# Patient Record
Sex: Male | Born: 1955
Health system: Southern US, Community
[De-identification: ages and names within clinical notes are randomized; demographics above are authoritative.]

## PROBLEM LIST (undated history)

## (undated) DIAGNOSIS — T7840XA Allergy, unspecified, initial encounter: Secondary | ICD-10-CM

## (undated) DIAGNOSIS — I10 Essential (primary) hypertension: Secondary | ICD-10-CM

## (undated) DIAGNOSIS — N400 Enlarged prostate without lower urinary tract symptoms: Secondary | ICD-10-CM

## (undated) DIAGNOSIS — IMO0001 Reserved for inherently not codable concepts without codable children: Secondary | ICD-10-CM

## (undated) DIAGNOSIS — M1990 Unspecified osteoarthritis, unspecified site: Secondary | ICD-10-CM

## (undated) DIAGNOSIS — G8929 Other chronic pain: Secondary | ICD-10-CM

## (undated) DIAGNOSIS — Z794 Long term (current) use of insulin: Secondary | ICD-10-CM

## (undated) DIAGNOSIS — E119 Type 2 diabetes mellitus without complications: Secondary | ICD-10-CM

## (undated) DIAGNOSIS — M549 Dorsalgia, unspecified: Secondary | ICD-10-CM

## (undated) DIAGNOSIS — M65312 Trigger thumb, left thumb: Secondary | ICD-10-CM

## (undated) DIAGNOSIS — E785 Hyperlipidemia, unspecified: Secondary | ICD-10-CM

## (undated) HISTORY — PX: LUMBAR LAMINECTOMY: SHX95

## (undated) HISTORY — DX: Allergy, unspecified, initial encounter: T78.40XA

## (undated) HISTORY — DX: Dorsalgia, unspecified: M54.9

## (undated) HISTORY — DX: Benign prostatic hyperplasia without lower urinary tract symptoms: N40.0

## (undated) HISTORY — PX: INGUINAL HERNIA REPAIR: SHX194

## (undated) HISTORY — DX: Essential (primary) hypertension: I10

## (undated) HISTORY — DX: Hyperlipidemia, unspecified: E78.5

## (undated) HISTORY — DX: Other chronic pain: G89.29

## (undated) HISTORY — PX: POLYPECTOMY: SHX149

---

## 1999-08-29 ENCOUNTER — Emergency Department (HOSPITAL_COMMUNITY): Admission: EM | Admit: 1999-08-29 | Discharge: 1999-08-29 | Payer: Self-pay | Admitting: Emergency Medicine

## 1999-08-29 ENCOUNTER — Encounter: Payer: Self-pay | Admitting: Emergency Medicine

## 2002-12-07 ENCOUNTER — Emergency Department (HOSPITAL_COMMUNITY): Admission: EM | Admit: 2002-12-07 | Discharge: 2002-12-07 | Payer: Self-pay | Admitting: Emergency Medicine

## 2004-12-03 ENCOUNTER — Ambulatory Visit: Payer: Self-pay | Admitting: Internal Medicine

## 2005-07-19 DIAGNOSIS — I10 Essential (primary) hypertension: Secondary | ICD-10-CM

## 2005-07-19 HISTORY — DX: Essential (primary) hypertension: I10

## 2006-04-04 ENCOUNTER — Ambulatory Visit: Payer: Self-pay | Admitting: Internal Medicine

## 2006-06-19 HISTORY — PX: COLONOSCOPY: SHX174

## 2006-06-19 LAB — HM COLONOSCOPY: HM Colonoscopy: 4

## 2006-07-01 ENCOUNTER — Ambulatory Visit: Payer: Self-pay | Admitting: Internal Medicine

## 2006-07-04 ENCOUNTER — Ambulatory Visit: Payer: Self-pay | Admitting: Internal Medicine

## 2006-07-04 LAB — CONVERTED CEMR LAB
ALT: 123 units/L — ABNORMAL HIGH (ref 0–40)
AST: 50 units/L — ABNORMAL HIGH (ref 0–37)
Albumin: 3.8 g/dL (ref 3.5–5.2)
Alkaline Phosphatase: 61 units/L (ref 39–117)
BUN: 11 mg/dL (ref 6–23)
Basophils Absolute: 0 10*3/uL (ref 0.0–0.1)
Basophils Relative: 0.2 % (ref 0.0–1.0)
CO2: 25 meq/L (ref 19–32)
Calcium: 9.2 mg/dL (ref 8.4–10.5)
Chloride: 103 meq/L (ref 96–112)
Chol/HDL Ratio, serum: 9.7
Cholesterol: 221 mg/dL (ref 0–200)
Creatinine, Ser: 1 mg/dL (ref 0.4–1.5)
Eosinophil percent: 3.3 % (ref 0.0–5.0)
GFR calc non Af Amer: 84 mL/min
Glomerular Filtration Rate, Af Am: 101 mL/min/{1.73_m2}
Glucose, Bld: 161 mg/dL — ABNORMAL HIGH (ref 70–99)
HCT: 53.9 % — ABNORMAL HIGH (ref 39.0–52.0)
HDL: 22.8 mg/dL — ABNORMAL LOW (ref >39.0)
Hemoglobin: 17.9 g/dL — ABNORMAL HIGH (ref 13.0–17.0)
Hgb A1c MFr Bld: 7.9 % — ABNORMAL HIGH (ref 4.6–6.0)
LDL DIRECT: 167.5 mg/dL
Lymphocytes Relative: 20.7 % (ref 12.0–46.0)
MCHC: 33.3 g/dL (ref 30.0–36.0)
MCV: 90.5 fL (ref 78.0–100.0)
Monocytes Absolute: 0.5 10*3/uL (ref 0.2–0.7)
Monocytes Relative: 5.7 % (ref 3.0–11.0)
Neutro Abs: 5.6 10*3/uL (ref 1.4–7.7)
Neutrophils Relative %: 70.1 % (ref 43.0–77.0)
Platelets: 249 10*3/uL (ref 150–400)
Potassium: 4.1 meq/L (ref 3.5–5.1)
RBC: 5.96 M/uL — ABNORMAL HIGH (ref 4.22–5.81)
RDW: 12.4 % (ref 11.5–14.6)
Sodium: 136 meq/L (ref 135–145)
TSH: 1.81 microintl units/mL (ref 0.35–5.50)
Total Bilirubin: 0.8 mg/dL (ref 0.3–1.2)
Total Protein: 7.1 g/dL (ref 6.0–8.3)
Triglyceride fasting, serum: 225 mg/dL (ref 0–149)
VLDL: 45 mg/dL — ABNORMAL HIGH (ref 0–40)
WBC: 8.1 10*3/uL (ref 4.5–10.5)

## 2006-07-26 ENCOUNTER — Ambulatory Visit: Payer: Self-pay

## 2006-08-15 ENCOUNTER — Ambulatory Visit: Payer: Self-pay | Admitting: Internal Medicine

## 2006-09-15 ENCOUNTER — Ambulatory Visit: Payer: Self-pay | Admitting: Internal Medicine

## 2006-09-30 ENCOUNTER — Ambulatory Visit: Payer: Self-pay | Admitting: Internal Medicine

## 2006-09-30 LAB — CONVERTED CEMR LAB
BUN: 19 mg/dL (ref 6–23)
CO2: 21 meq/L (ref 19–32)
Calcium: 9.4 mg/dL (ref 8.4–10.5)
Chloride: 105 meq/L (ref 96–112)
Creatinine, Ser: 1.09 mg/dL (ref 0.40–1.50)
Glucose, Bld: 97 mg/dL (ref 70–99)
Hgb A1c MFr Bld: 6.9 % — ABNORMAL HIGH (ref 4.6–6.1)
Potassium: 4.2 meq/L (ref 3.5–5.3)
Sodium: 141 meq/L (ref 135–145)

## 2007-10-30 ENCOUNTER — Encounter: Payer: Self-pay | Admitting: Internal Medicine

## 2007-11-21 ENCOUNTER — Ambulatory Visit: Payer: Self-pay | Admitting: Internal Medicine

## 2007-11-21 DIAGNOSIS — E7210 Disorders of sulfur-bearing amino-acid metabolism, unspecified: Secondary | ICD-10-CM

## 2007-11-21 DIAGNOSIS — I10 Essential (primary) hypertension: Secondary | ICD-10-CM | POA: Insufficient documentation

## 2007-11-21 DIAGNOSIS — E119 Type 2 diabetes mellitus without complications: Secondary | ICD-10-CM | POA: Insufficient documentation

## 2007-11-21 HISTORY — DX: Disorders of sulfur-bearing amino-acid metabolism, unspecified: E72.10

## 2007-11-21 LAB — CONVERTED CEMR LAB
Bilirubin Urine: NEGATIVE
Blood in Urine, dipstick: NEGATIVE
Glucose, Urine, Semiquant: >=1000
Ketones, urine, test strip: NEGATIVE
Nitrite: NEGATIVE
Protein, U semiquant: NEGATIVE
Specific Gravity, Urine: 1.01
Urobilinogen, UA: 0.2
WBC Urine, dipstick: NEGATIVE
pH: 5

## 2007-11-28 ENCOUNTER — Encounter (INDEPENDENT_AMBULATORY_CARE_PROVIDER_SITE_OTHER): Payer: Self-pay | Admitting: *Deleted

## 2007-11-28 LAB — CONVERTED CEMR LAB
ALT: 58 units/L — ABNORMAL HIGH (ref 0–53)
AST: 32 units/L (ref 0–37)
BUN: 19 mg/dL (ref 6–23)
Basophils Absolute: 0.1 10*3/uL (ref 0.0–0.1)
Basophils Relative: 0.9 % (ref 0.0–1.0)
CO2: 27 meq/L (ref 19–32)
Calcium: 9.7 mg/dL (ref 8.4–10.5)
Chloride: 103 meq/L (ref 96–112)
Creatinine, Ser: 0.8 mg/dL (ref 0.4–1.5)
Creatinine,U: 272.8 mg/dL
Eosinophils Absolute: 0.3 10*3/uL (ref 0.0–0.7)
Eosinophils Relative: 4.2 % (ref 0.0–5.0)
GFR calc Af Amer: 131 mL/min
GFR calc non Af Amer: 108 mL/min
Glucose, Bld: 178 mg/dL — ABNORMAL HIGH (ref 70–99)
HCT: 45.6 % (ref 39.0–52.0)
Hemoglobin: 16.2 g/dL (ref 13.0–17.0)
Hgb A1c MFr Bld: 9.9 % — ABNORMAL HIGH (ref 4.6–6.0)
Lymphocytes Relative: 34.4 % (ref 12.0–46.0)
MCHC: 35.4 g/dL (ref 30.0–36.0)
MCV: 87.7 fL (ref 78.0–100.0)
Microalb Creat Ratio: 21.3 mg/g (ref 0.0–30.0)
Microalb, Ur: 5.8 mg/dL — ABNORMAL HIGH (ref 0.0–1.9)
Monocytes Absolute: 0.7 10*3/uL (ref 0.1–1.0)
Monocytes Relative: 10.5 % (ref 3.0–12.0)
Neutro Abs: 3.4 10*3/uL (ref 1.4–7.7)
Neutrophils Relative %: 50 % (ref 43.0–77.0)
PSA: 0.4 ng/mL (ref 0.10–4.00)
Platelets: 226 10*3/uL (ref 150–400)
Potassium: 4 meq/L (ref 3.5–5.1)
RBC: 5.2 M/uL (ref 4.22–5.81)
RDW: 12.4 % (ref 11.5–14.6)
Sodium: 138 meq/L (ref 135–145)
TSH: 1.16 microintl units/mL (ref 0.35–5.50)
WBC: 6.9 10*3/uL (ref 4.5–10.5)

## 2007-12-05 ENCOUNTER — Telehealth (INDEPENDENT_AMBULATORY_CARE_PROVIDER_SITE_OTHER): Payer: Self-pay | Admitting: *Deleted

## 2008-02-20 ENCOUNTER — Ambulatory Visit: Payer: Self-pay | Admitting: Internal Medicine

## 2008-02-20 DIAGNOSIS — F17200 Nicotine dependence, unspecified, uncomplicated: Secondary | ICD-10-CM | POA: Insufficient documentation

## 2008-02-20 DIAGNOSIS — N400 Enlarged prostate without lower urinary tract symptoms: Secondary | ICD-10-CM | POA: Insufficient documentation

## 2008-02-22 ENCOUNTER — Encounter (INDEPENDENT_AMBULATORY_CARE_PROVIDER_SITE_OTHER): Payer: Self-pay | Admitting: *Deleted

## 2008-02-22 LAB — CONVERTED CEMR LAB
ALT: 34 units/L (ref 0–53)
AST: 22 units/L (ref 0–37)
BUN: 18 mg/dL (ref 6–23)
Creatinine, Ser: 1.1 mg/dL (ref 0.4–1.5)
Hgb A1c MFr Bld: 7.5 % — ABNORMAL HIGH (ref 4.6–6.0)

## 2008-10-11 ENCOUNTER — Encounter (INDEPENDENT_AMBULATORY_CARE_PROVIDER_SITE_OTHER): Payer: Self-pay | Admitting: *Deleted

## 2008-10-24 ENCOUNTER — Ambulatory Visit: Payer: Self-pay | Admitting: Internal Medicine

## 2008-12-27 ENCOUNTER — Ambulatory Visit: Payer: Self-pay | Admitting: Internal Medicine

## 2008-12-31 ENCOUNTER — Telehealth (INDEPENDENT_AMBULATORY_CARE_PROVIDER_SITE_OTHER): Payer: Self-pay | Admitting: *Deleted

## 2008-12-31 LAB — CONVERTED CEMR LAB
ALT: 46 units/L (ref 0–53)
AST: 30 units/L (ref 0–37)
BUN: 15 mg/dL (ref 6–23)
CO2: 26 meq/L (ref 19–32)
Calcium: 9.2 mg/dL (ref 8.4–10.5)
Chloride: 107 meq/L (ref 96–112)
Cholesterol: 162 mg/dL (ref 0–200)
Creatinine, Ser: 1 mg/dL (ref 0.4–1.5)
Direct LDL: 96.1 mg/dL
GFR calc non Af Amer: 82.76 mL/min (ref >60)
Glucose, Bld: 152 mg/dL — ABNORMAL HIGH (ref 70–99)
HDL: 22.5 mg/dL — ABNORMAL LOW (ref >39.00)
Hgb A1c MFr Bld: 6.9 % — ABNORMAL HIGH (ref 4.6–6.5)
Potassium: 3.7 meq/L (ref 3.5–5.1)
Sodium: 138 meq/L (ref 135–145)
Total CHOL/HDL Ratio: 7
Triglycerides: 337 mg/dL — ABNORMAL HIGH (ref 0.0–149.0)
VLDL: 67.4 mg/dL — ABNORMAL HIGH (ref 0.0–40.0)

## 2009-02-03 ENCOUNTER — Telehealth (INDEPENDENT_AMBULATORY_CARE_PROVIDER_SITE_OTHER): Payer: Self-pay | Admitting: *Deleted

## 2009-06-16 ENCOUNTER — Encounter (INDEPENDENT_AMBULATORY_CARE_PROVIDER_SITE_OTHER): Payer: Self-pay | Admitting: *Deleted

## 2009-10-27 ENCOUNTER — Encounter: Payer: Self-pay | Admitting: Internal Medicine

## 2009-10-28 ENCOUNTER — Ambulatory Visit: Payer: Self-pay | Admitting: Internal Medicine

## 2009-10-28 LAB — CONVERTED CEMR LAB
Bilirubin Urine: NEGATIVE
Blood in Urine, dipstick: NEGATIVE
Glucose, Urine, Semiquant: >=1000
Ketones, urine, test strip: NEGATIVE
Nitrite: NEGATIVE
Protein, U semiquant: NEGATIVE
Specific Gravity, Urine: >=1.03
Urobilinogen, UA: 0.2
WBC Urine, dipstick: NEGATIVE
pH: 5

## 2009-10-30 ENCOUNTER — Telehealth (INDEPENDENT_AMBULATORY_CARE_PROVIDER_SITE_OTHER): Payer: Self-pay | Admitting: *Deleted

## 2009-11-06 LAB — CONVERTED CEMR LAB
BUN: 19 mg/dL (ref 6–23)
Basophils Absolute: 0 10*3/uL (ref 0.0–0.1)
Basophils Relative: 0.3 % (ref 0.0–3.0)
CO2: 29 meq/L (ref 19–32)
Calcium: 10.4 mg/dL (ref 8.4–10.5)
Chloride: 103 meq/L (ref 96–112)
Creatinine, Ser: 1 mg/dL (ref 0.4–1.5)
Eosinophils Absolute: 0.2 10*3/uL (ref 0.0–0.7)
Eosinophils Relative: 4.6 % (ref 0.0–5.0)
GFR calc non Af Amer: 82.51 mL/min (ref >60)
Glucose, Bld: 181 mg/dL — ABNORMAL HIGH (ref 70–99)
HCT: 49.2 % (ref 39.0–52.0)
Hemoglobin: 17.1 g/dL — ABNORMAL HIGH (ref 13.0–17.0)
Hgb A1c MFr Bld: 7.2 % — ABNORMAL HIGH (ref 4.6–6.5)
Lymphocytes Relative: 42.5 % (ref 12.0–46.0)
Lymphs Abs: 2.1 10*3/uL (ref 0.7–4.0)
MCHC: 34.7 g/dL (ref 30.0–36.0)
MCV: 88.3 fL (ref 78.0–100.0)
Monocytes Absolute: 0.5 10*3/uL (ref 0.1–1.0)
Monocytes Relative: 10.7 % (ref 3.0–12.0)
Neutro Abs: 2.1 10*3/uL (ref 1.4–7.7)
Neutrophils Relative %: 41.9 % — ABNORMAL LOW (ref 43.0–77.0)
PSA: 0.77 ng/mL (ref 0.10–4.00)
Platelets: 255 10*3/uL (ref 150.0–400.0)
Potassium: 4.3 meq/L (ref 3.5–5.1)
RBC: 5.57 M/uL (ref 4.22–5.81)
RDW: 13.7 % (ref 11.5–14.6)
Sodium: 140 meq/L (ref 135–145)
TSH: 1.37 microintl units/mL (ref 0.35–5.50)
WBC: 5 10*3/uL (ref 4.5–10.5)

## 2009-11-12 ENCOUNTER — Telehealth (INDEPENDENT_AMBULATORY_CARE_PROVIDER_SITE_OTHER): Payer: Self-pay | Admitting: *Deleted

## 2009-12-02 ENCOUNTER — Encounter: Payer: Self-pay | Admitting: Internal Medicine

## 2009-12-04 ENCOUNTER — Telehealth: Payer: Self-pay | Admitting: Internal Medicine

## 2010-01-30 ENCOUNTER — Ambulatory Visit: Payer: Self-pay | Admitting: Internal Medicine

## 2010-02-02 LAB — CONVERTED CEMR LAB: Hgb A1c MFr Bld: 7.4 % — ABNORMAL HIGH (ref <5.7)

## 2010-05-05 ENCOUNTER — Telehealth (INDEPENDENT_AMBULATORY_CARE_PROVIDER_SITE_OTHER): Payer: Self-pay | Admitting: *Deleted

## 2010-05-13 ENCOUNTER — Ambulatory Visit: Payer: Self-pay | Admitting: Internal Medicine

## 2010-05-18 LAB — CONVERTED CEMR LAB
AST: 22 units/L (ref 0–37)
Hgb A1c MFr Bld: 6.7 % — ABNORMAL HIGH (ref 4.6–6.5)

## 2010-07-21 ENCOUNTER — Telehealth: Payer: Self-pay | Admitting: Internal Medicine

## 2010-08-18 NOTE — Letter (Signed)
Summary: [facility] --negative at exam for diabetes  [facility]   Imported By: Lanelle Bal 11/03/2009 11:00:11  _____________________________________________________________________  External Attachment:    Type:   Image     Comment:   External Document

## 2010-08-18 NOTE — Assessment & Plan Note (Signed)
Summary: cpx/kdc   Vital Signs:  Patient profile:   55 year old male Height:      71 inches Weight:      220.6 pounds BMI:     30.88 Pulse rate:   94 / minute BP sitting:   124 / 60  Vitals Entered By: Shary Decamp (October 28, 2009 1:43 PM) CC: cpx - not fasting Comments avg BS at home (not fasting) 170, BS this am was 240 (after eating frosted flakes) Shary Decamp  October 28, 2009 1:47 PM    History of Present Illness: cpx - not fasting  diabetes   avg BS at home (not fasting) 170, BS this am was 240 (after eating ) one time CBG was in the 70s (first time ever)  c/o   tingling in his feet, slightly  proximal from toes at the dorsum, no rash , not neccesarily worse at night   Preventive Screening-Counseling & Management  Alcohol-Tobacco     Alcohol type: socially     Packs/Day: <1  Caffeine-Diet-Exercise     Does Patient Exercise: no  Current Medications (verified): 1)  Viagra 100 Mg Tabs (Sildenafil Citrate) .... As Directed 2)  Metformin Hcl 1000 Mg Tabs (Metformin Hcl) .Marland Kitchen.. 1 By Mouth Two Times A Day - No Additional Refills Without Office Visit  Allergies (verified): No Known Drug Allergies  Past History:  Past Medical History: Hypertension---dx 2007 Diabetes mellitus, type II--Dx 2008 neuropathy  dx  09/2009 homocysteinemia 07-2006: neg. cardiolite (except for increase BP)  Past Surgical History: Reviewed history from 11/21/2007 and no changes required. Lumbar laminectomy x 2  Inguinal herniorrhaphy  Family History: Reviewed history from 11/21/2007 and no changes required. DM - F HTN - no CAD - no prostate Ca - no colon Ca - no M - deceased, breast Ca F - deceased, DM  Social History: Reviewed history from 12/27/2008 and no changes required. 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 CV:  Denies chest pain or discomfort and swelling of feet. Resp:  Denies cough and shortness of breath. GI:  Denies bloody stools, diarrhea, nausea, and vomiting. GU:  Denies dysuria and hematuria.  Physical Exam  General:  alert, well-developed, and overweight-appearing.   Neck:  no thyromegaly and normal carotid upstroke.   Lungs:  normal respiratory effort, no intercostal retractions, no accessory muscle use, and normal breath sounds.   Heart:  normal rate, regular rhythm, no murmur, and no gallop.   Abdomen:  soft, non-tender, no masses, no guarding, and no rigidity.   Rectal:  No external abnormalities noted. Normal sphincter tone. No rectal masses or tenderness. Prostate:  Prostate gland firm and smooth, no enlargement,  tenderness, mass, asymmetry or induration. 1mm nodule on the lateral R side? Pulses:  normal pedal pulses bilaterally  Extremities:  no pretibial edema bilaterally  Neurologic:  DTRs symmetrical and normal.    Diabetes Management Exam:    Foot Exam (with socks and/or shoes not present):       Sensory-Pinprick/Light touch:          Left medial foot (L-4): normal          Left dorsal foot (L-5): normal          Left lateral foot (S-1):  normal          Right medial foot (L-4): normal          Right dorsal foot (L-5): normal          Right lateral foot (S-1): normal       Sensory-Monofilament:          Left foot: normal          Right foot: normal       Inspection:          Left foot: normal          Right foot: normal       Nails:          Left foot: normal          Right foot: normal    Eye Exam:       Eye Exam done elsewhere          Date: 10/27/2009          Results: normal          Done by: Ginette Otto ophthalomogy   Impression & Recommendations:  Problem # 1:  HEALTH SCREENING (ICD-V70.0)  td 2004 Cscope 2007: 4 hyperplastic polyps, next 2017 question of prostate nodule on exam,  will reassess yearly  , low threshold to refer to urology if PSA is slightly  elevated diet-exercise-- discussed  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  printed material provided regards free seminars @ [facility]   Orders: Tobacco use cessation intermediate 3-10 minutes ([address])  Problem # 3:  DIABETES MELLITUS, TYPE II (ICD-250.00) reports he had a negative eye exam yesterday he has tingling in the toes consistent with peripheral neuropathy, likely from diabetes. Information from UTD regards neuropathy provided ; appropriate feet care discussed labs His updated medication list for this problem includes:    Metformin Hcl 1000 Mg Tabs (Metformin hcl) .Marland Kitchen... 1 by mouth two times a day - no additional refills without office visit  Orders: TLB-A1C / Hgb A1C (Glycohemoglobin) (83036-A1C) TLB-Microalbumin/Creat Ratio, Urine (82043-MALB)  Problem # 4:  HYPERTENSION (ICD-401.9) at goal  BP today: 124/60 Prior BP: 126/80 (12/27/2008)  Labs Reviewed: K+: 3.7 (12/27/2008) Creat: : 1.0 (12/27/2008)   Chol: 162 (12/27/2008)   HDL: 22.50 (12/27/2008)   LDL: DEL (07/04/2006)   TG: 337.0 (12/27/2008)  Orders: TLB-BMP (Basic Metabolic Panel-BMET) (80048-METABOL)  Complete Medication List: 1)  Viagra 100 Mg Tabs (Sildenafil citrate) .... As directed 2)  Metformin Hcl 1000 Mg Tabs (Metformin hcl) .Marland Kitchen.. 1 by mouth two times a day - no additional refills without office visit  Other Orders: UA Dipstick w/o Micro (manual) (09811)  \  Patient Instructions: 1)  Please schedule a follow-up appointment in 3 months .  Prescriptions: METFORMIN HCL 1000 MG TABS (METFORMIN HCL) 1 by mouth two times a day - NO  ADDITIONAL REFILLS WITHOUT OFFICE VISIT  #60 Tablet x 0   Entered by:   Shary Decamp   Authorized by:   Nolon Rod. Jeffery Bachmeier MD   Signed by:   Shary Decamp on 10/28/2009   Method used:   [facility]        [facility] [phone][facility]retail[facility]       [address]  [place], [place]  [address]       Ph: [phone] or [phone]       Fax: [phone]   RxID:   [id number]    Risk Factors:  Tobacco use:  current    Cigarettes:  Yes -- <1 pack(s) per day Alcohol use:  yes    Type:  socially Exercise:  no  Colonoscopy History:    Date of Last Colonoscopy:  06/19/2006    Preventive Care Screening  Prior Values:    PSA:  0.40 (11/21/2007)    Colonoscopy:  4 hyperplastic polyps - next cscope due 2017 (06/19/2006)    Last Tetanus Booster:  per pt (07/19/2002)  Laboratory Results   Urine Tests    Routine Urinalysis   Glucose: >=1000   (Normal Range: Negative) Bilirubin: negative   (Normal Range: Negative) Ketone: negative   (Normal Range: Negative) Spec. Gravity: >=1.030   (Normal Range: 1.003-1.035) Blood: negative   (Normal Range: Negative) pH: 5.0   (Normal Range: 5.0-8.0) Protein: negative   (Normal Range: Negative) Urobilinogen: 0.2   (Normal Range: 0-1) Nitrite: negative   (Normal Range: Negative) Leukocyte Esterace: negative   (Normal Range: Negative)

## 2010-08-18 NOTE — Progress Notes (Signed)
Summary: Actos- $$, start amaryl  Phone Note Outgoing Call   Summary of Call: again, a dose seems to be very expensive for the patient. Plan: stay on metformin 1000 mg, one by mouth twice a day Instead of Actos, Amaryl 2 mg one tablet daily, watch for low sugars, call if CBGs consistently below 100 Office visit --- 3 months Dailen Mcclish E. Jeson Camacho MD  Dec 04, 2009 10:43 AM  DISCUSSED WITH PT Shary Decamp  Dec 04, 2009 10:49 AM     New/Updated Medications: AMARYL 2 MG TABS (GLIMEPIRIDE) 1 by mouth once daily - DUE OFFICE VISIT 02/2010 Prescriptions: AMARYL 2 MG TABS (GLIMEPIRIDE) 1 by mouth once daily - DUE OFFICE VISIT 02/2010  #30 x 2   Entered by:   Shary Decamp   Authorized by:   Nolon Rod. Yavonne Kiss MD   Signed by:   Shary Decamp on 12/04/2009   Method used:   [facility]        [facility]  [place] (retail)       [address]       [place], [place]  [address]       Ph: [phone] or [phone]       Fax: [phone]   RxID:   [id number]

## 2010-08-18 NOTE — Letter (Signed)
Summary: Insurance underwriter Form   Imported By: Lanelle Bal 11/03/2009 10:56:15  _____________________________________________________________________  External Attachment:    Type:   Image     Comment:   External Document

## 2010-08-18 NOTE — Progress Notes (Signed)
Summary: RX --  Phone Note Refill Request Call back at [phone] Message from:  Patient on May 05, 2010 9:19 AM  Refills Requested: Medication #1:  METFORMIN HCL 1000 MG TABS 1 by mouth two times a day..   Dosage confirmed as above?Dosage Confirmed   Supply Requested: 3 months  Medication #2:  AMARYL 2 MG TABS 1/2 by mouth once daily   Dosage confirmed as above?Dosage Confirmed   Supp[facility] Requested: [facility] month [facility]  Initial call taken by: Freddy Jaksch,  May 05, 2010 9:19 AM    Prescriptions: METFORMIN HCL 1000 MG TABS (METFORMIN HCL) 1 by mouth two times a day.  #60 x 0   Entered by:   Army Fossa CMA   Authorized by:   Nolon Rod. Paz MD   Signed by:   Army Fossa CMA on 05/05/2010   Method used:   [facility]        [facility]  [place] (retail)       [address]       [place], [place]  [address]       Ph: [phone] or [phone]       Fax: [phone]   RxID:   [id number] AMARYL 2 MG TABS (GLIMEPIRIDE) 1/2 by mouth once daily  #15 x 0   Entered by:   Army Fossa CMA   Authorized by:   Nolon Rod. Paz MD   Signed by:   Army Fossa CMA on 05/05/2010   Method used:   [facility]        [facility]  [place] (retail)       [address]       [place], [place]  [address]       Ph: [phone] or [phone]       Fax: [phone]   RxID:   [id number]

## 2010-08-18 NOTE — Letter (Signed)
Summary: Fax from Patient Regarding Meds  Fax from Patient Regarding Meds   Imported By: Lanelle Bal 12/11/2009 12:22:21  _____________________________________________________________________  External Attachment:    Type:   Image     Comment:   External Document

## 2010-08-18 NOTE — Assessment & Plan Note (Signed)
Summary: 3 month followup/kn-rescd cbs   Vital Signs:  Patient profile:   55 year old male Weight:      226.25 pounds Pulse rate:   98 / minute Pulse rhythm:   regular BP sitting:   142 / 70  (left arm) Cuff size:   large  Vitals Entered By: Army Fossa CMA (May 13, 2010 1:17 PM) CC: Pt here for 3 month f/u- not fasting  Comments [facility] Declines flu shot    History of Present Illness: routine office visit Good compliance with medication as prescribed He has noted improvement on the CBGs   Current Medications (verified): 1)  Amaryl 2 Mg Tabs (Glimepiride) .... 1/2 By Mouth Once Daily 2)  Metformin Hcl 1000 Mg Tabs (Metformin Hcl) .Marland Kitchen.. 1 By Mouth Two Times A Day.  Allergies (verified): No Known Drug Allergies  Past History:  Past Medical History: Reviewed history from 10/28/2009 and no changes required. Hypertension---dx 2007 Diabetes mellitus, type II--Dx 2008 neuropathy  dx  09/2009 homocysteinemia 07-2006: neg. cardiolite (except for increase BP)  Past Surgical History: Reviewed history from 11/21/2007 and no changes required. Lumbar laminectomy x 2  Inguinal herniorrhaphy  Review of Systems       denies symptoms consistent with hypoglycemia A couple of times when he checked his sugar they were in the 70s but he was asymptomatic Admits to occasional numbness in the feet, right more than left, no burning sensation, symptoms are not worse at night. No rash   Physical Exam  General:  alert and well-developed.   Lungs:  normal respiratory effort, no intercostal retractions, no accessory muscle use, and normal breath sounds.   Heart:  normal rate, regular rhythm, no murmur, and no gallop.   Pulses:  normal pedal pulses bilaterally Extremities:  no lower extremity edema  Diabetes Management Exam:    Foot Exam (with socks and/or shoes not present):       Sensory-Pinprick/Light touch:          Left medial foot (L-4): normal          Left  dorsal foot (L-5): normal          Left lateral foot (S-1): normal          Right medial foot (L-4): normal          Right dorsal foot (L-5): normal          Right lateral foot (S-1): normal       Sensory-Monofilament:          Left foot: normal          Right foot: normal       Inspection:          Left foot: normal          Right foot: normal       Nails:          Left foot: too long          Right foot: too long   Impression & Recommendations:  Problem # 1:  DIABETES MELLITUS, TYPE II (ICD-250.00) feet care discussed mild numbness may-may not be related to DM ----> observation  doing well no change , will call if problems His updated medication list for this problem includes:    Amaryl 2 Mg Tabs (Glimepiride) .Marland Kitchen... 1/2 by mouth once daily    Metformin Hcl 1000 Mg Tabs (Metformin hcl) .Marland Kitchen... 1 by mouth two times a day.  Orders: Venipuncture ([address]) TLB-ALT (SGPT) (84460-ALT) TLB-AST (  SGOT) (84450-SGOT) TLB-A1C / Hgb A1C (Glycohemoglobin) (83036-A1C) Specimen Handling (045[address])  Problem # 2:  flu shot  reluctant to take, explained benefits. Decided to have it   Complete Medication List: 1)  Amaryl 2 Mg Tabs (Glimepiride) .... 1/2 by mouth once daily 2)  Metformin Hcl 1000 Mg Tabs (Metformin hcl) .Marland Kitchen.. 1 by mouth two times a day.  Other Orders: Admin 1st Vaccine (98119) Flu Vaccine 36yrs + (14[address])  Patient Instructions: 1)  Please schedule a follow-up appointment in 4 months .    Orders Added: 1)  Venipuncture [36415] 2)  TLB-ALT (SGPT) [84460-ALT] 3)  TLB-AST (SGOT) [84450-SGOT] 4)  TLB-A1C / Hgb A1C (Glycohemoglobin) [83036-A1C] 5)  Specimen Handling [99000] 6)  Admin 1st Vaccine [90471] 7)  Flu Vaccine 110yrs + [90658] 8)  Est. Patient Level III [95621] Flu Vaccine Consent Questions     Do you have a history of severe allergic reactions to this vaccine? no    Any prior history of allergic reactions to egg and/or gelatin? no    Do you have a sensitivity to the  preservative Thimersol? no    Do you have a past history of Guillan-Barre Syndrome? no    Do you currently have an acute febrile illness? no    Have you ever had a severe reaction to latex? no    Vaccine information given and explained to patient? yes    Are you currently pregnant? no    Lot Number:[id number]   Exp Date:01/16/2011   Site Given  right Deltoid IM 4)  TLB-A1C / Hgb A1C (Glycohemoglobin) [83036-A1C] 5)  Specimen Handling [99000] 6)  Admin 1st Vaccine [90471] 7)  Flu Vaccine 34yrs + [30865]   .lbflu1

## 2010-08-18 NOTE — Progress Notes (Signed)
Summary: LEFT MSG FOR PT TO CALL 2 x pt informed  Phone Note Outgoing Call   Call placed by: Kandice Hams,  October 30, 2009 4:03 PM Call placed to: Patient [phone] Summary of Call: left msg for pt to call re LABWORK.  SAMPLES UP FRONT Left msg for pt to call .Kandice Hams  October 31, 2009 9:39 AM  Initial call taken by: Kandice Hams,  October 30, 2009 4:03 PM  Follow-up for Phone Call        Spoke  with pt informed of Dr Drue Novel recommendations samples up fromt and rx [facility] [place].  Pt is  out of town rigt now will pick up next week .Kandice Hams  October 31, 2009 10:14 AM  Follow-up by: Kandice Hams,  October 31, 2009 10:14 AM

## 2010-08-18 NOTE — Progress Notes (Signed)
Summary: med to expensive  Phone Note Call from Patient Call back at [phone]   Caller: Patient Summary of Call: pt states that Actoplusmet XR 15-1000mg   is to expensive at $117. pt would like a med that has a generic and is less expensive. pt uses [facility] [place]. pls advise..............Marland KitchenFelecia Deloach CMA  November 12, 2009 11:59 AM   Follow-up for Phone Call        see if actos 30mg  1 a day and metformin 1000mg  two times a day is less expensive or do we have a cupon? Jose E. Paz MD  November 13, 2009 1:20 PM    Additional Follow-up for Phone Call Additional follow up Details #1::        Spoke with patient, patient would like to have meds called in and if too expensive patient was informed to contact his insurance company and request a copy of his formulary and schedule a follow-up appointment with Dr.Paz to discuss changing med. Patient agreed. Additional Follow-up by: Shonna Chock,  November 13, 2009 2:22 PM    New/Updated Medications: ACTOS 30 MG TABS (PIOGLITAZONE HCL) 1 by mouth once daily METFORMIN HCL 1000 MG TABS (METFORMIN HCL) 2 by mouth two times a day Prescriptions: METFORMIN HCL 1000 MG TABS (METFORMIN HCL) 2 by mouth two times a day  #60 x 2   Entered by:   Shonna Chock   Authorized by:   Nolon Rod. Paz MD   Signed by:   Shonna Chock on 11/13/2009   Method used:   [facility]        [facility]  [place] (retail)       [address]       [place], [place]  [address]       Ph: [phone] or [phone]       Fax: [phone]   RxID:   [id number] ACTOS 30 MG TABS (PIOGLITAZONE HCL) 1 by mouth once daily  #30 x 2   Entered by:   Shonna Chock   Authorized by:   Nolon Rod. Paz MD   Signed by:   Shonna Chock on 11/13/2009   Method used:   Electronically to        [facility]  [place] (retail)       [address]       [place], [place]  [address]       Ph: [phone] or [phone]       Fax: [phone]   RxID:   [id number]

## 2010-08-18 NOTE — Assessment & Plan Note (Signed)
Summary: ROA RECHECK ON SUGAR//PH   Vital Signs:  Patient profile:   55 year old male Weight:      225 pounds Pulse rate:   104 / minute Pulse rhythm:   regular BP sitting:   126 / 82  (left arm) Cuff size:   large  Vitals Entered By: Army Fossa CMA (January 30, 2010 2:51 PM)  Past History:  Past Medical History: Reviewed history from 10/28/2009 and no changes required. Hypertension---dx 2007 Diabetes mellitus, type II--Dx 2008 neuropathy  dx  09/2009 homocysteinemia 07-2006: neg. cardiolite (except for increase BP)  Past Surgical History: Reviewed history from 11/21/2007 and no changes required. Lumbar laminectomy x 2  Inguinal herniorrhaphy  History of Present Illness: ROV Diabetes--was recommended to start Actos, d/t cost he is switched to Amaryl, unfortunately in the process, he also quit metformin overall feels well His sugars depend a lot on his diet, if his diet is reasonable his sugars are good. Had a single episode of low sugar, he felt sweaty and he agreed, his CBC was 60 and his symptoms resolved with sugar  ROS Denies chest pain or shortness of breath No edema No lower extremity paresthesias     Physical Exam  General:  alert and well-developed.   Lungs:  normal respiratory effort, no intercostal retractions, no accessory muscle use, and normal breath sounds.   Heart:  normal rate, regular rhythm, no murmur, and no gallop.   Extremities:  no edema    Impression & Recommendations:  Problem # 1:  DIABETES MELLITUS, TYPE II (ICD-250.00)  based on the last A1c we added Actos, he couldn't afford it Then we  recommend to add Amaryl which is doing but unfortunately he also stopped metformin (a misunderstanding) With glimepiride only  his sugars are okay as long as he eats well---> I did reinforce to the patient the need to eat healthy Had a single episode of symptomatic low sugar Plan: Labs May need to add metformin, patient aware will call if  further low sugar symptoms  The following medications were removed from the medication list:    Metformin Hcl 1000 Mg Tabs (Metformin hcl) .Marland Kitchen... 2 by mouth two times a day His updated medication list for this problem includes:    Amaryl 2 Mg Tabs (Glimepiride) .Marland Kitchen... 1 by mouth once daily  Orders: Venipuncture ([address]) Specimen Handling ([address])  Complete Medication List: 1)  Amaryl 2 Mg Tabs (Glimepiride) .Marland Kitchen.. 1 by mouth once daily Prescriptions: AMARYL 2 MG TABS (GLIMEPIRIDE) 1 by mouth once daily  #90 x 1   Entered and Authorized by:   Elita Quick E. Dolan Xia MD   Signed by:   Nolon Rod. Boleslaus Holloway MD on 01/30/2010   Method used:   Print then Give to Patient   RxID:   [id number]

## 2010-08-20 NOTE — Progress Notes (Signed)
Summary: Refill Request  Phone Note Refill Request Call back at [phone] Message from:  Pharmacy on July 21, 2010 11:09 AM  Refills Requested: Medication #1:  Hydrocodone-Homatropione Syrup   Supply Requested:   [facility] [address] [facility] [place]  Next Appointment Scheduled: 2.24.12 Initial call taken by: Harold Barban,  July 21, 2010 11:10 AM Caller: Patient Summary of Call: PT CALLED AND STATES THAT HE CALLED IN A RX [facility]OCODINE [facility]. PLEASE CONTACT PATIENT AT [phone]. I TOLD HIM THAT HE WOULD MORE THAN LIKELY HAVE TO COME IN FOR AN OV BUT HE SAYS " WELL THEY'RE NOT GOING TO BE ABLE TO SEE ME BECAUSE IM GOING OUT OF TOWN FOR A WEEK" Initial call taken by: Lavell Islam,  July 21, 2010 10:33 AM  Follow-up for Phone Call        for  cough, try Robitussin-DM first, then if no improvement he can use the hydrocodone syrup. ok to call 150 cc, NO RF. Same directions as before. Advised patient that this will make him sleepy , he needs to be careful. If the cough persists--->  office visit Follow-up by: [facility] E. Natividad Halls MD,  July 22, 2010 8:09 AM  Additional Follow-up for Phone Call Additional follow up Details #1::        Left message for pt to call back, will send in meds once I speak w/ pt. Army Fossa CMA  July 22, 2010 8:29 AM     Additional Follow-up for Phone Call Additional follow up Details #2::    I spoke w/ pt he states that he has the Robitussion DM. Called in the Hydrocodone syrup- pt aware it will make him sleepy. Army Fossa CMA  July 23, 2010 1:53 PM   New/Updated Medications: HYDROCODONE-HOMATROPINE 5-1.5 MG/5ML SYRP (HYDROCODONE-HOMATROPINE) 5cc by mouth every 6hrs as needed for cough. (Prescriptions: HYDROCODONE-HOMATROPINE 5-1.5 MG/5ML SYRP (HYDROCODONE-HOMATROPINE) 5cc by mouth every 6hrs as needed for cough.  #150cc x 0   Entered by:   Army Fossa CMA   Authorized by:    Nolon Rod. Azilee Pirro MD   Signed by:   Army Fossa CMA on 07/23/2010   Method used:   Print then Give to Patient   RxID:   [id number]  sent to [facility] in [facility], did not give to pt. Army Fossa CMA  July 23, 2010 1:53 PM

## 2010-09-11 ENCOUNTER — Ambulatory Visit: Payer: Self-pay | Admitting: Internal Medicine

## 2010-09-17 ENCOUNTER — Encounter: Payer: Self-pay | Admitting: Internal Medicine

## 2010-10-18 ENCOUNTER — Other Ambulatory Visit: Payer: Self-pay | Admitting: Internal Medicine

## 2010-10-19 NOTE — Telephone Encounter (Signed)
Okay one month supply  And 4 refills, also to tell  patient he is due for a office visit

## 2010-10-22 ENCOUNTER — Ambulatory Visit: Payer: Self-pay | Admitting: Internal Medicine

## 2010-10-24 ENCOUNTER — Other Ambulatory Visit: Payer: Self-pay | Admitting: Internal Medicine

## 2010-10-26 ENCOUNTER — Other Ambulatory Visit: Payer: Self-pay | Admitting: Internal Medicine

## 2010-10-26 MED ORDER — METFORMIN HCL 1000 MG PO TABS: 1000.0000 mg | ORAL_TABLET | Freq: Two times a day (BID) | ORAL | Status: DC

## 2010-10-26 NOTE — Telephone Encounter (Signed)
Patient says he has called in a prescription for Metformin --30 day supply---wants to know if Dr Drue Novel will change it  to a 90 day supply??   (for [facility], [place], [place] area)

## 2010-10-30 ENCOUNTER — Encounter: Payer: Self-pay | Admitting: Internal Medicine

## 2010-11-09 ENCOUNTER — Encounter: Payer: Self-pay | Admitting: Internal Medicine

## 2010-11-09 ENCOUNTER — Ambulatory Visit (INDEPENDENT_AMBULATORY_CARE_PROVIDER_SITE_OTHER): Payer: BC Managed Care – PPO | Admitting: Internal Medicine

## 2010-11-09 VITALS — BP 132/82 | HR 105 | Ht 71.0 in | Wt 225.4 lb

## 2010-11-09 DIAGNOSIS — I10 Essential (primary) hypertension: Secondary | ICD-10-CM

## 2010-11-09 DIAGNOSIS — Z0000 Encounter for general adult medical examination without abnormal findings: Secondary | ICD-10-CM

## 2010-11-09 DIAGNOSIS — E119 Type 2 diabetes mellitus without complications: Secondary | ICD-10-CM

## 2010-11-09 LAB — POCT URINALYSIS DIPSTICK
Bilirubin, UA: NEGATIVE
Blood, UA: NEGATIVE
Ketones, UA: NEGATIVE
Leukocytes, UA: NEGATIVE
Protein, UA: NEGATIVE
pH, UA: 5

## 2010-11-09 MED ORDER — METFORMIN HCL 1000 MG PO TABS: 1000.0000 mg | ORAL_TABLET | Freq: Two times a day (BID) | ORAL | Status: DC

## 2010-11-09 MED ORDER — GLIMEPIRIDE 2 MG PO TABS: ORAL_TABLET | ORAL | Status: DC

## 2010-11-09 NOTE — Assessment & Plan Note (Signed)
Good medication compliance. Labs

## 2010-11-09 NOTE — Assessment & Plan Note (Addendum)
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

## 2010-11-09 NOTE — Assessment & Plan Note (Signed)
On no medicines 

## 2010-11-09 NOTE — Progress Notes (Signed)
  Subjective:    Patient ID: Christian Petty, male    DOB: 01-28-1956, 55 y.o.   MRN: [id number]  HPI CPX In addition, needs DOT DM--  good medication compliance, does not check his CBGs regularly. HTN-- no ambulatory BPs, not on meds      Past Medical History  Diagnosis Date  . Neuropathy 09/2009  . HTN (hypertension) 2007  . Diabetes mellitus 2008  . Chest pain     (-) cardiolite  07-2006    Past Surgical History  Procedure Date  . Lumbar laminectomy     x2  . Inguinal hernia repair    Family History  Problem Relation Age of Onset  . Diabetes Father   . Breast cancer Mother   . Hypertension Neg Hx   . Prostate cancer Neg Hx   . Colon cancer Neg Hx   . Coronary artery disease Neg Hx   . Stroke  68    F   History   Social History  . Marital Status: Married    Spouse Name: N/A    Number of Children: 2  . Years of Education: N/A   Occupational History  . truck driver     Social History Main Topics  . Smoking status: Current Everyday Smoker -- 1.0 packs/day  . Smokeless tobacco: Not on file  . Alcohol Use: Yes     socially   . Drug Use: No  . Sexually Active: Not on file   Other Topics Concern  . 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 Does see the dentist routinely Sees the eye doctor routinely as well.     Objective:   Physical Exam  Constitutional: He is oriented to person, place, and time. He appears well-developed and well-nourished. No distress.  HENT:  Head: Normocephalic and atraumatic.  Right Ear: External ear normal.  Left Ear: External ear normal.  Mouth/Throat: Oropharynx is clear and moist. No oropharyngeal exudate.  Eyes: No scleral icterus.  Neck: Normal  range of motion. Neck supple. No thyromegaly present.  Cardiovascular: Normal rate, regular rhythm and normal heart sounds.   No murmur heard. Pulmonary/Chest: Effort normal and breath sounds normal. No respiratory distress. He has no wheezes. He has no rales.  Abdominal: Bowel sounds are normal. He exhibits no distension. There is no tenderness. There is no rebound and no guarding.       No bruit  Genitourinary: Rectum normal and prostate normal. Guaiac negative stool.  Musculoskeletal:       Diabetes feet exam: No edema, normal pedal pulses bilaterally. Pinprick examination normal. No skin problems noted.  Neurological: He is oriented to person, place, and time.  Skin: Skin is warm and dry. He is not diaphoretic.  Psychiatric: He has a normal mood and affect. His behavior is normal. Thought content normal.          Assessment & Plan:

## 2010-11-10 LAB — BASIC METABOLIC PANEL
CO2: 23 mEq/L (ref 19–32)
Calcium: 10 mg/dL (ref 8.4–10.5)
Chloride: 105 mEq/L (ref 96–112)
Creatinine, Ser: 1.2 mg/dL (ref 0.4–1.5)
Glucose, Bld: 82 mg/dL (ref 70–99)

## 2010-11-10 LAB — CBC WITH DIFFERENTIAL/PLATELET
Basophils Relative: 0.4 % (ref 0.0–3.0)
Eosinophils Relative: 4.7 % (ref 0.0–5.0)
HCT: 47.3 % (ref 39.0–52.0)
Hemoglobin: 16.3 g/dL (ref 13.0–17.0)
Lymphs Abs: 3 10*3/uL (ref 0.7–4.0)
MCV: 87.8 fl (ref 78.0–100.0)
Monocytes Absolute: 0.7 10*3/uL (ref 0.1–1.0)
RBC: 5.38 Mil/uL (ref 4.22–5.81)
WBC: 7.3 10*3/uL (ref 4.5–10.5)

## 2010-11-10 LAB — ALT: ALT: 47 U/L (ref 0–53)

## 2010-11-10 LAB — AST: AST: 25 U/L (ref 0–37)

## 2010-11-10 LAB — MICROALBUMIN / CREATININE URINE RATIO: Microalb Creat Ratio: 0.6 mg/g (ref 0.0–30.0)

## 2010-11-10 LAB — TSH: TSH: 1.27 u[IU]/mL (ref 0.35–5.50)

## 2010-11-10 LAB — LDL CHOLESTEROL, DIRECT: Direct LDL: 114.1 mg/dL

## 2010-11-11 ENCOUNTER — Telehealth: Payer: Self-pay | Admitting: *Deleted

## 2010-11-11 MED ORDER — PRAVASTATIN SODIUM 20 MG PO TABS: 20.0000 mg | ORAL_TABLET | Freq: Every evening | ORAL | Status: DC

## 2010-11-11 NOTE — Telephone Encounter (Signed)
Message copied by Army Fossa on Wed Nov 11, 2010  5:00 PM ------      Message from: Willow Ora      Created: Wed Nov 11, 2010  4:57 PM       Advise patient:      His diabetes needs better control. Increase Amaryl from half a tablet to one tablet daily.      His cholesterol used to be better, needs medication: Start pravachol 20 mg 1 by mouth at night,, #30, 6 RF. Watch for side effects mostly muscle aches.      Return to the office in 4 months as planned.

## 2010-11-11 NOTE — Telephone Encounter (Signed)
i hope so.

## 2010-11-19 ENCOUNTER — Other Ambulatory Visit: Payer: Self-pay | Admitting: Internal Medicine

## 2010-11-19 NOTE — Telephone Encounter (Signed)
Pharm did not receive

## 2010-12-09 ENCOUNTER — Other Ambulatory Visit: Payer: Self-pay

## 2010-12-09 MED ORDER — GLIMEPIRIDE 2 MG PO TABS: 2.0000 mg | ORAL_TABLET | Freq: Every day | ORAL | Status: DC

## 2011-03-19 ENCOUNTER — Other Ambulatory Visit: Payer: Self-pay | Admitting: Internal Medicine

## 2011-03-19 ENCOUNTER — Ambulatory Visit: Payer: BC Managed Care – PPO | Admitting: Internal Medicine

## 2011-04-25 ENCOUNTER — Other Ambulatory Visit: Payer: Self-pay | Admitting: Internal Medicine

## 2011-04-26 NOTE — Telephone Encounter (Signed)
Done

## 2011-06-10 ENCOUNTER — Other Ambulatory Visit: Payer: Self-pay | Admitting: Internal Medicine

## 2011-06-11 NOTE — Telephone Encounter (Signed)
Per Dr.Paz patient to follow-up in 4 months (that was noted in 10/2010, appointment was due 02/2011)

## 2011-06-27 ENCOUNTER — Other Ambulatory Visit: Payer: Self-pay | Admitting: Internal Medicine

## 2011-07-12 ENCOUNTER — Other Ambulatory Visit: Payer: Self-pay | Admitting: Internal Medicine

## 2011-08-09 ENCOUNTER — Other Ambulatory Visit: Payer: Self-pay | Admitting: Internal Medicine

## 2011-08-09 NOTE — Telephone Encounter (Signed)
A1C 250.00 

## 2011-09-08 ENCOUNTER — Other Ambulatory Visit: Payer: Self-pay | Admitting: Internal Medicine

## 2011-09-09 NOTE — Telephone Encounter (Signed)
Refill done.  

## 2011-10-01 ENCOUNTER — Other Ambulatory Visit: Payer: Self-pay | Admitting: Internal Medicine

## 2011-10-01 NOTE — Telephone Encounter (Signed)
Refill done.  

## 2011-10-06 ENCOUNTER — Telehealth: Payer: Self-pay | Admitting: Internal Medicine

## 2011-10-06 NOTE — Telephone Encounter (Signed)
Patient states he needs a DOT PE before 10-22-11 due to insurance lapsing. I did inform pt that his last PE was 11-09-11. Is there anywhere we can put him?

## 2011-10-06 NOTE — Telephone Encounter (Signed)
Will you call & schedule pt for next Tuesday at 8am if he can make it then if not, make 2 into . Thank you!

## 2011-10-06 NOTE — Telephone Encounter (Signed)
ok 

## 2011-10-06 NOTE — Telephone Encounter (Signed)
You have a opening at 8am Tuesday 3.26.13. That will you give you 4 CPE in the morning. Is that ok or make 2 appt a ?

## 2011-10-14 NOTE — Telephone Encounter (Signed)
Pt is scheduled for 10-19-11.

## 2011-10-19 ENCOUNTER — Ambulatory Visit (INDEPENDENT_AMBULATORY_CARE_PROVIDER_SITE_OTHER): Payer: BC Managed Care – PPO | Admitting: Internal Medicine

## 2011-10-19 ENCOUNTER — Encounter: Payer: Self-pay | Admitting: Internal Medicine

## 2011-10-19 DIAGNOSIS — F17200 Nicotine dependence, unspecified, uncomplicated: Secondary | ICD-10-CM

## 2011-10-19 DIAGNOSIS — E785 Hyperlipidemia, unspecified: Secondary | ICD-10-CM

## 2011-10-19 DIAGNOSIS — E119 Type 2 diabetes mellitus without complications: Secondary | ICD-10-CM

## 2011-10-19 DIAGNOSIS — Z0000 Encounter for general adult medical examination without abnormal findings: Secondary | ICD-10-CM

## 2011-10-19 LAB — POCT URINALYSIS DIPSTICK
Bilirubin, UA: NEGATIVE
Spec Grav, UA: 1.025
Urobilinogen, UA: 0.2

## 2011-10-19 NOTE — Assessment & Plan Note (Signed)
Was prescribed Pravachol based on the cholesterol panel from a year ago, reports good compliance w/ meds  despite the fact that we have not been refilling his medication enough. Labs.

## 2011-10-19 NOTE — Assessment & Plan Note (Signed)
Last ov about a year ago, I strongly recommend to come back sooner than once a year. Next visit in 3 months. Labs

## 2011-10-19 NOTE — Assessment & Plan Note (Signed)
see physical exam

## 2011-10-19 NOTE — Progress Notes (Signed)
  Subjective:    Patient ID: Christian Petty, male    DOB: May 18, 1956, 56 y.o.   MRN: [id number]  HPI Here for a complete physical exam. Needs his DOT papers filled. Diabetes, reports good compliance with medication, ambulatory CBGs  ranged from 120-140, occasionally 90. High cholesterol, reports good compliance with medication despite the fact that we have not been prescribing enough meds to  last for one year. He is not exercising at all. His diet fluctuates  Past Medical History  Diagnosis Date  . Neuropathy 09/2009  . HTN (hypertension) 2007    BP normal as of 2013 w/o meds   . Diabetes mellitus 2008  . Chest pain     (-) cardiolite  07-2006  . Hyperlipidemia, mild    Past Surgical History  Procedure Date  . Lumbar laminectomy     x2  . Inguinal hernia repair    History   Social History  . Marital Status: Married    Spouse Name: N/A    Number of Children: 2  . Years of Education: N/A   Occupational History  . truck driver     Social History Main Topics  . Smoking status: Current Everyday Smoker -- 1.0 packs/day  . Smokeless tobacco: Never Used  . Alcohol Use: Yes     socially   . Drug Use: No  . Sexually Active: Not on file   Other Topics Concern  . Not on file   Social History Narrative      Family History  Problem Relation Age of Onset  . Diabetes Father   . Breast cancer Mother   . Hypertension Neg Hx   . Prostate cancer Neg Hx   . Colon cancer Neg Hx   . Coronary artery disease Neg Hx   . Stroke  44    F    Review of Systems No chest pain or shortness of breath No nausea, vomiting, diarrhea. No blood in the urine or difficulty urinating. No anxiety- depression. He is however concerned because his wife is going to looseher job and Programmer, applications.    Objective:   Physical Exam General -- alert, well-developed, and well-nourished.   Neck --no thyromegaly , normal carotid pulse  HEENT -- normal hearing Lungs -- normal respiratory effort, no  intercostal retractions, no accessory muscle use, and normal breath sounds.   Heart-- normal rate, regular rhythm, no murmur, and no gallop.   Abdomen--soft, non-tender, no distention, no masses, no HSM, no guarding, and no rigidity.   Extremities-- no pretibial edema bilaterally Rectal-- No external abnormalities noted. Normal sphincter tone. No rectal masses or tenderness. Brown stool, Prostate:  Prostate gland firm and smooth, no enlargement, nodularity, tenderness, mass, asymmetry or induration. Neurologic-- alert & oriented X3 and strength normal in all extremities. LE DTRs normal Psych-- Cognition and judgment appear intact. Alert and cooperative with normal attention span and concentration.  not anxious appearing and not depressed appearing.        Assessment & Plan:  We spent an additional  20 minutes in addition to his physical adressing his diabetes, high cholesterol and the DOT physical.

## 2011-10-19 NOTE — Assessment & Plan Note (Addendum)
Td 2004 Cscope 2007: 4 hyperplastic polyps, next 2017  DRE today is normal diet-exercise--> discussed  Counseling about risks of tobacco, recommended to see his dentist yearly. 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

## 2011-10-19 NOTE — Patient Instructions (Addendum)
Come back fasting : CMP, TSH, CBC, PSA--- dx V70 FLP---- dx  hyperlipidemia Hemoglobin A1c, microalbumin ---- dx diabetes

## 2011-10-20 ENCOUNTER — Other Ambulatory Visit (INDEPENDENT_AMBULATORY_CARE_PROVIDER_SITE_OTHER): Payer: BC Managed Care – PPO

## 2011-10-20 DIAGNOSIS — Z0000 Encounter for general adult medical examination without abnormal findings: Secondary | ICD-10-CM

## 2011-10-20 LAB — CBC WITH DIFFERENTIAL/PLATELET
Basophils Relative: 0.4 % (ref 0.0–3.0)
Eosinophils Relative: 2.4 % (ref 0.0–5.0)
Lymphocytes Relative: 17.2 % (ref 12.0–46.0)
MCV: 87.7 fl (ref 78.0–100.0)
Monocytes Absolute: 0.5 10*3/uL (ref 0.1–1.0)
Monocytes Relative: 5.8 % (ref 3.0–12.0)
Neutrophils Relative %: 74.2 % (ref 43.0–77.0)
Platelets: 232 10*3/uL (ref 150.0–400.0)
RBC: 5.44 Mil/uL (ref 4.22–5.81)
WBC: 9.2 10*3/uL (ref 4.5–10.5)

## 2011-10-20 LAB — COMPREHENSIVE METABOLIC PANEL
AST: 37 U/L (ref 0–37)
Alkaline Phosphatase: 40 U/L (ref 39–117)
Glucose, Bld: 180 mg/dL — ABNORMAL HIGH (ref 70–99)
Sodium: 138 mEq/L (ref 135–145)
Total Bilirubin: 0.6 mg/dL (ref 0.3–1.2)
Total Protein: 7.3 g/dL (ref 6.0–8.3)

## 2011-10-20 LAB — LIPID PANEL
HDL: 25 mg/dL — ABNORMAL LOW (ref >39.00)
Total CHOL/HDL Ratio: 6
VLDL: 79.2 mg/dL — ABNORMAL HIGH (ref 0.0–40.0)

## 2011-10-20 LAB — PSA: PSA: 0.46 ng/mL (ref 0.10–4.00)

## 2011-10-20 LAB — LDL CHOLESTEROL, DIRECT: Direct LDL: 64.3 mg/dL

## 2011-10-20 LAB — TSH: TSH: 1.1 u[IU]/mL (ref 0.35–5.50)

## 2011-10-20 LAB — HEMOGLOBIN A1C: Hgb A1c MFr Bld: 7.2 % — ABNORMAL HIGH (ref 4.6–6.5)

## 2011-10-20 MED ORDER — GLIMEPIRIDE 2 MG PO TABS: 2.0000 mg | ORAL_TABLET | Freq: Every day | ORAL | Status: DC

## 2011-10-20 MED ORDER — PRAVASTATIN SODIUM 20 MG PO TABS: 20.0000 mg | ORAL_TABLET | Freq: Every day | ORAL | Status: DC

## 2011-10-20 NOTE — Progress Notes (Signed)
Addended by: Silvio Pate D on: 10/20/2011 10:55 AM   Modules accepted: Orders

## 2011-10-20 NOTE — Progress Notes (Signed)
Addended by: Edwena Felty T on: 10/20/2011 10:47 AM   Modules accepted: Orders

## 2011-10-20 NOTE — Progress Notes (Signed)
Addended by: Silvio Pate D on: 10/20/2011 09:26 AM   Modules accepted: Orders

## 2011-10-21 LAB — MICROALBUMIN, URINE: Microalb, Ur: 1.16 mg/dL (ref 0.00–1.89)

## 2011-10-22 LAB — URINE CULTURE
Colony Count: NO GROWTH
Organism ID, Bacteria: NO GROWTH

## 2011-10-27 ENCOUNTER — Telehealth: Payer: Self-pay | Admitting: Internal Medicine

## 2011-10-27 MED ORDER — FENOFIBRATE 145 MG PO TABS: 145.0000 mg | ORAL_TABLET | Freq: Every day | ORAL | Status: DC

## 2011-10-27 MED ORDER — GLIMEPIRIDE 2 MG PO TABS: 3.0000 mg | ORAL_TABLET | Freq: Every day | ORAL | Status: DC

## 2011-10-27 NOTE — Telephone Encounter (Signed)
Advise patient: Diabetes needs better control. Triglycerides elevated. Other labs normal Plan: Increase glyburide from 2-3 mg, prescription sent Add fenofibrate, prescription sent Continue other meds Watch for side effects like muscle aches , watch for low sugar symptoms and followup in 3 months

## 2011-10-27 NOTE — Telephone Encounter (Signed)
Discussed with pt

## 2011-12-03 ENCOUNTER — Telehealth: Payer: Self-pay | Admitting: Internal Medicine

## 2011-12-03 NOTE — Telephone Encounter (Signed)
Mailed labs.

## 2011-12-03 NOTE — Telephone Encounter (Signed)
Pt states he needs [place] to mail him the results of his last A1c test to his house. He needs them sent to him so he can send them to the [facility]. The address listed in his account is the address to which he would like the results mailed.

## 2011-12-14 ENCOUNTER — Telehealth: Payer: Self-pay | Admitting: Internal Medicine

## 2011-12-14 NOTE — Telephone Encounter (Signed)
Please call patient back he has some questions regarding his sugar Ph# [phone]

## 2011-12-14 NOTE — Telephone Encounter (Signed)
He is type 2

## 2011-12-14 NOTE — Telephone Encounter (Signed)
Spoke with pt, he is concerned that his diabetes was type 1 and now type 2. 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 Please advise.

## 2011-12-15 NOTE — Telephone Encounter (Signed)
I dont believe so

## 2011-12-15 NOTE — Telephone Encounter (Signed)
Discussed with pt

## 2011-12-15 NOTE — Telephone Encounter (Signed)
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

## 2011-12-16 ENCOUNTER — Telehealth: Payer: Self-pay | Admitting: Internal Medicine

## 2011-12-16 MED ORDER — FENOFIBRATE 145 MG PO TABS: 145.0000 mg | ORAL_TABLET | Freq: Every day | ORAL | Status: DC

## 2011-12-16 NOTE — Telephone Encounter (Signed)
patient has no insurance and can not afford Fenofibrate it is $135.00 for 30-day supply. We have given him samples to get him thru this month can you prescribe anything that may be cheaper for him to purchase thru the drug store? Please advise  Patient call back [phone]

## 2011-12-16 NOTE — Telephone Encounter (Signed)
Patient states he needs [place] to send in his rx for fenofibrate. He states the pharmacy does not have it. [facility] [place].

## 2011-12-16 NOTE — Telephone Encounter (Signed)
Resent rx

## 2012-01-06 ENCOUNTER — Other Ambulatory Visit: Payer: Self-pay | Admitting: Internal Medicine

## 2012-01-06 NOTE — Telephone Encounter (Signed)
Patient states he does not have insurance and with thee discount card Tricor will cost him $135.00. Pharmacist advised patient he could take OTC Niacin in place of this medication. Please review and advise, patient will be leaving at noon today ph# [phone]

## 2012-01-06 NOTE — Telephone Encounter (Signed)
Please advise 

## 2012-01-07 MED ORDER — FENOFIBRATE 160 MG PO TABS: 160.0000 mg | ORAL_TABLET | Freq: Every day | ORAL | Status: DC

## 2012-01-07 MED ORDER — FENOFIBRATE 150 MG PO CAPS: 1.0000 | ORAL_CAPSULE | Freq: Every day | ORAL | Status: DC

## 2012-01-07 NOTE — Telephone Encounter (Signed)
Re-sent rx for fenofibrate 160mg .

## 2012-01-07 NOTE — Telephone Encounter (Signed)
They are not the same, try generic fenofibrate 150 mg daily. Other options are samples or a discount card if available

## 2012-01-07 NOTE — Addendum Note (Signed)
Addended by: Edwena Felty T on: 01/07/2012 03:56 PM   Modules accepted: Orders

## 2012-01-07 NOTE — Telephone Encounter (Signed)
Left detailed msg on pt's vmail. Sent in rx.

## 2012-04-02 ENCOUNTER — Other Ambulatory Visit: Payer: Self-pay | Admitting: Internal Medicine

## 2012-04-03 NOTE — Telephone Encounter (Signed)
Refill done.  

## 2012-04-28 ENCOUNTER — Other Ambulatory Visit: Payer: Self-pay

## 2012-04-28 NOTE — Telephone Encounter (Signed)
Pt states Rx is only for #90 tab but he always runs out because he takes 1 1/2 daily need more tabs. Need okay.    Plz advise     MW

## 2012-05-05 MED ORDER — GLIMEPIRIDE 2 MG PO TABS: 3.0000 mg | ORAL_TABLET | Freq: Every day | ORAL | Status: DC

## 2012-05-05 NOTE — Telephone Encounter (Signed)
Refill done.  

## 2012-09-25 ENCOUNTER — Other Ambulatory Visit: Payer: Self-pay | Admitting: Internal Medicine

## 2012-09-25 NOTE — Telephone Encounter (Signed)
Spoke w/pt. He will call back to schedule appt as he is unsure of his schedule.

## 2012-09-25 NOTE — Telephone Encounter (Signed)
Advise pt, I sent 1 month supply of meds to his pharmacy, unable to keep refilling w/o OV, please arrange

## 2012-09-25 NOTE — Telephone Encounter (Signed)
i just send a 30 day supply on meds, needs OV before next RF, please arrange

## 2012-10-13 ENCOUNTER — Ambulatory Visit (INDEPENDENT_AMBULATORY_CARE_PROVIDER_SITE_OTHER): Payer: Self-pay | Admitting: Internal Medicine

## 2012-10-13 VITALS — BP 142/84 | HR 81 | Temp 97.9°F | Wt 225.0 lb

## 2012-10-13 DIAGNOSIS — I10 Essential (primary) hypertension: Secondary | ICD-10-CM

## 2012-10-13 DIAGNOSIS — E119 Type 2 diabetes mellitus without complications: Secondary | ICD-10-CM

## 2012-10-13 DIAGNOSIS — E785 Hyperlipidemia, unspecified: Secondary | ICD-10-CM

## 2012-10-13 LAB — BASIC METABOLIC PANEL
BUN: 18 mg/dL (ref 6–23)
Creatinine, Ser: 1.1 mg/dL (ref 0.4–1.5)
GFR: 77.16 mL/min (ref >60.00)

## 2012-10-13 LAB — HEMOGLOBIN A1C: Hgb A1c MFr Bld: 7 % — ABNORMAL HIGH (ref 4.6–6.5)

## 2012-10-13 LAB — HM DIABETES EYE EXAM

## 2012-10-13 LAB — LIPID PANEL: VLDL: 87.6 mg/dL — ABNORMAL HIGH (ref 0.0–40.0)

## 2012-10-13 LAB — ALT: ALT: 60 U/L — ABNORMAL HIGH (ref 0–53)

## 2012-10-13 MED ORDER — LOSARTAN POTASSIUM 50 MG PO TABS: 50.0000 mg | ORAL_TABLET | Freq: Every day | ORAL | Status: DC

## 2012-10-13 NOTE — Progress Notes (Signed)
  Subjective:    Patient ID: Christian Petty, male    DOB: 1955/12/08, 57 y.o.   MRN: [id number]  HPI Routine office visit diabetes, good compliance with medications. Check his blood sugars around once a week. Last blood sugar 111. Recently at an eye exam, was told he has early retinopathy. High cholesterol, taking  both Pravachol and fenofibrate.  Past Medical History  Diagnosis Date  . Neuropathy 09/2009  . HTN (hypertension) 2007    BP normal as of 2013 w/o meds   . Diabetes mellitus 2008  . Chest pain     (-) cardiolite  07-2006  . Hyperlipidemia, mild    Past Surgical History  Procedure Laterality Date  . Lumbar laminectomy      x2  . Inguinal hernia repair     Review of Systems Denies chest pain or shortness or breath. No lower extremity edema. No nausea, vomiting, diarrhea. No blurred vision. Occasionally has tingling in the toes, worse at night, no pain.     Objective:   Physical Exam  General -- alert, well-developed, NAD .   Lungs -- normal respiratory effort, no intercostal retractions, no accessory muscle use, and normal breath sounds. Heart-- normal rate, regular rhythm, no murmur, and no gallop.   DIABETIC FEET EXAM: No lower extremity edema Normal pedal pulses bilaterally Skin and nails are normal without calluses Pinprick examination of the feet normal. Neurologic-- alert & oriented X3 and strength normal in all extremities. Psych-- Cognition and judgment appear intact. Alert and cooperative with normal attention span and concentration.  not anxious appearing and not depressed appearing.      Assessment & Plan:

## 2012-10-13 NOTE — Patient Instructions (Addendum)
Start taking losartan 1 tablet every day. Check the  blood pressure 2 or 3 times a week, be sure it is between 110/60 and 140/85. If it is consistently higher or lower, let me know --- Please arrange labs for 3 weeks from today (BMP--- dx  Hypertension) --- Next visit with me 4 months, please   make an appointment

## 2012-10-13 NOTE — Assessment & Plan Note (Signed)
Good compliance with Pravachol and fenofibrate. Labs

## 2012-10-13 NOTE — Assessment & Plan Note (Addendum)
BP slightly elevated today, he is diabetic, will start a low dose of losartan. BMP in one month. See instructions.

## 2012-10-13 NOTE — Assessment & Plan Note (Addendum)
Not well-controlled per last A1c. He does not visit  to the office frequently due to no insurance however I advised him to come back in 4 months. Feet exam normal today but has early sx of neuropathy (tingling) Recently diagnosed with retinopathy per patient, this only increased the need to have his A1c less than 7.

## 2012-10-14 ENCOUNTER — Encounter: Payer: Self-pay | Admitting: Internal Medicine

## 2012-10-18 ENCOUNTER — Telehealth: Payer: Self-pay | Admitting: *Deleted

## 2012-10-18 NOTE — Telephone Encounter (Signed)
Pt called stating that since he started taking the losartan his CBG's have increased. Yesterday his readings were 183, 215, 220, & 180. This morning before eating his CBG was 152. Please advise.

## 2012-10-18 NOTE — Telephone Encounter (Signed)
i doubt losartan is increasing his blood sugars, recommend no change for now. Continue current medications including the higher dose of glimepiride  (see last labs), watch diet and stay active

## 2012-10-20 NOTE — Telephone Encounter (Signed)
Discussed with pt

## 2012-10-26 ENCOUNTER — Encounter: Payer: Self-pay | Admitting: *Deleted

## 2012-10-26 ENCOUNTER — Telehealth: Payer: Self-pay | Admitting: Internal Medicine

## 2012-10-26 NOTE — Telephone Encounter (Signed)
Mailed pts lab results

## 2012-10-26 NOTE — Telephone Encounter (Signed)
Pt called and stated that his recent A1c result was suppose to be sent to his house and has not received a copy yet.

## 2012-10-29 ENCOUNTER — Other Ambulatory Visit: Payer: Self-pay | Admitting: Internal Medicine

## 2012-10-30 NOTE — Telephone Encounter (Signed)
Refill done.  

## 2012-11-04 ENCOUNTER — Other Ambulatory Visit: Payer: Self-pay | Admitting: Internal Medicine

## 2012-11-06 ENCOUNTER — Other Ambulatory Visit: Payer: Self-pay

## 2012-11-06 ENCOUNTER — Other Ambulatory Visit (INDEPENDENT_AMBULATORY_CARE_PROVIDER_SITE_OTHER): Payer: Self-pay

## 2012-11-06 DIAGNOSIS — I10 Essential (primary) hypertension: Secondary | ICD-10-CM

## 2012-11-06 LAB — BASIC METABOLIC PANEL
CO2: 25 mEq/L (ref 19–32)
Calcium: 9.1 mg/dL (ref 8.4–10.5)
Potassium: 3.9 mEq/L (ref 3.5–5.1)
Sodium: 138 mEq/L (ref 135–145)

## 2012-11-06 NOTE — Telephone Encounter (Signed)
Refill done.  

## 2012-11-08 ENCOUNTER — Encounter: Payer: Self-pay | Admitting: *Deleted

## 2012-12-01 ENCOUNTER — Other Ambulatory Visit: Payer: Self-pay | Admitting: Internal Medicine

## 2012-12-01 NOTE — Telephone Encounter (Signed)
Spoke with pharmacy. Pt still has refills left on file. Refill request sent in error.

## 2012-12-02 ENCOUNTER — Other Ambulatory Visit: Payer: Self-pay | Admitting: Internal Medicine

## 2012-12-04 NOTE — Telephone Encounter (Signed)
Refill done.  

## 2012-12-08 ENCOUNTER — Other Ambulatory Visit: Payer: Self-pay | Admitting: Internal Medicine

## 2012-12-08 NOTE — Telephone Encounter (Signed)
Refill done.  

## 2013-02-02 ENCOUNTER — Other Ambulatory Visit: Payer: Self-pay | Admitting: Internal Medicine

## 2013-03-01 ENCOUNTER — Encounter: Payer: Self-pay | Admitting: Internal Medicine

## 2013-03-01 ENCOUNTER — Telehealth: Payer: Self-pay | Admitting: Internal Medicine

## 2013-03-01 NOTE — Telephone Encounter (Incomplete)
Spoke with patient, stated he is taking 2 tablets daily as instructed by Dr. Drue Novel at last OV 3.28.14. Confirmed with records/result notes that pt. Was instructed to increase to 2 tablets daily on 10/13/12. Contacted pharmacy to cancel prior refill sent this morning with incorrect sig. Clarified with Dr. Drue Novel ok to change sig. Contacted pharmacy, spoke with pharmacist and verbally gave new instructions 2 tablets daily # 60 and 5 refills per protocol. Pt. Encouraged to contact office to schedule OV. Pt. Verbalized understanding.

## 2013-03-01 NOTE — Telephone Encounter (Signed)
Spoke with pharmacy. Refill sent in on 10/29/12 was never received by pharmacy. No refills left on file. Rx re-sent per protocol.

## 2013-03-01 NOTE — Telephone Encounter (Signed)
Sig clarified with pharmacy

## 2013-03-02 ENCOUNTER — Telehealth: Payer: Self-pay | Admitting: *Deleted

## 2013-03-02 NOTE — Telephone Encounter (Signed)
Pt. Wanted clarification on whether is was appropriate for him to be on both fenofibrate and pravastatin for his cholesterol due to the fact he does not currently have insurance. Pt. States if he needs to take both this is fine, just wanted clarification.

## 2013-03-02 NOTE — Telephone Encounter (Signed)
Yes, is important to take both

## 2013-03-02 NOTE — Telephone Encounter (Signed)
Discussed with patient, verbalized understanding.  

## 2013-04-17 ENCOUNTER — Other Ambulatory Visit: Payer: Self-pay | Admitting: Internal Medicine

## 2013-04-17 NOTE — Telephone Encounter (Signed)
rx refilled per protocol. DJR  

## 2013-04-26 ENCOUNTER — Other Ambulatory Visit: Payer: Self-pay | Admitting: Internal Medicine

## 2013-04-26 NOTE — Telephone Encounter (Signed)
rx refilled per protocol. DJR  

## 2013-05-06 ENCOUNTER — Other Ambulatory Visit: Payer: Self-pay | Admitting: Internal Medicine

## 2013-05-07 ENCOUNTER — Other Ambulatory Visit: Payer: Self-pay | Admitting: *Deleted

## 2013-05-07 MED ORDER — LOSARTAN POTASSIUM 50 MG PO TABS: ORAL_TABLET | ORAL | Status: DC

## 2013-05-07 NOTE — Telephone Encounter (Signed)
Losartan refill sent to pharmacy. OV due

## 2013-06-08 ENCOUNTER — Other Ambulatory Visit: Payer: Self-pay | Admitting: Internal Medicine

## 2013-06-08 NOTE — Telephone Encounter (Signed)
Metformin refilled per protocol 

## 2013-07-02 ENCOUNTER — Other Ambulatory Visit: Payer: Self-pay | Admitting: Internal Medicine

## 2013-07-02 NOTE — Telephone Encounter (Signed)
rx refilled per protocol. DJR  

## 2013-07-09 ENCOUNTER — Other Ambulatory Visit: Payer: Self-pay | Admitting: Internal Medicine

## 2013-07-10 NOTE — Telephone Encounter (Signed)
rx refilled per protocol. DJR  

## 2013-07-23 ENCOUNTER — Other Ambulatory Visit: Payer: Self-pay | Admitting: Internal Medicine

## 2013-08-17 ENCOUNTER — Other Ambulatory Visit: Payer: Self-pay | Admitting: Internal Medicine

## 2013-09-07 ENCOUNTER — Encounter: Payer: Self-pay | Admitting: Internal Medicine

## 2013-09-07 ENCOUNTER — Ambulatory Visit (INDEPENDENT_AMBULATORY_CARE_PROVIDER_SITE_OTHER): Payer: BC Managed Care – PPO | Admitting: Internal Medicine

## 2013-09-07 VITALS — BP 142/79 | HR 79 | Temp 98.0°F | Ht 71.0 in | Wt 228.0 lb

## 2013-09-07 DIAGNOSIS — I10 Essential (primary) hypertension: Secondary | ICD-10-CM

## 2013-09-07 DIAGNOSIS — Z23 Encounter for immunization: Secondary | ICD-10-CM

## 2013-09-07 DIAGNOSIS — E785 Hyperlipidemia, unspecified: Secondary | ICD-10-CM

## 2013-09-07 DIAGNOSIS — F17200 Nicotine dependence, unspecified, uncomplicated: Secondary | ICD-10-CM

## 2013-09-07 DIAGNOSIS — E119 Type 2 diabetes mellitus without complications: Secondary | ICD-10-CM

## 2013-09-07 LAB — COMPREHENSIVE METABOLIC PANEL
ALK PHOS: 30 U/L — AB (ref 39–117)
ALT: 64 U/L — ABNORMAL HIGH (ref 0–53)
AST: 28 U/L (ref 0–37)
Albumin: 4.4 g/dL (ref 3.5–5.2)
BILIRUBIN TOTAL: 0.5 mg/dL (ref 0.3–1.2)
BUN: 19 mg/dL (ref 6–23)
CO2: 28 mEq/L (ref 19–32)
Calcium: 10.2 mg/dL (ref 8.4–10.5)
Chloride: 104 mEq/L (ref 96–112)
Creatinine, Ser: 1.2 mg/dL (ref 0.4–1.5)
GFR: 64.08 mL/min (ref >60.00)
Glucose, Bld: 132 mg/dL — ABNORMAL HIGH (ref 70–99)
POTASSIUM: 4.1 meq/L (ref 3.5–5.1)
SODIUM: 138 meq/L (ref 135–145)
TOTAL PROTEIN: 7.4 g/dL (ref 6.0–8.3)

## 2013-09-07 LAB — CBC WITH DIFFERENTIAL/PLATELET
BASOS ABS: 0 10*3/uL (ref 0.0–0.1)
Basophils Relative: 0.5 % (ref 0.0–3.0)
Eosinophils Absolute: 0.2 10*3/uL (ref 0.0–0.7)
Eosinophils Relative: 3 % (ref 0.0–5.0)
HEMATOCRIT: 47.8 % (ref 39.0–52.0)
Hemoglobin: 16.1 g/dL (ref 13.0–17.0)
LYMPHS ABS: 1.8 10*3/uL (ref 0.7–4.0)
Lymphocytes Relative: 23.7 % (ref 12.0–46.0)
MCHC: 33.7 g/dL (ref 30.0–36.0)
MCV: 88.6 fl (ref 78.0–100.0)
MONO ABS: 0.5 10*3/uL (ref 0.1–1.0)
Monocytes Relative: 6.9 % (ref 3.0–12.0)
Neutro Abs: 5 10*3/uL (ref 1.4–7.7)
Neutrophils Relative %: 65.9 % (ref 43.0–77.0)
PLATELETS: 271 10*3/uL (ref 150.0–400.0)
RBC: 5.4 Mil/uL (ref 4.22–5.81)
RDW: 13.4 % (ref 11.5–14.6)
WBC: 7.6 10*3/uL (ref 4.5–10.5)

## 2013-09-07 LAB — LIPID PANEL
CHOLESTEROL: 159 mg/dL (ref 0–200)
HDL: 23.3 mg/dL — ABNORMAL LOW (ref >39.00)
Total CHOL/HDL Ratio: 7
Triglycerides: 554 mg/dL — ABNORMAL HIGH (ref 0.0–149.0)
VLDL: 110.8 mg/dL — ABNORMAL HIGH (ref 0.0–40.0)

## 2013-09-07 LAB — HM DIABETES EYE EXAM

## 2013-09-07 LAB — HEMOGLOBIN A1C: Hgb A1c MFr Bld: 7.7 % — ABNORMAL HIGH (ref 4.6–6.5)

## 2013-09-07 LAB — LDL CHOLESTEROL, DIRECT: Direct LDL: 67.5 mg/dL

## 2013-09-07 MED ORDER — LOSARTAN POTASSIUM 50 MG PO TABS: ORAL_TABLET | ORAL | Status: DC

## 2013-09-07 MED ORDER — GLIMEPIRIDE 2 MG PO TABS: 2.0000 mg | ORAL_TABLET | Freq: Every day | ORAL | Status: DC

## 2013-09-07 MED ORDER — METFORMIN HCL 1000 MG PO TABS: ORAL_TABLET | ORAL | Status: DC

## 2013-09-07 MED ORDER — FENOFIBRATE 160 MG PO TABS: ORAL_TABLET | ORAL | Status: DC

## 2013-09-07 MED ORDER — PRAVASTATIN SODIUM 20 MG PO TABS: ORAL_TABLET | ORAL | Status: DC

## 2013-09-07 NOTE — Progress Notes (Signed)
Subjective:    Patient ID: Christian Petty, male    DOB: [birth year], 58 y.o.   MRN: [id number]  DOS:  09/07/2013 ROV Diabetes--good medication compliance, he always has occasional low blood sugar but in the last 2 months it has been more noticeable, see assessment and plan. High cholesterol--good medication compliance, no apparent side effects Hypertension- good medication compliance, ambulatory BPs are checked from time to time and range from 120, 140. Tobacco--started to smoke at age 74, in the past he quit twice, never tried any medication. He states he is ready to quit again. He is concerned about weight gain.   ROS Diet-- hard to eat healthy d/t job Exercise-- occ walks   No  CP, SOB  Denies  nausea, vomiting diarrhea  (-) cough, sputum production,  (-)hemoptysis No dysuria, gross hematuria, difficulty urinating  (-) LE paresthesias   Past Medical History  Diagnosis Date  . Neuropathy 09/2009  . HTN (hypertension) 2007    BP normal as of 2013 w/o meds   . Diabetes mellitus 2008  . Chest pain     (-) cardiolite  07-2006  . Hyperlipidemia, mild     Past Surgical History  Procedure Laterality Date  . Lumbar laminectomy      x2  . Inguinal hernia repair      History   Social History  . Marital Status: Married    Spouse Name: N/A    Number of Children: 2  . Years of Education: N/A   Occupational History  . truck driver     Social History Main Topics  . Smoking status: Current Every Day Smoker -- 1.00 packs/day  . Smokeless tobacco: Never Used  . Alcohol Use: Yes     Comment: socially   . Drug Use: No  . Sexual Activity: Not on file   Other Topics Concern  . 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   Exercise- not much (walks 30 min x 2/week)         Medication List       This list is accurate as of: 09/07/13 11:59 PM.  Always use your most recent med list.               fenofibrate 160  MG tablet  TAKE 1 TABLET (160 MG TOTAL) BY MOUTH DAILY.     glimepiride 2 MG tablet  Commonly known as:  AMARYL  Take 1 tablet (2 mg total) by mouth daily before breakfast. Takes 2 tablets daily per Dr. Larose Kells 10/13/12     losartan 50 MG tablet  Commonly known as:  COZAAR  TAKE 1 TABLET (50 MG TOTAL) BY MOUTH DAILY.     metFORMIN 1000 MG tablet  Commonly known as:  GLUCOPHAGE  TAKE 1 TABLET BY MOUTH TWO TIMES DAILY WITH A MEAL.     pravastatin 20 MG tablet  Commonly known as:  PRAVACHOL  Take one tablet by mouth daily.           Objective:   Physical Exam BP 142/79  Pulse 79  Temp(Src) 98 F (36.7 C)  Ht 5\' 11"  (1.803 m)  Wt 228 lb (103.42 kg)  BMI 31.81 kg/m2  SpO2 99% General -- alert, well-developed, NAD.  Lungs -- normal respiratory effort, no intercostal retractions, no accessory muscle use, and normal breath sounds.  Heart-- normal rate, regular rhythm, no murmur.  Extremities-- no pretibial edema  bilaterally  Neurologic--  alert & oriented X3. Speech normal, gait normal, strength normal in all extremities.  Psych-- Cognition and judgment appear intact. Cooperative with normal attention span and concentration. No anxious or depressed appearing.     Assessment & Plan:

## 2013-09-07 NOTE — Assessment & Plan Note (Signed)
Good med compliance , labs  

## 2013-09-07 NOTE — Assessment & Plan Note (Addendum)
Good compliance with medications, use to have occasional hypoglycemia, last 3 months they have been more noticeable despite no change in his medication or lifestyle, the lowest has been 43, he is always symptomatic. He does carry orange juice with him. Plan: Labs D/c amaryl and start  Januvia ?   to making less prone to hypoglycemia Has an appointment to see the eye doctor today

## 2013-09-07 NOTE — Assessment & Plan Note (Addendum)
Seems well controlled, ambulatory BPs never more than 140. Labs. Likes his labs results  sent to his house.

## 2013-09-07 NOTE — Assessment & Plan Note (Addendum)
Patient is ready to quit, extensively counseled. He is concerned about weight gain but gaining a few pounds and stopping tobacco  is in general better for him. Discussed Chantix, Wellbutrin, nicotine supplementation. Information about quitting provided, will call when ready for medicines

## 2013-09-07 NOTE — Progress Notes (Signed)
Pre visit review using our clinic review tool, if applicable. No additional management support is needed unless otherwise documented below in the visit note. 

## 2013-09-07 NOTE — Patient Instructions (Signed)
Get your blood work before you leave   Next visit is for a physical exam in 3-4 months No need to come back fasting Please make an appointment    Call if you continue having episodes of low blood sugar  If you need more information about tobacco cessation visit  the American Heart Association, it  is a great resource online at:  http://www.richard-flynn.net/     Hypoglycemia (Low Blood Sugar) Hypoglycemia is when the glucose (sugar) in your blood is too low. Hypoglycemia can happen for many reasons. It can happen to people with or without diabetes. Hypoglycemia can develop quickly and can be a medical emergency.  CAUSES  Having hypoglycemia does not mean that you will develop diabetes. Different causes include:  Missed or delayed meals or not enough carbohydrates eaten.  Medication overdose. This could be by accident or deliberate. If by accident, your medication may need to be adjusted or changed.  Exercise or increased activity without adjustments in carbohydrates or medications.  A nerve disorder that affects body functions like your heart rate, blood pressure and digestion (autonomic neuropathy).  A condition where the stomach muscles do not function properly (gastroparesis). Therefore, medications may not absorb properly.  The inability to recognize the signs of hypoglycemia (hypoglycemic unawareness).  Absorption of insulin  may be altered.  Alcohol consumption.  Pregnancy/menstrual cycles/postpartum. This may be due to hormones.  Certain kinds of tumors. This is very rare. SYMPTOMS   Sweating.  Hunger.  Dizziness.  Blurred vision.  Drowsiness.  Weakness.  Headache.  Rapid heart beat.  Shakiness.  Nervousness. DIAGNOSIS  Diagnosis is made by monitoring blood glucose in one or all of the following ways:  Fingerstick blood glucose monitoring.  Laboratory results. TREATMENT  If you think your blood glucose is low:  Check your blood glucose, if  possible. If it is less than 70 mg/dl, take one of the following:  3-4 glucose tablets.   cup juice (prefer clear like apple).   cup "regular" soda pop.  1 cup milk.  -1 tube of glucose gel.  5-6 hard candies.  Do not over treat because your blood glucose (sugar) will only go too high.  Wait 15 minutes and recheck your blood glucose. If it is still less than 70 mg/dl (or below your target range), repeat treatment.  Eat a snack if it is more than one hour until your next meal. Sometimes, your blood glucose may go so low that you are unable to treat yourself. You may need someone to help you. You may even pass out or be unable to swallow. This may require you to get an injection of glucagon, which raises the blood glucose. HOME CARE INSTRUCTIONS  Check blood glucose as recommended by your caregiver.  Take medication as prescribed by your caregiver.  Follow your meal plan. Do not skip meals. Eat on time.  If you are going to drink alcohol, drink it only with meals.  Check your blood glucose before driving.  Check your blood glucose before and after exercise. If you exercise longer or different than usual, be sure to check blood glucose more frequently.  Always carry treatment with you. Glucose tablets are the easiest to carry.  Always wear medical alert jewelry or carry some form of identification that states that you have diabetes. This will alert people that you have diabetes. If you have hypoglycemia, they will have a better idea on what to do. SEEK MEDICAL CARE IF:   You are having problems  keeping your blood sugar at target range.  You are having frequent episodes of hypoglycemia.  You feel you might be having side effects from your medicines.  You have symptoms of an illness that is not improving after 3-4 days.  You notice a change in vision or a new problem with your vision. SEEK IMMEDIATE MEDICAL CARE IF:   You are a family member or friend of a person whose  blood glucose goes below 70 mg/dl and is accompanied by:  Confusion.  A change in mental status.  The inability to swallow.  Passing out. Document Released: 07/05/2005 Document Revised: 09/27/2011 Document Reviewed: 11/01/2011 [facility] Patient Information 2014 [place], [place].      Smoking Cessation Quitting smoking is important to your health and has many advantages. However, it is not always easy to quit since nicotine is a very addictive drug. Often times, people try 3 times or more before being able to quit. This document explains the best ways for you to prepare to quit smoking. Quitting takes hard work and a lot of effort, but you can do it. ADVANTAGES OF QUITTING SMOKING  You will live longer, feel better, and live better.  Your body will feel the impact of quitting smoking almost immediately.  Within 20 minutes, blood pressure decreases. Your pulse returns to its normal level.  After 8 hours, carbon monoxide levels in the blood return to normal. Your oxygen level increases.  After 24 hours, the chance of having a heart attack starts to decrease. Your breath, hair, and body stop smelling like smoke.  After 48 hours, damaged nerve endings begin to recover. Your sense of taste and smell improve.  After 72 hours, the body is virtually free of nicotine. Your bronchial tubes relax and breathing becomes easier.  After 2 to 12 weeks, lungs can hold more air. Exercise becomes easier and circulation improves.  The risk of having a heart attack, stroke, cancer, or lung disease is greatly reduced.  After 1 year, the risk of coronary heart disease is cut in half.  After 5 years, the risk of stroke falls to the same as a nonsmoker.  After 10 years, the risk of lung cancer is cut in half and the risk of other cancers decreases significantly.  After 15 years, the risk of coronary heart disease drops, usually to the level of a nonsmoker.  If you are pregnant, quitting smoking  will improve your chances of having a healthy baby.  The people you live with, especially any children, will be healthier.  You will have extra money to spend on things other than cigarettes. QUESTIONS TO THINK ABOUT BEFORE ATTEMPTING TO QUIT You may want to talk about your answers with your caregiver.  Why do you want to quit?  If you tried to quit in the past, what helped and what did not?  What will be the most difficult situations for you after you quit? How will you plan to handle them?  Who can help you through the tough times? Your family? Friends? A caregiver?  What pleasures do you get from smoking? What ways can you still get pleasure if you quit? Here are some questions to ask your caregiver:  How can you help me to be successful at quitting?  What medicine do you think would be best for me and how should I take it?  What should I do if I need more help?  What is smoking withdrawal like? How can I get information on withdrawal? GET  READY  Set a quit date.  Change your environment by getting rid of all cigarettes, ashtrays, matches, and lighters in your home, car, or work. Do not let people smoke in your home.  Review your past attempts to quit. Think about what worked and what did not. GET SUPPORT AND ENCOURAGEMENT You have a better chance of being successful if you have help. You can get support in many ways.  Tell your family, friends, and co-workers that you are going to quit and need their support. Ask them not to smoke around you.  Get individual, group, or telephone counseling and support. Programs are available at [employer] and health centers. Call your local health department for information about programs in your area.  Spiritual beliefs and practices may help some smokers quit.  Download a "quit meter" on your computer to keep track of quit statistics, such as how long you have gone without smoking, cigarettes not smoked, and money saved.  Get a  self-help book about quitting smoking and staying off of tobacco. [place] yourself from urges to smoke. Talk to someone, go for a walk, or occupy your time with a task.  Change your normal routine. Take a different route to work. Drink tea instead of coffee. Eat breakfast in a different place.  Reduce your stress. Take a hot bath, exercise, or read a book.  Plan something enjoyable to do every day. Reward yourself for not smoking.  Explore interactive web-based programs that specialize in helping you quit. GET MEDICINE AND USE IT CORRECTLY Medicines can help you stop smoking and decrease the urge to smoke. Combining medicine with the above behavioral methods and support can greatly increase your chances of successfully quitting smoking.  Nicotine replacement therapy helps deliver nicotine to your body without the negative effects and risks of smoking. Nicotine replacement therapy includes nicotine gum, lozenges, inhalers, nasal sprays, and skin patches. Some may be available over-the-counter and others require a prescription.  Antidepressant medicine helps people abstain from smoking, but how this works is unknown. This medicine is available by prescription.  Nicotinic receptor partial agonist medicine simulates the effect of nicotine in your brain. This medicine is available by prescription. Ask your caregiver for advice about which medicines to use and how to use them based on your health history. Your caregiver will tell you what side effects to look out for if you choose to be on a medicine or therapy. Carefully read the information on the package. Do not use any other product containing nicotine while using a nicotine replacement product.  RELAPSE OR DIFFICULT SITUATIONS Most relapses occur within the first 3 months after quitting. Do not be discouraged if you start smoking again. Remember, most people try several times before finally quitting. You may have  symptoms of withdrawal because your body is used to nicotine. You may crave cigarettes, be irritable, feel very hungry, cough often, get headaches, or have difficulty concentrating. The withdrawal symptoms are only temporary. They are strongest when you first quit, but they will go away within 10 14 days. To reduce the chances of relapse, try to:  Avoid drinking alcohol. Drinking lowers your chances of successfully quitting.  Reduce the amount of caffeine you consume. Once you quit smoking, the amount of caffeine in your body increases and can give you symptoms, such as a rapid heartbeat, sweating, and anxiety.  Avoid smokers because they can make you want to smoke.  Do not let weight gain distract  you. Many smokers will gain weight when they quit, usually less than 10 pounds. Eat a healthy diet and stay active. You can always lose the weight gained after you quit.  Find ways to improve your mood other than smoking. FOR MORE INFORMATION  www.smokefree.gov  Document Released: 06/29/2001 Document Revised: 01/04/2012 Document Reviewed: 10/14/2011 [facility] Patient Information 2014 [place].

## 2013-09-10 ENCOUNTER — Telehealth: Payer: Self-pay | Admitting: Internal Medicine

## 2013-09-10 NOTE — Telephone Encounter (Signed)
Relevant patient education mailed to patient.  

## 2013-09-12 ENCOUNTER — Other Ambulatory Visit: Payer: Self-pay | Admitting: Internal Medicine

## 2013-09-12 MED ORDER — SITAGLIPTIN PHOSPHATE 100 MG PO TABS: ORAL_TABLET | ORAL | Status: DC

## 2013-09-12 NOTE — Addendum Note (Signed)
Addended by: Peggyann Shoals on: 09/12/2013 04:26 PM   Modules accepted: Orders

## 2013-09-14 ENCOUNTER — Encounter: Payer: Self-pay | Admitting: *Deleted

## 2013-09-14 NOTE — Addendum Note (Signed)
Addended by: Peggyann Shoals on: 09/14/2013 04:51 PM   Modules accepted: Orders, Medications

## 2013-09-17 ENCOUNTER — Telehealth: Payer: Self-pay | Admitting: *Deleted

## 2013-09-17 NOTE — Telephone Encounter (Signed)
Patient left message on triage line that he is unable to afford the Januvia (cost after insurance is $191.00). Spoke with patient and asked him to contact his insurance company to get a list of more affordable medications equivalent to [place].

## 2013-09-19 ENCOUNTER — Telehealth: Payer: Self-pay | Admitting: Internal Medicine

## 2013-09-19 NOTE — Telephone Encounter (Signed)
Pt states spoke with insurance they had no alternatives to [place]. Also pt requesting cough medicine , has productive cough denies fever or hot/cold chills.

## 2013-09-19 NOTE — Telephone Encounter (Signed)
pts wife also states that [place] is too expensive requesting a alternative or more economical medication.

## 2013-09-19 NOTE — Telephone Encounter (Signed)
Patient wife called and stated that dr Larose Kells told her husband that he was going to write him a cough medication and he has not received it yet. Also patient would like copy of his recent lab results. Please advise

## 2013-09-19 NOTE — Telephone Encounter (Signed)
For now, call once a month and get a one-month supply of samples of Januvia.  Also, see about an Environmental consultant program from the [employer]. There is plenty of other alternatives meds (Invokana for instance)  but they are probably even more expensive.  Call Tessalon Perles 200 mg one by mouth twice a day #30, no refills take as needed for cough

## 2013-09-19 NOTE — Telephone Encounter (Signed)
1. provide samples of Januvia, one month supply 2. In the meantime the need to contact the insurance to see if there is a similar but less expensive medication specifically : ONGLYZA or TRADJENTA --- Let me know ASAP what is less expensive 3. I don't recall in about a cough medication, we did talk about meds to quit tobacco, wellbutrin or Chantix, does he need the rx ?

## 2013-09-20 ENCOUNTER — Encounter: Payer: Self-pay | Admitting: Internal Medicine

## 2013-09-20 MED ORDER — BENZONATATE 200 MG PO CAPS: 200.0000 mg | ORAL_CAPSULE | Freq: Two times a day (BID) | ORAL | Status: DC | PRN

## 2013-09-20 NOTE — Telephone Encounter (Signed)
pts wife notified rx pick up samples of januvia and tessalon perles sent to pharmacy.

## 2013-09-20 NOTE — Addendum Note (Signed)
Addended by: Peggyann Shoals on: 09/20/2013 10:09 AM   Modules accepted: Orders

## 2013-09-25 ENCOUNTER — Encounter: Payer: Self-pay | Admitting: Family Medicine

## 2013-09-25 ENCOUNTER — Ambulatory Visit (HOSPITAL_BASED_OUTPATIENT_CLINIC_OR_DEPARTMENT_OTHER)
Admission: RE | Admit: 2013-09-25 | Discharge: 2013-09-25 | Disposition: A | Discharge status: Home / Self Care | Payer: BC Managed Care – PPO | Source: Ambulatory Visit | Attending: Family Medicine | Admitting: Family Medicine

## 2013-09-25 ENCOUNTER — Ambulatory Visit (INDEPENDENT_AMBULATORY_CARE_PROVIDER_SITE_OTHER): Payer: BC Managed Care – PPO | Admitting: Family Medicine

## 2013-09-25 ENCOUNTER — Telehealth: Payer: Self-pay | Admitting: Internal Medicine

## 2013-09-25 VITALS — BP 110/60 | HR 94 | Temp 98.1°F | Wt 226.2 lb

## 2013-09-25 DIAGNOSIS — J209 Acute bronchitis, unspecified: Secondary | ICD-10-CM

## 2013-09-25 DIAGNOSIS — R059 Cough, unspecified: Secondary | ICD-10-CM

## 2013-09-25 DIAGNOSIS — R05 Cough: Secondary | ICD-10-CM

## 2013-09-25 DIAGNOSIS — E669 Obesity, unspecified: Secondary | ICD-10-CM | POA: Insufficient documentation

## 2013-09-25 MED ORDER — IPRATROPIUM-ALBUTEROL 0.5-2.5 (3) MG/3ML IN SOLN
3.0000 mL | Freq: Once | RESPIRATORY_TRACT | Status: AC
  Administered 2013-09-25: 3 mL via RESPIRATORY_TRACT

## 2013-09-25 MED ORDER — METHYLPREDNISOLONE ACETATE 80 MG/ML IJ SUSP
80.0000 mg | Freq: Once | INTRAMUSCULAR | Status: AC
  Administered 2013-09-25: 80 mg via INTRAMUSCULAR

## 2013-09-25 MED ORDER — ALBUTEROL SULFATE HFA 108 (90 BASE) MCG/ACT IN AERS: 2.0000 | INHALATION_SPRAY | Freq: Four times a day (QID) | RESPIRATORY_TRACT | Status: DC | PRN

## 2013-09-25 MED ORDER — AMOXICILLIN-POT CLAVULANATE 875-125 MG PO TABS: 1.0000 | ORAL_TABLET | Freq: Two times a day (BID) | ORAL | Status: DC

## 2013-09-25 MED ORDER — GUAIFENESIN-CODEINE 100-10 MG/5ML PO SYRP: ORAL_SOLUTION | ORAL | Status: DC

## 2013-09-25 MED ORDER — BUDESONIDE-FORMOTEROL FUMARATE 160-4.5 MCG/ACT IN AERO: 2.0000 | INHALATION_SPRAY | Freq: Two times a day (BID) | RESPIRATORY_TRACT | Status: DC

## 2013-09-25 NOTE — Patient Instructions (Signed)

## 2013-09-25 NOTE — Progress Notes (Signed)
Pre visit review using our clinic review tool, if applicable. No additional management support is needed unless otherwise documented below in the visit note. 

## 2013-09-25 NOTE — Progress Notes (Signed)
  Subjective:     Christian Petty is a 58 y.o. male here for evaluation of a cough. Onset of symptoms was 10 days ago. Symptoms have been gradually worsening since that time. The cough is productive and is aggravated by infection and reclining position. Associated symptoms include: fever, postnasal drip, shortness of breath, sputum production and wheezing. Patient does not have a history of asthma. Patient does not have a history of environmental allergens. Patient has not traveled recently. Patient does have a history of smoking. Patient has not had a previous chest x-ray. Patient has not had a PPD done.  The following portions of the patient's history were reviewed and updated as appropriate: allergies, current medications, past family history, past medical history, past social history, past surgical history and problem list.  Review of Systems Pertinent items are noted in HPI.    Objective:    Oxygen saturation 97% on room air BP 110/60  Pulse 94  Temp(Src) 98.1 F (36.7 C) (Oral)  Wt 226 lb 3.2 oz (102.604 kg)  SpO2 97% General appearance: alert, cooperative, appears stated age and no distress Head: Normocephalic, without obvious abnormality, atraumatic Ears: normal TM's and external ear canals both ears Nose: green discharge, moderate congestion, turbinates red, swollen, sinus tenderness bilateral Throat: lips, mucosa, and tongue normal; teeth and gums normal Neck: mild anterior cervical adenopathy, supple, symmetrical, trachea midline and thyroid not enlarged, symmetric, no tenderness/mass/nodules Lungs: diminished breath sounds bilaterally Heart: S1, S2 normal    Assessment:    Acute Bronchitis and Sinusitis    Plan:    Antibiotics per medication orders. Antitussives per medication orders. Avoid exposure to tobacco smoke and fumes. B-agonist inhaler. Call if shortness of breath worsens, blood in sputum, change in character of cough, development of fever or chills, inability to  maintain nutrition and hydration. Avoid exposure to tobacco smoke and fumes. Chest x-ray. Steroid inhaler as ordered. Smoking cessation. Trial of steroid nasal spray. depo medrol given

## 2013-09-25 NOTE — Telephone Encounter (Signed)
Relevant patient education mailed to patient.  

## 2013-10-22 ENCOUNTER — Telehealth: Payer: Self-pay | Admitting: Internal Medicine

## 2013-10-22 NOTE — Telephone Encounter (Signed)
Provide   discount cards for: Januvia , tradjenta, ongliza ----> pt to take cards to his pharmacy and see which one works for him supposedly his cost could be zero x 1 year If they don't work will figure another way

## 2013-10-22 NOTE — Telephone Encounter (Signed)
Advised patient of discount cards. Placed a card for Januvia and Tradjenta at front desk.

## 2013-10-22 NOTE — Telephone Encounter (Signed)
Patient wife called regarding samples of Januvia. I informed patient we did not have anymore (per Shanon Brow) and she stated that her husband really needed them. Patient would like a call back from a nurse. Please advise

## 2013-10-22 NOTE — Telephone Encounter (Signed)
Spoke with patient who states that he only has 5 days of samples left. He would like to know if he can go back on glimepiride. Feels that maybe he wasn't eating properly when he took it before. He cannot afford the [place] due to the cost of $210 per month. We do not have any more samples.  Please advise

## 2013-10-23 ENCOUNTER — Telehealth: Payer: Self-pay

## 2013-10-23 MED ORDER — SAXAGLIPTIN HCL 5 MG PO TABS: 5.0000 mg | ORAL_TABLET | Freq: Every day | ORAL | Status: DC

## 2013-10-23 NOTE — Telephone Encounter (Signed)
New Rx sent to pharmacy. Patient notified and new Rx sent to pharmacy.

## 2013-10-23 NOTE — Telephone Encounter (Signed)
Patient's spouse called back and stated the insurance will cover Onglyza ($50 q month and we have discount card) or Xigduo ($70 per month) Please advise.

## 2013-10-23 NOTE — Telephone Encounter (Signed)
D/c januvia Start onglyza 5 mg 1 po qd , send a year rx and cupon Next visit by June 2015

## 2013-11-19 ENCOUNTER — Ambulatory Visit (INDEPENDENT_AMBULATORY_CARE_PROVIDER_SITE_OTHER): Payer: BC Managed Care – PPO | Admitting: Internal Medicine

## 2013-11-19 ENCOUNTER — Encounter: Payer: Self-pay | Admitting: Internal Medicine

## 2013-11-19 VITALS — BP 114/76 | HR 80 | Temp 98.3°F | Wt 222.0 lb

## 2013-11-19 DIAGNOSIS — E119 Type 2 diabetes mellitus without complications: Secondary | ICD-10-CM

## 2013-11-19 DIAGNOSIS — F17200 Nicotine dependence, unspecified, uncomplicated: Secondary | ICD-10-CM

## 2013-11-19 DIAGNOSIS — I10 Essential (primary) hypertension: Secondary | ICD-10-CM

## 2013-11-19 NOTE — Progress Notes (Signed)
Subjective:    Patient ID: Christian Petty, male    DOB: 05/14/1956, 58 y.o.   MRN: [id number]  DOS:  11/19/2013 Type of  visit: Routine office visit Diabetes, he is doing better, see assessment and plan. Tobacco, quit tobacco 10/20/2013, still using some e- cigarettes. Hypertension, normal ambulatory BPs when checked, good compliance with medications. High cholesterol, good compliance with medicines.   ROS Denies chest pain or difficulty breathing Since he quit tobacco, he is feeling great, no cough or sputum production. Denies any numbness or tingling on his feet.   Past Medical History  Diagnosis Date  . Neuropathy 09/2009  . HTN (hypertension) 2007    BP normal as of 2013 w/o meds   . Diabetes mellitus 2008  . Chest pain     (-) cardiolite  07-2006  . Hyperlipidemia, mild     Past Surgical History  Procedure Laterality Date  . Lumbar laminectomy      x2  . Inguinal hernia repair      History   Social History  . Marital Status: Married    Spouse Name: N/A    Number of Children: 2  . Years of Education: N/A   Occupational History  . truck driver     Social History Main Topics  . Smoking status: Former Smoker -- 1.00 packs/day  . Smokeless tobacco: Never Used  . Alcohol Use: Yes     Comment: socially   . Drug Use: No  . Sexual Activity: Not on file   Other Topics Concern  . 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   Exercise- not much (walks 30 min x 2/week)         Medication List       This list is accurate as of: 11/19/13  8:58 PM.  Always use your most recent med list.               fenofibrate 160 MG tablet  TAKE 1 TABLET (160 MG TOTAL) BY MOUTH DAILY.     losartan 50 MG tablet  Commonly known as:  COZAAR  TAKE 1 TABLET (50 MG TOTAL) BY MOUTH DAILY.     metFORMIN 1000 MG tablet  Commonly known as:  GLUCOPHAGE  TAKE 1 TABLET BY MOUTH TWO TIMES DAILY WITH A MEAL.     pravastatin 20 MG tablet  Commonly known as:  PRAVACHOL  TAKE 1 TABLET (20 MG TOTAL) BY MOUTH DAILY.     saxagliptin HCl 5 MG Tabs tablet  Commonly known as:  ONGLYZA  Take 1 tablet (5 mg total) by mouth daily.           Objective:   Physical Exam BP 114/76  Pulse 80  Temp(Src) 98.3 F (36.8 C)  Wt 222 lb (100.699 kg)  SpO2 97%  General -- alert, well-developed, NAD.   Lungs -- normal respiratory effort, no intercostal retractions, no accessory muscle use, and normal breath sounds.  Heart-- normal rate, regular rhythm, no murmur.  DIABETIC FEET EXAM: No lower extremity edema Normal pedal pulses bilaterally Skin normal, nails normal, no calluses Pinprick examination of the feet normal. Neurologic--  alert & oriented X3. Speech normal, gait normal, strength normal in all extremities.  Psych-- Cognition and judgment appear intact. Cooperative with normal attention span and concentration. No anxious or depressed appearing.       Assessment & Plan:   Extensive paper work  completed today. Time spent counseling and coordinating patient care more than 25 minutes.

## 2013-11-19 NOTE — Assessment & Plan Note (Signed)
Great, quit tobacco  10/20/2013, still uses the e- cigarette

## 2013-11-19 NOTE — Assessment & Plan Note (Addendum)
After a number of problems -cost- he eventually was able to get onglyza, continue with metformin. He stopped taking the glimeperide. CBGs usually 110, 120, no further episodes of low blood sugar. Request a A1c, will do.

## 2013-11-19 NOTE — Assessment & Plan Note (Addendum)
Controlled, recent BMP normal. No change

## 2013-11-19 NOTE — Patient Instructions (Addendum)
Get your blood work before you leave   Next visit is for a physical exam in 3 months, fasting

## 2013-11-19 NOTE — Progress Notes (Signed)
Pre visit review using our clinic review tool, if applicable. No additional management support is needed unless otherwise documented below in the visit note. 

## 2013-11-20 LAB — HEMOGLOBIN A1C: HEMOGLOBIN A1C: 7.6 % — AB (ref 4.6–6.5)

## 2013-11-22 ENCOUNTER — Telehealth: Payer: Self-pay

## 2013-11-22 ENCOUNTER — Telehealth: Payer: Self-pay | Admitting: *Deleted

## 2013-11-22 NOTE — Telephone Encounter (Signed)
Pt wife called to see if paperwork was ready to be picked up.  She stated they were told they would be ready Thursday or Friday of this week.  I explained to her that paperwork had been placed in Dr. Larose Kells folder awaiting completion and his signature.  Once the paperwork is completed, we would call and let them know it was ready to be picked up.  bw

## 2013-11-22 NOTE — Telephone Encounter (Signed)
Spoke with the pt and informed him of recent lab results and note.  Pt understood and agreed.//AB/CMA

## 2013-11-22 NOTE — Telephone Encounter (Signed)
Paperwork completed and signed.  Patient was called and made aware that forms have been completed and are ready for pick up.  Original placed up front in pick up box and a copy was made, labeled and placed in batch level basket to be scanned.

## 2013-11-22 NOTE — Telephone Encounter (Addendum)
Received DOT forms.  Form completed as much as possible and placed in Dr. Ethel Rana RED quick signature folder.  Awaiting completion and signature.

## 2013-11-22 NOTE — Telephone Encounter (Signed)
Message copied by Harl Bowie on Thu Nov 22, 2013  1:58 PM ------      Message from: Kathlene November E      Created: Thu Nov 22, 2013  9:01 AM       Advise patient, diabetes needs better control.      In addition to exercise more on trying to eat healthier , recommend to increase metformin 1000 mg to 1.5 tablet twice a day ------

## 2013-11-23 ENCOUNTER — Telehealth: Payer: Self-pay | Admitting: Internal Medicine

## 2013-11-23 NOTE — Telephone Encounter (Signed)
Caller name:  Issaih Relation to pt: Call back number: [phone] Pharmacy:  Reason for call:  Pt's daughter is coming to pick up paperwork for pt this afternoon and Pt wants to know can we put a copy of his current lab results with the paperwork.  Thanks.

## 2013-11-23 NOTE — Telephone Encounter (Signed)
Placed in the patient's envelope at the front desk.  Notified patient.

## 2013-11-24 ENCOUNTER — Other Ambulatory Visit: Payer: Self-pay | Admitting: Internal Medicine

## 2013-11-26 ENCOUNTER — Other Ambulatory Visit: Payer: Self-pay | Admitting: Internal Medicine

## 2013-11-26 NOTE — Telephone Encounter (Signed)
Caller name: Christian Petty Relation to pt: self Call back [place]:  Reason for call:  Pt states that the pharmacy wanted pt to contact PCP office regarding refill of metFORMIN (GLUCOPHAGE) 1000 MG.  Pt also states that the directions of use have changed per conversation he had with CMA on 11/22/13 and script does not reflect that change.  Please contact pt

## 2013-11-26 NOTE — Telephone Encounter (Signed)
Refill e-scribed to [facility] pharmacy to reflect change in dosing (increase from 1 tablet BID to 11/2 tabs BID). JG//CMA

## 2013-11-28 ENCOUNTER — Telehealth: Payer: Self-pay

## 2013-11-28 NOTE — Telephone Encounter (Signed)
Relevant patient education mailed to patient.  

## 2013-11-29 NOTE — Telephone Encounter (Signed)
Rx sent to the pharmacy by e-script with new directions.//AB/CMA

## 2014-04-02 ENCOUNTER — Other Ambulatory Visit: Payer: Self-pay | Admitting: Internal Medicine

## 2014-05-06 ENCOUNTER — Other Ambulatory Visit: Payer: Self-pay

## 2014-05-06 MED ORDER — PRAVASTATIN SODIUM 20 MG PO TABS: ORAL_TABLET | ORAL | Status: DC

## 2014-06-07 ENCOUNTER — Other Ambulatory Visit: Payer: Self-pay | Admitting: Internal Medicine

## 2014-08-30 ENCOUNTER — Other Ambulatory Visit: Payer: Self-pay | Admitting: Internal Medicine

## 2014-11-10 ENCOUNTER — Other Ambulatory Visit: Payer: Self-pay | Admitting: Internal Medicine

## 2014-11-11 ENCOUNTER — Other Ambulatory Visit: Payer: Self-pay

## 2014-12-14 ENCOUNTER — Other Ambulatory Visit: Payer: Self-pay | Admitting: Internal Medicine

## 2014-12-17 ENCOUNTER — Other Ambulatory Visit: Payer: Self-pay

## 2014-12-17 MED ORDER — SAXAGLIPTIN HCL 5 MG PO TABS: 5.0000 mg | ORAL_TABLET | Freq: Every day | ORAL | Status: DC

## 2014-12-17 NOTE — Telephone Encounter (Signed)
E-prescribe currently down, Rx printed, awaiting MD signature.

## 2014-12-17 NOTE — Telephone Encounter (Signed)
Rx faxed to [facility] pharmacy.  

## 2014-12-18 ENCOUNTER — Other Ambulatory Visit: Payer: Self-pay | Admitting: Internal Medicine

## 2014-12-20 ENCOUNTER — Other Ambulatory Visit: Payer: Self-pay | Admitting: Internal Medicine

## 2014-12-21 ENCOUNTER — Other Ambulatory Visit: Payer: Self-pay | Admitting: Internal Medicine

## 2014-12-23 ENCOUNTER — Other Ambulatory Visit: Payer: Self-pay | Admitting: Internal Medicine

## 2014-12-23 ENCOUNTER — Other Ambulatory Visit: Payer: Self-pay

## 2015-01-04 ENCOUNTER — Other Ambulatory Visit: Payer: Self-pay | Admitting: Internal Medicine

## 2015-03-14 ENCOUNTER — Other Ambulatory Visit: Payer: Self-pay | Admitting: Nurse Practitioner

## 2015-03-14 ENCOUNTER — Ambulatory Visit
Admission: RE | Admit: 2015-03-14 | Discharge: 2015-03-14 | Disposition: A | Discharge status: Home / Self Care | Payer: Self-pay | Source: Ambulatory Visit | Attending: Nurse Practitioner | Admitting: Nurse Practitioner

## 2015-03-14 DIAGNOSIS — M545 Low back pain: Secondary | ICD-10-CM

## 2015-12-17 DIAGNOSIS — E119 Type 2 diabetes mellitus without complications: Secondary | ICD-10-CM | POA: Diagnosis not present

## 2015-12-17 DIAGNOSIS — I10 Essential (primary) hypertension: Secondary | ICD-10-CM | POA: Diagnosis not present

## 2015-12-17 DIAGNOSIS — E559 Vitamin D deficiency, unspecified: Secondary | ICD-10-CM | POA: Diagnosis not present

## 2015-12-17 DIAGNOSIS — E785 Hyperlipidemia, unspecified: Secondary | ICD-10-CM | POA: Diagnosis not present

## 2015-12-17 DIAGNOSIS — E1165 Type 2 diabetes mellitus with hyperglycemia: Secondary | ICD-10-CM | POA: Diagnosis not present

## 2015-12-17 DIAGNOSIS — E7800 Pure hypercholesterolemia, unspecified: Secondary | ICD-10-CM | POA: Diagnosis not present

## 2015-12-17 DIAGNOSIS — N400 Enlarged prostate without lower urinary tract symptoms: Secondary | ICD-10-CM | POA: Diagnosis not present

## 2015-12-30 DIAGNOSIS — E119 Type 2 diabetes mellitus without complications: Secondary | ICD-10-CM | POA: Diagnosis not present

## 2016-05-31 DIAGNOSIS — E559 Vitamin D deficiency, unspecified: Secondary | ICD-10-CM | POA: Diagnosis not present

## 2016-05-31 DIAGNOSIS — E119 Type 2 diabetes mellitus without complications: Secondary | ICD-10-CM | POA: Diagnosis not present

## 2016-05-31 DIAGNOSIS — E785 Hyperlipidemia, unspecified: Secondary | ICD-10-CM | POA: Diagnosis not present

## 2016-05-31 DIAGNOSIS — I10 Essential (primary) hypertension: Secondary | ICD-10-CM | POA: Diagnosis not present

## 2016-07-29 ENCOUNTER — Ambulatory Visit (INDEPENDENT_AMBULATORY_CARE_PROVIDER_SITE_OTHER): Payer: BLUE CROSS/BLUE SHIELD | Admitting: Primary Care

## 2016-07-29 ENCOUNTER — Other Ambulatory Visit: Payer: Self-pay | Admitting: Primary Care

## 2016-07-29 ENCOUNTER — Encounter: Payer: Self-pay | Admitting: Primary Care

## 2016-07-29 VITALS — BP 134/88 | HR 82 | Temp 97.4°F | Ht 71.0 in | Wt 223.4 lb

## 2016-07-29 DIAGNOSIS — F17200 Nicotine dependence, unspecified, uncomplicated: Secondary | ICD-10-CM

## 2016-07-29 DIAGNOSIS — I10 Essential (primary) hypertension: Secondary | ICD-10-CM

## 2016-07-29 DIAGNOSIS — E119 Type 2 diabetes mellitus without complications: Secondary | ICD-10-CM | POA: Diagnosis not present

## 2016-07-29 DIAGNOSIS — E785 Hyperlipidemia, unspecified: Secondary | ICD-10-CM

## 2016-07-29 DIAGNOSIS — E1129 Type 2 diabetes mellitus with other diabetic kidney complication: Secondary | ICD-10-CM

## 2016-07-29 DIAGNOSIS — R809 Proteinuria, unspecified: Principal | ICD-10-CM

## 2016-07-29 LAB — LIPID PANEL
Cholesterol: 102 mg/dL (ref 0–200)
HDL: 22.1 mg/dL — ABNORMAL LOW (ref >39.00)
NonHDL: 80.37
TRIGLYCERIDES: 283 mg/dL — AB (ref 0.0–149.0)
Total CHOL/HDL Ratio: 5
VLDL: 56.6 mg/dL — ABNORMAL HIGH (ref 0.0–40.0)

## 2016-07-29 LAB — COMPREHENSIVE METABOLIC PANEL
ALBUMIN: 4.6 g/dL (ref 3.5–5.2)
ALK PHOS: 29 U/L — AB (ref 39–117)
ALT: 59 U/L — ABNORMAL HIGH (ref 0–53)
AST: 35 U/L (ref 0–37)
BILIRUBIN TOTAL: 0.6 mg/dL (ref 0.2–1.2)
BUN: 19 mg/dL (ref 6–23)
CO2: 30 mEq/L (ref 19–32)
Calcium: 10.2 mg/dL (ref 8.4–10.5)
Chloride: 101 mEq/L (ref 96–112)
Creatinine, Ser: 1.1 mg/dL (ref 0.40–1.50)
GFR: 72.19 mL/min (ref >60.00)
Glucose, Bld: 231 mg/dL — ABNORMAL HIGH (ref 70–99)
POTASSIUM: 4 meq/L (ref 3.5–5.1)
Sodium: 139 mEq/L (ref 135–145)
TOTAL PROTEIN: 7.2 g/dL (ref 6.0–8.3)

## 2016-07-29 LAB — MICROALBUMIN / CREATININE URINE RATIO
Creatinine,U: 139.2 mg/dL
MICROALB UR: 2.9 mg/dL — AB (ref 0.0–1.9)
Microalb Creat Ratio: 2.1 mg/g (ref 0.0–30.0)

## 2016-07-29 LAB — HEMOGLOBIN A1C: Hgb A1c MFr Bld: 9 % — ABNORMAL HIGH (ref 4.6–6.5)

## 2016-07-29 LAB — LDL CHOLESTEROL, DIRECT: LDL DIRECT: 49 mg/dL

## 2016-07-29 NOTE — Assessment & Plan Note (Signed)
No recent A1c on file, pending today. Urine microalbumin pending. Suspect A1c to be above goal given [place] blood sugar readings. Will adjust medication accordingly.

## 2016-07-29 NOTE — Assessment & Plan Note (Signed)
Stable in the office today, continue losartan 50 mg.  BMP pending. 

## 2016-07-29 NOTE — Patient Instructions (Signed)
Complete lab work prior to leaving today.   It is important that you improve your diet. Please limit carbohydrates in the form of white bread, rice, pasta, sweets, fast food, fried food, sugary drinks, etc. Increase your consumption of fresh fruits and vegetables, whole grains, lean protein.  Ensure you are consuming 64 ounces of water daily.  Start exercising. You should be getting 150 minutes of moderate intensity exercise weekly.  We will be in touch with you tomorrow once we receive your lab results.  It was a pleasure to meet you today! Please don't hesitate to call me with any questions. Welcome to [facility]!  Diabetes Mellitus and Food It is important for you to manage your blood sugar (glucose) level. Your blood glucose level can be greatly affected by what you eat. Eating healthier foods in the appropriate amounts throughout the day at about the same time each day will help you control your blood glucose level. It can also help slow or prevent worsening of your diabetes mellitus. Healthy eating may even help you improve the level of your blood pressure and reach or maintain a healthy weight. General recommendations for healthful eating and cooking habits include:  Eating meals and snacks regularly. Avoid going long periods of time without eating to lose weight.  Eating a diet that consists mainly of plant-based foods, such as fruits, vegetables, nuts, legumes, and whole grains.  Using low-heat cooking methods, such as baking, instead of high-heat cooking methods, such as deep frying. Work with your dietitian to make sure you understand how to use the Nutrition Facts information on food labels. How can food affect me? Carbohydrates  Carbohydrates affect your blood glucose level more than any other type of food. Your dietitian will help you determine how many carbohydrates to eat at each meal and teach you how to count carbohydrates. Counting carbohydrates is important to keep your  blood glucose at a healthy level, especially if you are using insulin or taking certain medicines for diabetes mellitus. Alcohol  Alcohol can cause sudden decreases in blood glucose (hypoglycemia), especially if you use insulin or take certain medicines for diabetes mellitus. Hypoglycemia can be a life-threatening condition. Symptoms of hypoglycemia (sleepiness, dizziness, and disorientation) are similar to symptoms of having too much alcohol. If your health care provider has given you approval to drink alcohol, do so in moderation and use the following guidelines:  Women should not have more than one drink per day, and men should not have more than two drinks per day. One drink is equal to:  12 oz of beer.  5 oz of wine.  1 oz of hard liquor.  Do not drink on an empty stomach.  Keep yourself hydrated. Have water, diet soda, or unsweetened iced tea.  Regular soda, juice, and other mixers might contain a lot of carbohydrates and should be counted. What foods are not recommended? As you make food choices, it is important to remember that all foods are not the same. Some foods have fewer nutrients per serving than other foods, even though they might have the same number of calories or carbohydrates. It is difficult to get your body what it needs when you eat foods with fewer nutrients. Examples of foods that you should avoid that are high in calories and carbohydrates but low in nutrients include:  Trans fats (most processed foods list trans fats on the Nutrition Facts label).  Regular soda.  Juice.  Candy.  Sweets, such as cake, pie, doughnuts, and cookies.  Fried foods. What foods can I eat? Eat nutrient-rich foods, which will nourish your body and keep you healthy. The food you should eat also will depend on several factors, including:  The calories you need.  The medicines you take.  Your weight.  Your blood glucose level.  Your blood pressure level.  Your cholesterol  level. You should eat a variety of foods, including:  Protein.  Lean cuts of meat.  Proteins low in saturated fats, such as fish, egg whites, and beans. Avoid processed meats.  Fruits and vegetables.  Fruits and vegetables that may help control blood glucose levels, such as apples, mangoes, and yams.  Dairy products.  Choose fat-free or low-fat dairy products, such as milk, yogurt, and cheese.  Grains, bread, pasta, and rice.  Choose whole grain products, such as multigrain bread, whole oats, and brown rice. These foods may help control blood pressure.  Fats.  Foods containing healthful fats, such as nuts, avocado, olive oil, canola oil, and fish. Does everyone with diabetes mellitus have the same meal plan? Because every person with diabetes mellitus is different, there is not one meal plan that works for everyone. It is very important that you meet with a dietitian who will help you create a meal plan that is just right for you. This information is not intended to replace advice given to you by your health care provider. Make sure you discuss any questions you have with your health care provider. Document Released: 04/01/2005 Document Revised: 12/11/2015 Document Reviewed: 06/01/2013 Elsevier Interactive Patient Education  2017 [employer].

## 2016-07-29 NOTE — Assessment & Plan Note (Signed)
No recent lipid panel on file. Lipids pending today, continue fenofibrate and pravastatin for now.

## 2016-07-29 NOTE — Progress Notes (Signed)
Pre visit review using our clinic review tool, if applicable. No additional management support is needed unless otherwise documented below in the visit note. 

## 2016-07-29 NOTE — Progress Notes (Signed)
Subjective:    Patient ID: Christian Petty, male    DOB: 1955/10/28, 61 y.o.   MRN: [id number]  HPI  Christian Petty is a 61 year old male who presents today to establish care and discuss the problems mentioned below. Will obtain old records.  1) Type 2 Diabetes: Diagnosed 4-5 years ago. Currently managed on saxagliptin 5 mg daily and metformin 1500 mg every morning and 1000 mg HS. He checks his sugars twice weekly and gets readings of low 200's fasting. He denies numbness/tingling, dizziness. He does feel fatigued. 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 He does not exercise.  2) Hyperlipidemia: Currently managed on fenofibrate 160 mg and pravastatin 20 mg. He denies myalgias. He's unsure of the timing of his last cholesterol check.  3) Essential Hypertension: Currently managed on losartan 50 mg. His BP in the office today is 134/88. He denies chest pain, visual changes, dizziness.  Review of Systems  Eyes: Negative for visual disturbance.  Respiratory: Negative for shortness of breath.   Cardiovascular: Negative for chest pain.  Endocrine: Negative for polydipsia and polyuria.  Musculoskeletal: Negative for myalgias.  Neurological: Negative for dizziness and numbness.       Past Medical History:  Diagnosis Date  . Chest pain    (-) cardiolite  07-2006  . Diabetes mellitus 2008  . HTN (hypertension) 2007   BP normal as of 2013 w/o meds   . Hyperlipidemia, mild   . Neuropathy ([place]) 09/2009     Social History   Social History  . Marital status: Married    Spouse name: N/A  . Number of children: 2  . Years of education: N/A   Occupational History  . truck driver     Social History Main Topics  . Smoking status: Former Smoker    Packs/day: 1.00  . Smokeless tobacco: Never Used  . Alcohol use Yes     Comment: socially   . Drug use: No  . Sexual activity: Not on file   Other Topics Concern  . Not on file   Social History Narrative   Married.   2 children, 2 grandchildren.   Works for himself as a Administrator.       Past Surgical History:  Procedure Laterality Date  . INGUINAL HERNIA REPAIR    . LUMBAR LAMINECTOMY     x2    Family History  Problem Relation Age of Onset  . Diabetes Father   . Breast cancer Mother   . Stroke  7    F  . Hypertension Neg Hx   . Prostate cancer Neg Hx   . Colon cancer Neg Hx   . Coronary artery disease Neg Hx     No Known Allergies  Current Outpatient Prescriptions on File Prior to Visit  Medication Sig Dispense Refill  . fenofibrate 160 MG tablet TAKE 1 TABLET (160 MG TOTAL) BY MOUTH DAILY. 90 tablet 1  . losartan (COZAAR) 50 MG tablet TAKE 1 TABLET (50 MG TOTAL) BY MOUTH DAILY. 90 tablet 1  . metFORMIN (GLUCOPHAGE) 1000 MG tablet Take 1 and 1/2 tablet twice daily. DUE FOR APPT WITH DR PAZ. [id number]. (Patient taking differently: Take 1 and 1/2 tablet twice daily in the morning. Take 1 tablet daily at bedtime) 90 tablet 1  . pravastatin (PRAVACHOL) 20 MG tablet TAKE 1 TABLET (20 MG TOTAL) BY MOUTH DAILY. DUE FOR APPT WITH DR PAZ FOR ANY FURTHER REFILLS [phone]. (  Patient taking differently: Take 20 mg by mouth daily. TAKE 1 TABLET (20 MG TOTAL) BY MOUTH DAILY.) 30 tablet 2  . saxagliptin HCl (ONGLYZA) 5 MG TABS tablet Take 1 tablet (5 mg total) by mouth daily. 15 tablet 0   No current facility-administered medications on file prior to visit.     BP 134/88   Pulse 82   Temp 97.4 F (36.3 C) (Oral)   Ht 5\' 11"  (1.803 m)   Wt 223 lb 6.4 oz (101.3 kg)   SpO2 97%   BMI 31.16 kg/m    Objective:   Physical Exam  Constitutional: He is oriented to person, place, and time. He appears well-nourished.  Neck: Neck supple.  Cardiovascular: Normal rate and regular rhythm.   Pulmonary/Chest: Effort normal and breath sounds normal. He has no wheezes. He has no rales.  Neurological: He is alert and oriented to person, place, and time.  Skin: Skin is warm and dry.  Psychiatric:  He has a normal mood and affect.          Assessment & Plan:

## 2016-07-29 NOTE — Assessment & Plan Note (Signed)
Quit smoking 4-5 years ago, commended him on this.

## 2016-08-02 ENCOUNTER — Other Ambulatory Visit: Payer: Self-pay | Admitting: Primary Care

## 2016-08-02 DIAGNOSIS — E1129 Type 2 diabetes mellitus with other diabetic kidney complication: Secondary | ICD-10-CM

## 2016-08-02 DIAGNOSIS — R809 Proteinuria, unspecified: Principal | ICD-10-CM

## 2016-08-02 MED ORDER — GLIPIZIDE 5 MG PO TABS: 5.0000 mg | ORAL_TABLET | Freq: Two times a day (BID) | ORAL | 0 refills | Status: DC

## 2016-08-03 ENCOUNTER — Encounter: Payer: Self-pay | Admitting: *Deleted

## 2016-08-06 ENCOUNTER — Telehealth: Payer: Self-pay | Admitting: Primary Care

## 2016-08-06 NOTE — Telephone Encounter (Signed)
Received a pharmacy record from [facility] with current and prior medications. It looks like he was prescribed several medications which are not on his med list. Please clarify with patient and complete med req. Ask him specifically about Glimepiride, Trulicity, and rosuvastatin which are current medications that are not on our med list. These were prescribed by Delia Chimes.

## 2016-08-06 NOTE — Telephone Encounter (Signed)
Spoken and notified patient of Kate's comments. Patient verbalized understanding. 

## 2016-08-06 NOTE — Telephone Encounter (Signed)
Spoken to patient.  Patient stated that he is taking glimepiride 5 mg once a day. Patient is not taking Trulicity because he could afford it. Patient stated he is not sure about the rosuvastatin (Crestor) but he does not have the bottle.

## 2016-08-06 NOTE — Telephone Encounter (Signed)
He cannot take both glimepiride and glipizide at once. Just take the Glipizide 5 mg BID that I added, not glimepiride 2 mg.

## 2016-08-16 ENCOUNTER — Ambulatory Visit (INDEPENDENT_AMBULATORY_CARE_PROVIDER_SITE_OTHER): Payer: BLUE CROSS/BLUE SHIELD | Admitting: Primary Care

## 2016-08-16 ENCOUNTER — Encounter: Payer: Self-pay | Admitting: Primary Care

## 2016-08-16 VITALS — BP 136/86 | HR 95 | Temp 98.2°F | Ht 71.0 in | Wt 224.8 lb

## 2016-08-16 DIAGNOSIS — N400 Enlarged prostate without lower urinary tract symptoms: Secondary | ICD-10-CM

## 2016-08-16 DIAGNOSIS — E119 Type 2 diabetes mellitus without complications: Secondary | ICD-10-CM

## 2016-08-16 DIAGNOSIS — Z1211 Encounter for screening for malignant neoplasm of colon: Secondary | ICD-10-CM | POA: Diagnosis not present

## 2016-08-16 NOTE — Assessment & Plan Note (Addendum)
Clarified medication list and medications. He is taking metformin and glipizide only for diabetes. Commended him on changes in his diet, encouraged him to continue. Managed on ARB for renal protection and statin for CVD protection.  Diaphoresis as a side effect of metformin, seems like he's started experiencing the sweating around the same time he started taking metformin. Low suspicion for cardiac involvement given lack of other symptoms. We'll continue to monitor.

## 2016-08-16 NOTE — Patient Instructions (Addendum)
Continue Metformin 1500 mg in the morning and 1000 mg at bedtime.  Continue Glipizide 5 mg twice daily.  Continue to make improvements in your diet as discussed.  Schedule a follow up visit after April 11th 2018 for follow up of diabetes.  It was a pleasure to see you today!

## 2016-08-16 NOTE — Progress Notes (Signed)
Pre visit review using our clinic review tool, if applicable. No additional management support is needed unless otherwise documented below in the visit note. 

## 2016-08-16 NOTE — Assessment & Plan Note (Signed)
Managed on Flomax for years, much improvement. Also with history of erectile dysfunction.

## 2016-08-16 NOTE — Progress Notes (Signed)
Subjective:    Patient ID: Christian Petty, male    DOB: 1956/05/27, 61 y.o.   MRN: [id number]  HPI  Christian Petty is a 62 year old male with a history of type 2 diabetes who presents today with for medication review/management and to mention chronic sweating. He presented as a new patient on 07/29/16. He wasn't sure of his current medications during that appointment. We received a med list from his pharmacy which had several medications that were not mentioned during his new patient appointment. His A1C during that appointment was 9.0 with a positive urine microalbumin, so he was initiated on Glipizide 5 mg BID and told to continue Metformin 1500 mg AM and 1000 mg HS, and losartan from prior PCP.   Since his last visit he's not checked his blood sugars as he had to replace his machine. His last fasting sugar sometime last week was 170. 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 He's been working on improving his diet by eating more salads and is reducing his snacking at bedtime, and is reducing his consumption of sweets. Overall he's feeling great. Over the past 2 years he's noticed sudden episodes of sweating to the back of his neck. He denies headaches, chest, pain, dizziness, nausea. He will randomly break out into sweats, only to the back of his neck. This has not become worse or more frequent.  2) BPH: Diagnosed years ago. He experienced a weak urinary stream and nocturia prior to treatment. He's currently managed on Flomax daily with significant improvement. He forgot to mention this during his initial visit several weeks ago.  Review of Systems  Constitutional: Negative for fatigue, fever and unexpected weight change.  Respiratory: Negative for cough and shortness of breath.   Cardiovascular: Negative for chest pain.  Gastrointestinal: Negative for nausea.  Genitourinary: Negative for difficulty urinating.  Neurological: Negative for numbness.       Past Medical History:  Diagnosis  Date  . BPH (benign prostatic hyperplasia)   . Chest pain    (-) cardiolite  07-2006  . Diabetes mellitus 2008  . HTN (hypertension) 2007   BP normal as of 2013 w/o meds   . Hyperlipidemia, mild   . Neuropathy ([place]) 09/2009     Social History   Social History  . Marital status: Married    Spouse name: N/A  . Number of children: 2  . Years of education: N/A   Occupational History  . truck driver     Social History Main Topics  . Smoking status: Former Smoker    Packs/day: 1.00  . Smokeless tobacco: Never Used  . Alcohol use Yes     Comment: socially   . Drug use: No  . Sexual activity: Not on file   Other Topics Concern  . Not on file   Social History Narrative   Married.   2 children, 2 grandchildren.   Works for himself as a Administrator.       Past Surgical History:  Procedure Laterality Date  . INGUINAL HERNIA REPAIR    . LUMBAR LAMINECTOMY     x2    Family History  Problem Relation Age of Onset  . Diabetes Father   . Breast cancer Mother   . Stroke  71    F  . Hypertension Neg Hx   . Prostate cancer Neg Hx   . Colon cancer Neg Hx   . Coronary artery disease Neg Hx  No Known Allergies  Current Outpatient Prescriptions on File Prior to Visit  Medication Sig Dispense Refill  . fenofibrate 160 MG tablet TAKE 1 TABLET (160 MG TOTAL) BY MOUTH DAILY. 90 tablet 1  . glipiZIDE (GLUCOTROL) 5 MG tablet Take 1 tablet (5 mg total) by mouth 2 (two) times daily before a meal. 180 tablet 0  . losartan (COZAAR) 50 MG tablet TAKE 1 TABLET (50 MG TOTAL) BY MOUTH DAILY. 90 tablet 1  . metFORMIN (GLUCOPHAGE) 1000 MG tablet Take 1 and 1/2 tablet twice daily. DUE FOR APPT WITH DR PAZ. [id number]. (Patient taking differently: Take 1 tablet ans 1/2 daily in the morning. Take 1 tablet  daily at bedtime) 90 tablet 1  . pravastatin (PRAVACHOL) 20 MG tablet TAKE 1 TABLET (20 MG TOTAL) BY MOUTH DAILY. DUE FOR APPT WITH DR PAZ FOR ANY FURTHER REFILLS [phone]. (Patient taking  differently: Take 20 mg by mouth daily. TAKE 1 TABLET (20 MG TOTAL) BY MOUTH DAILY.) 30 tablet 2   No current facility-administered medications on file prior to visit.     BP 136/86   Pulse 95   Temp 98.2 F (36.8 C) (Oral)   Ht 5\' 11"  (1.803 m)   Wt 224 lb 12.8 oz (102 kg)   SpO2 97%   BMI 31.35 kg/m    Objective:   Physical Exam  Constitutional: He appears well-nourished.  Neck: Neck supple.  Cardiovascular: Normal rate and regular rhythm.   Murmur heard. Pulmonary/Chest: Effort normal and breath sounds normal.  Skin: Skin is warm and dry.          Assessment & Plan:

## 2016-08-19 ENCOUNTER — Encounter: Payer: Self-pay | Admitting: Gastroenterology

## 2016-09-07 ENCOUNTER — Other Ambulatory Visit: Payer: Self-pay | Admitting: Primary Care

## 2016-09-07 MED ORDER — TAMSULOSIN HCL 0.4 MG PO CAPS: 0.4000 mg | ORAL_CAPSULE | Freq: Every day | ORAL | 5 refills | Status: DC

## 2016-09-07 NOTE — Telephone Encounter (Signed)
Received faxed refill request for tamsulosin (FLOMAX) 0.4 MG CAPS capsule. Last prescribed on 09/06/2016.

## 2016-09-20 ENCOUNTER — Encounter: Payer: Self-pay | Admitting: Gastroenterology

## 2016-09-20 ENCOUNTER — Ambulatory Visit (AMBULATORY_SURGERY_CENTER): Payer: Self-pay | Admitting: *Deleted

## 2016-09-20 VITALS — Ht 71.0 in | Wt 220.2 lb

## 2016-09-20 DIAGNOSIS — Z1211 Encounter for screening for malignant neoplasm of colon: Secondary | ICD-10-CM

## 2016-09-20 MED ORDER — NA SULFATE-K SULFATE-MG SULF 17.5-3.13-1.6 GM/177ML PO SOLN: ORAL | 0 refills | Status: DC

## 2016-09-20 NOTE — Progress Notes (Signed)
No egg or soy allergy  No anesthesia or intubation problems per pt  No diet medications taken  No home oxygen used or hx of sleep apnea 

## 2016-09-21 DIAGNOSIS — H524 Presbyopia: Secondary | ICD-10-CM | POA: Diagnosis not present

## 2016-09-21 DIAGNOSIS — E119 Type 2 diabetes mellitus without complications: Secondary | ICD-10-CM | POA: Diagnosis not present

## 2016-09-21 DIAGNOSIS — H53001 Unspecified amblyopia, right eye: Secondary | ICD-10-CM | POA: Diagnosis not present

## 2016-09-21 DIAGNOSIS — H2513 Age-related nuclear cataract, bilateral: Secondary | ICD-10-CM | POA: Diagnosis not present

## 2016-09-21 LAB — HM DIABETES EYE EXAM

## 2016-09-27 ENCOUNTER — Telehealth: Payer: Self-pay | Admitting: Gastroenterology

## 2016-09-27 ENCOUNTER — Telehealth: Payer: Self-pay | Admitting: *Deleted

## 2016-09-27 MED ORDER — NA SULFATE-K SULFATE-MG SULF 17.5-3.13-1.6 GM/177ML PO SOLN: 1.0000 | Freq: Once | ORAL | 0 refills | Status: AC

## 2016-09-27 NOTE — Telephone Encounter (Signed)
Called [facility] [place] and notified them pt showed up in the [facility] and obtained a sample prep   Medication Samples have been provided to the patient.  Drug name: suprep       Strength: suprep         Qty: 1  LOT: [id number]  Exp.Date: 2-20  Dosing instructions: as directed   The patient has been instructed regarding the correct time, dose, and frequency of taking this medication, including desired effects and most common side effects.   Hetty Ely Widener 11:51 AM 09/27/2016

## 2016-09-27 NOTE — Telephone Encounter (Signed)
Pt aware.

## 2016-09-27 NOTE — Telephone Encounter (Signed)
Called pt's pharmacy in [place] and they do not have suprep in stock for him to pick up as pt just went this morning to get his script. .  Called pt and he wants the prep [facility] [place].  Sent in order.  Spoke to pharmacist and gave coupon information over the phone to get at 50$. Pt aware.   Marijean Niemann   Suprep coupon information -- BIN   [id number] PCN   [id number] GRP   [id number] ID    [id number]

## 2016-09-28 ENCOUNTER — Ambulatory Visit (AMBULATORY_SURGERY_CENTER): Payer: BLUE CROSS/BLUE SHIELD | Admitting: Gastroenterology

## 2016-09-28 ENCOUNTER — Encounter: Payer: Self-pay | Admitting: Gastroenterology

## 2016-09-28 VITALS — BP 157/82 | HR 76 | Temp 98.4°F | Resp 14 | Ht 71.0 in | Wt 220.0 lb

## 2016-09-28 DIAGNOSIS — D122 Benign neoplasm of ascending colon: Secondary | ICD-10-CM

## 2016-09-28 DIAGNOSIS — Z1212 Encounter for screening for malignant neoplasm of rectum: Secondary | ICD-10-CM

## 2016-09-28 DIAGNOSIS — Z1211 Encounter for screening for malignant neoplasm of colon: Secondary | ICD-10-CM | POA: Diagnosis not present

## 2016-09-28 DIAGNOSIS — D128 Benign neoplasm of rectum: Secondary | ICD-10-CM | POA: Diagnosis not present

## 2016-09-28 DIAGNOSIS — D123 Benign neoplasm of transverse colon: Secondary | ICD-10-CM | POA: Diagnosis not present

## 2016-09-28 HISTORY — PX: COLONOSCOPY WITH PROPOFOL: SHX5780

## 2016-09-28 MED ORDER — SODIUM CHLORIDE 0.9 % IV SOLN: 500.0000 mL | INTRAVENOUS | Status: DC

## 2016-09-28 NOTE — Progress Notes (Signed)
Report to PACU, RN, vss, BBS= Clear.  

## 2016-09-28 NOTE — Progress Notes (Signed)
Pt's states no medical or surgical changes since previsit or office visit. 

## 2016-09-28 NOTE — Op Note (Signed)
[facility] Patient Name: Ayinde Swim Procedure Date: 09/28/2016 3:15 PM MRN: [id number] Endoscopist: Mauri Pole , MD Age: 61 Referring MD:  Date of Birth: 02/21/1956 Gender: Male Account #: [id number] Procedure:                Colonoscopy Indications:              Screening for colorectal malignant neoplasm, Last                            colonoscopy: 2007 Medicines:                Monitored Anesthesia Care Procedure:                Pre-Anesthesia Assessment:                           - Prior to the procedure, a History and Physical                            was performed, and patient medications and                            allergies were reviewed. The patient's tolerance of                            previous anesthesia was also reviewed. The risks                            and benefits of the procedure and the sedation                            options and risks were discussed with the patient.                            All questions were answered, and informed consent                            was obtained. Prior Anticoagulants: The patient has                            taken no previous anticoagulant or antiplatelet                            agents. ASA Grade Assessment: II - A patient with                            mild systemic disease. After reviewing the risks                            and benefits, the patient was deemed in                            satisfactory condition to undergo the procedure.  After obtaining informed consent, the colonoscope                            was passed under direct vision. Throughout the                            procedure, the patient's blood pressure, pulse, and                            oxygen saturations were monitored continuously. The                            Model CF-[id number]) scope was introduced                            through the anus and advanced to the the  terminal                            ileum, with identification of the appendiceal                            orifice and IC valve. The colonoscopy was performed                            without difficulty. The patient tolerated the                            procedure well. The quality of the bowel                            preparation was excellent. The terminal ileum,                            ileocecal valve, appendiceal orifice, and rectum                            were photographed. Scope In: 3:17:40 PM Scope Out: 3:32:50 PM Scope Withdrawal Time: 0 hours 11 minutes 23 seconds  Total Procedure Duration: 0 hours 15 minutes 10 seconds  Findings:                 The perianal and digital rectal examinations were                            normal.                           Two sessile polyps were found in the transverse                            colon and ascending colon. The polyps were 3 to 5                            mm in size. These polyps were removed with a cold  snare. Resection and retrieval were complete.                           A 2 mm polyp was found in the transverse colon. The                            polyp was sessile. The polyp was removed with a                            cold biopsy forceps. Resection and retrieval were                            complete.                           A 10 mm polyp was found in the rectum. The polyp                            was pedunculated. The polyp was removed with a hot                            snare. Resection and retrieval were complete.                           Multiple small and large-mouthed diverticula were                            found in the entire colon.                           Non-bleeding internal hemorrhoids were found during                            retroflexion. The hemorrhoids were large. Complications:            No immediate complications. Estimated Blood Loss:      Estimated blood loss was minimal. Impression:               - Two 3 to 5 mm polyps in the transverse colon and                            in the ascending colon, removed with a cold snare.                            Resected and retrieved.                           - One 2 mm polyp in the transverse colon, removed                            with a cold biopsy forceps. Resected and retrieved.                           - One 10 mm polyp in the  rectum, removed with a hot                            snare. Resected and retrieved.                           - Diverticulosis in the entire examined colon.                           - Non-bleeding internal hemorrhoids. Recommendation:           - Patient has a contact number available for                            emergencies. The signs and symptoms of potential                            delayed complications were discussed with the                            patient. Return to normal activities tomorrow.                            Written discharge instructions were provided to the                            patient.                           - Resume previous diet.                           - Continue present medications.                           - Await pathology results.                           - Repeat colonoscopy in 3 - 5 years for                            surveillance based on pathology results.                           - Return to GI clinic PRN. Mauri Pole, MD 09/28/2016 3:37:41 PM This report has been signed electronically.

## 2016-09-28 NOTE — Patient Instructions (Signed)
YOU HAD AN ENDOSCOPIC PROCEDURE TODAY AT THE [facility]:   Refer to the procedure report that was given to you for any specific questions about what was found during the examination.  If the procedure report does not answer your questions, please call your gastroenterologist to clarify.  If you requested that your care partner not be given the details of your procedure findings, then the procedure report has been included in a sealed envelope for you to review at your convenience later.  YOU SHOULD EXPECT: Some feelings of bloating in the abdomen. Passage of more gas than usual.  Walking can help get rid of the air that was put into your GI tract during the procedure and reduce the bloating. If you had a lower endoscopy (such as a colonoscopy or flexible sigmoidoscopy) you may notice spotting of blood in your stool or on the toilet paper. If you underwent a bowel prep for your procedure, you may not have a normal bowel movement for a few days.  Please Note:  You might notice some irritation and congestion in your nose or some drainage.  This is from the oxygen used during your procedure.  There is no need for concern and it should clear up in a day or so.  SYMPTOMS TO REPORT IMMEDIATELY:   Following lower endoscopy (colonoscopy or flexible sigmoidoscopy):  Excessive amounts of blood in the stool  Significant tenderness or worsening of abdominal pains  Swelling of the abdomen that is new, acute  Fever of 100F or higher   For urgent or emergent issues, a gastroenterologist can be reached at any hour by calling [phone].   DIET:  We do recommend a small meal at first, but then you may proceed to your regular diet.  Drink plenty of fluids but you should avoid alcoholic beverages for 24 hours.  ACTIVITY:  You should plan to take it easy for the rest of today and you should NOT DRIVE or use heavy machinery until tomorrow (because of the sedation medicines used during the test).     FOLLOW UP: Our staff will call the number listed on your records the next business day following your procedure to check on you and address any questions or concerns that you may have regarding the information given to you following your procedure. If we do not reach you, we will leave a message.  However, if you are feeling well and you are not experiencing any problems, there is no need to return our call.  We will assume that you have returned to your regular daily activities without incident.  If any biopsies were taken you will be contacted by phone or by letter within the next 1-3 weeks.  Please call us at [phone] if you have not heard about the biopsies in 3 weeks.    SIGNATURES/CONFIDENTIALITY: You and/or your care partner have signed paperwork which will be entered into your electronic medical record.  These signatures attest to the fact that that the information above on your After Visit Summary has been reviewed and is understood.  Full responsibility of the confidentiality of this discharge information lies with you and/or your care-partner.  Polyps, diverticulosis, and hemorrhoid information given. 

## 2016-09-28 NOTE — Progress Notes (Signed)
Called to room to assist during endoscopic procedure.  Patient ID and intended procedure confirmed with present staff. Received instructions for my participation in the procedure from the performing physician.  

## 2016-09-29 ENCOUNTER — Telehealth: Payer: Self-pay | Admitting: *Deleted

## 2016-09-29 NOTE — Telephone Encounter (Signed)
  Follow up Call-  Call back number 09/28/2016  Post procedure Call Back phone  # [phone]  Permission to leave phone message Yes  Some recent data might be hidden     Patient questions:  Do you have a fever, pain , or abdominal swelling? No. Pain Score  0 *  Have you tolerated food without any problems? Yes.    Have you been able to return to your normal activities? Yes.    Do you have any questions about your discharge instructions: Diet   No. Medications  No. Follow up visit  No.  Do you have questions or concerns about your Care? No.  Actions: * If pain score is 4 or above: No action needed, pain <4.

## 2016-10-01 ENCOUNTER — Encounter: Payer: Self-pay | Admitting: Gastroenterology

## 2016-10-04 ENCOUNTER — Encounter: Payer: BLUE CROSS/BLUE SHIELD | Admitting: Gastroenterology

## 2016-10-22 ENCOUNTER — Other Ambulatory Visit: Payer: Self-pay | Admitting: Primary Care

## 2016-10-22 DIAGNOSIS — R809 Proteinuria, unspecified: Principal | ICD-10-CM

## 2016-10-22 DIAGNOSIS — E1129 Type 2 diabetes mellitus with other diabetic kidney complication: Secondary | ICD-10-CM

## 2016-11-02 ENCOUNTER — Other Ambulatory Visit: Payer: Self-pay | Admitting: Primary Care

## 2016-11-02 DIAGNOSIS — E782 Mixed hyperlipidemia: Secondary | ICD-10-CM

## 2016-11-02 MED ORDER — PRAVASTATIN SODIUM 20 MG PO TABS: ORAL_TABLET | ORAL | 1 refills | Status: DC

## 2016-11-02 MED ORDER — FENOFIBRATE 160 MG PO TABS: ORAL_TABLET | ORAL | 1 refills | Status: DC

## 2016-11-02 NOTE — Telephone Encounter (Signed)
He is due for repeat lipids, please schedule a lab only appointment.

## 2016-11-02 NOTE — Telephone Encounter (Signed)
Received faxed refill request for rosuvastatin 40 mg. However I have check the medication history and do not see rosuvastatin 40 mg.  I called patient and verified his medication list. Patient would like to refill fenofibrate 160 mg and pravastatin 20 mg. Ok to refill? Last seen on 08/16/2016.

## 2016-11-03 NOTE — Telephone Encounter (Signed)
He said he will try to come in next week he said he will call to schedule. He said right now he's in [place].

## 2016-11-05 ENCOUNTER — Other Ambulatory Visit (INDEPENDENT_AMBULATORY_CARE_PROVIDER_SITE_OTHER): Payer: BLUE CROSS/BLUE SHIELD

## 2016-11-05 DIAGNOSIS — E785 Hyperlipidemia, unspecified: Secondary | ICD-10-CM | POA: Diagnosis not present

## 2016-11-05 DIAGNOSIS — E1129 Type 2 diabetes mellitus with other diabetic kidney complication: Secondary | ICD-10-CM

## 2016-11-05 DIAGNOSIS — R809 Proteinuria, unspecified: Secondary | ICD-10-CM | POA: Diagnosis not present

## 2016-11-05 LAB — LIPID PANEL
Cholesterol: 114 mg/dL (ref 0–200)
HDL: 21.1 mg/dL — ABNORMAL LOW (ref >39.00)
Total CHOL/HDL Ratio: 5
Triglycerides: 409 mg/dL — ABNORMAL HIGH (ref 0.0–149.0)

## 2016-11-05 LAB — LDL CHOLESTEROL, DIRECT: LDL DIRECT: 48 mg/dL

## 2016-11-05 LAB — HEMOGLOBIN A1C: Hgb A1c MFr Bld: 10.3 % — ABNORMAL HIGH (ref 4.6–6.5)

## 2016-11-08 ENCOUNTER — Other Ambulatory Visit: Payer: BLUE CROSS/BLUE SHIELD

## 2016-11-12 ENCOUNTER — Ambulatory Visit: Payer: BLUE CROSS/BLUE SHIELD | Admitting: Primary Care

## 2016-11-13 IMAGING — CR DG LUMBAR SPINE COMPLETE 4+V
5 series · 5 of 5 positions shown · non-contrast
Comparison: None.

CLINICAL DATA: Low back pain.  Initial evaluation .

EXAM:
LUMBAR SPINE - COMPLETE 4+ VIEW

[w lumbar spine ap]
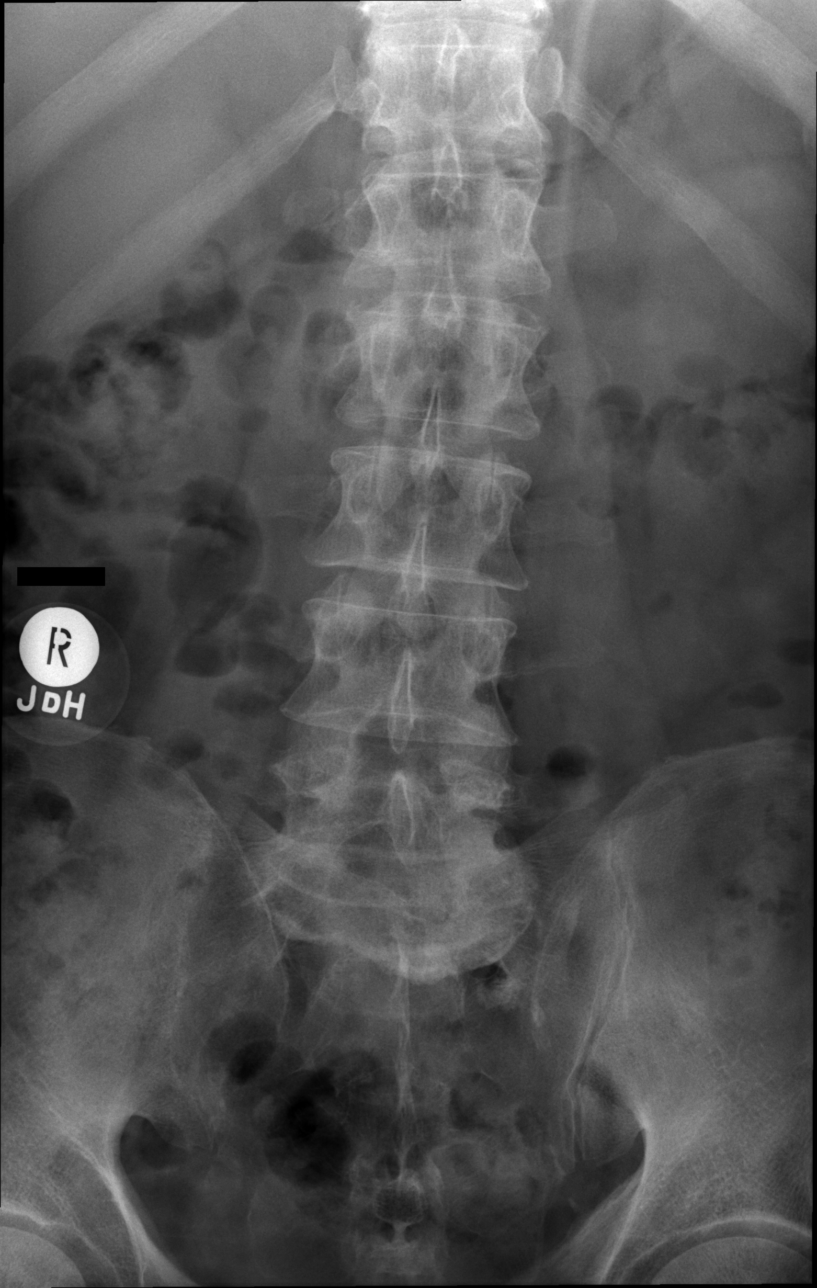

[w lumbar spine obl (1 of 2)]
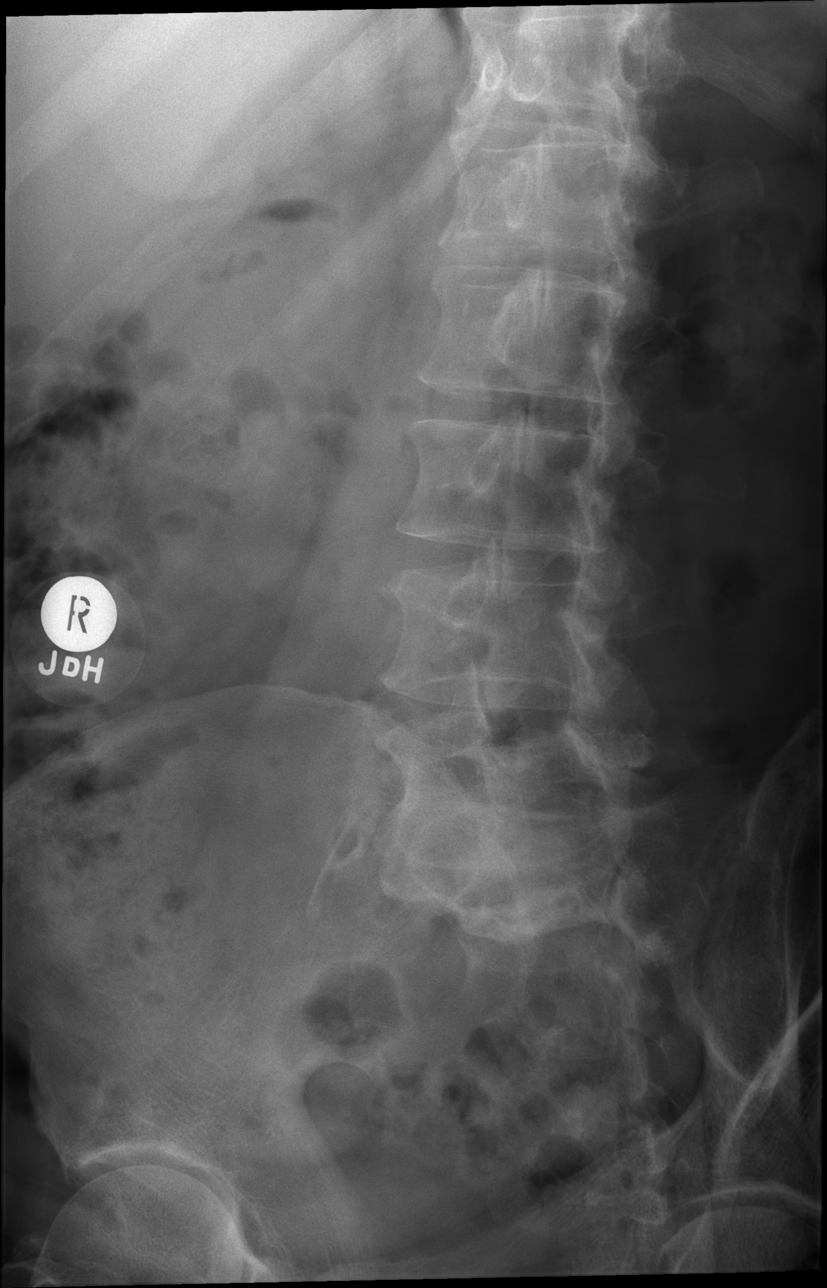

[w lumbar spine obl (2 of 2)]
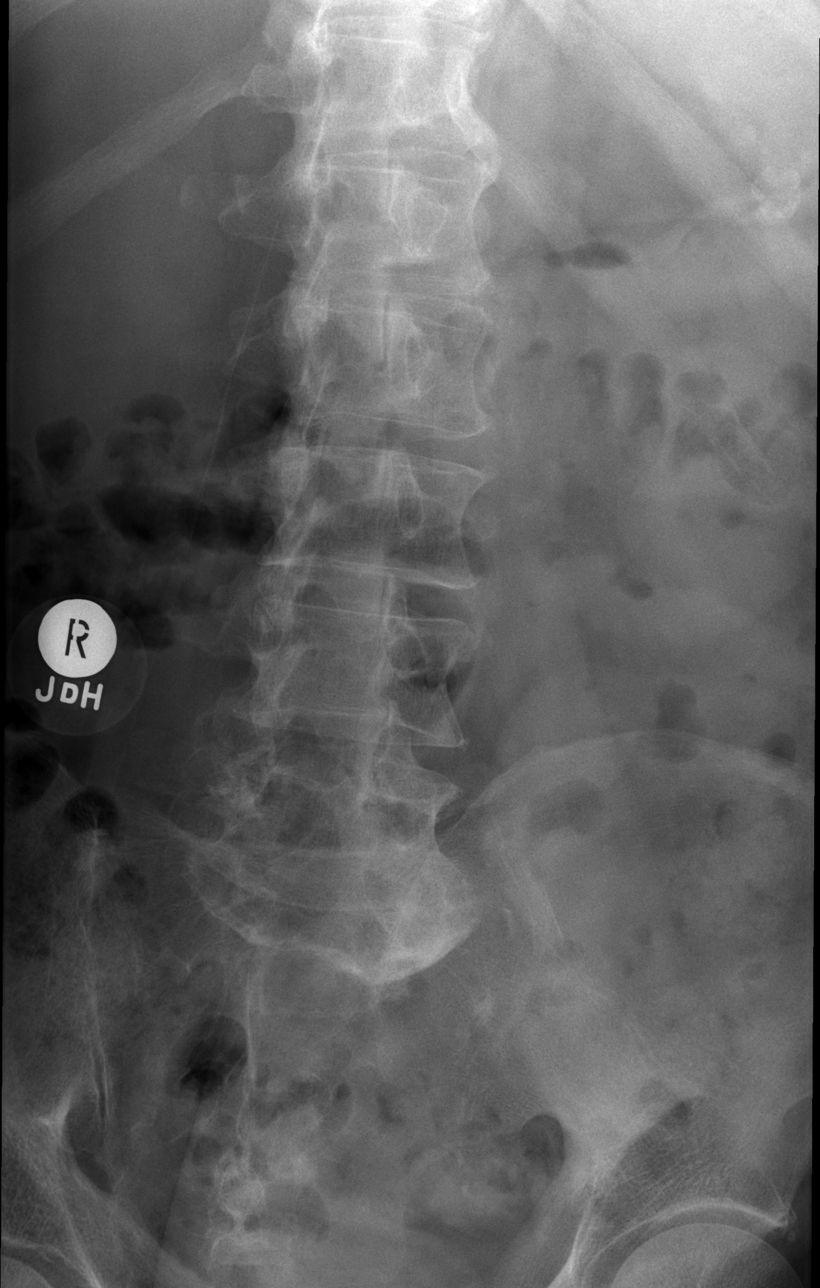

[w lumbar spine lat]
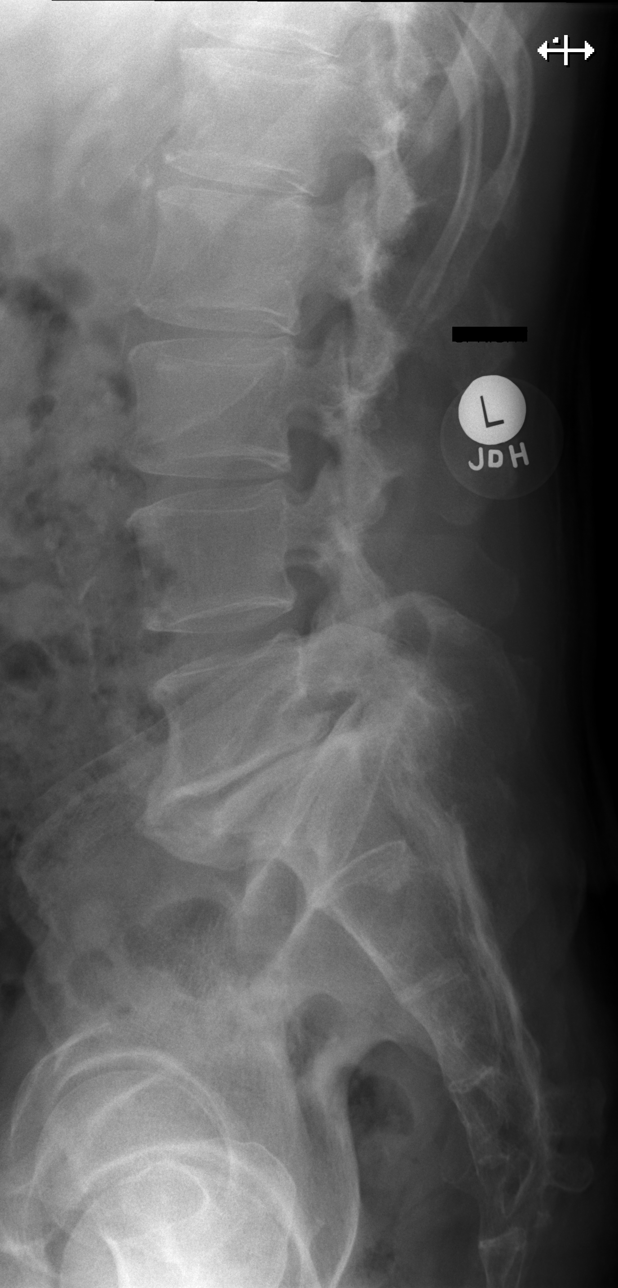

[w lumbar l-5 s-1 spot]
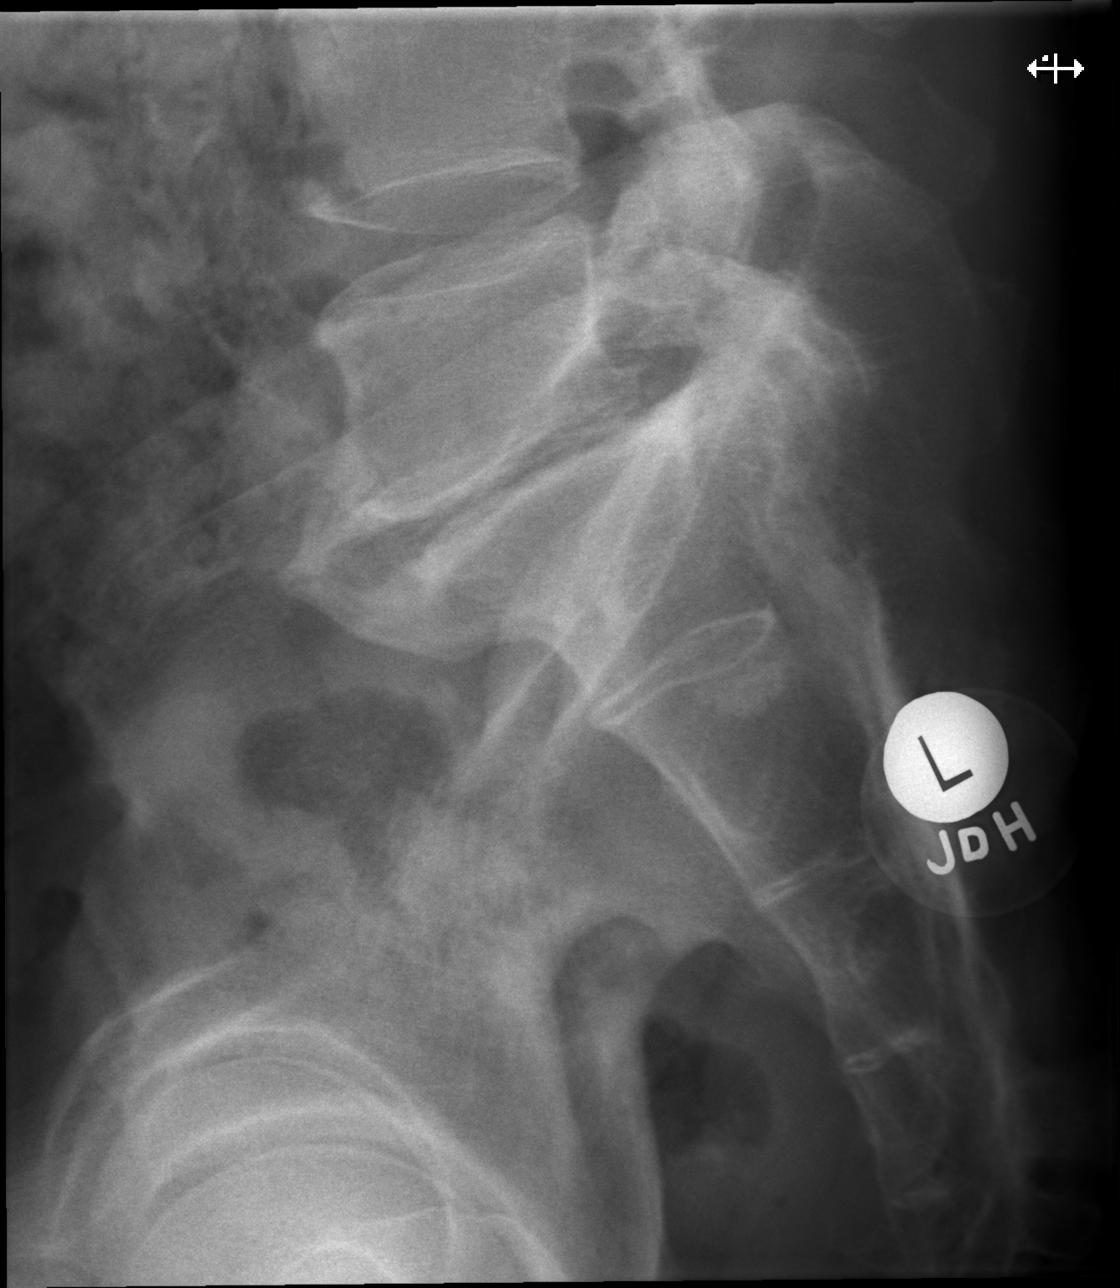

[5 of 5 positions shown; findings below may reference images not displayed]

FINDINGS: Paraspinal soft tissues are normal. Diffuse multilevel degenerative
change. No acute abnormality identified. Aortoiliac atherosclerotic
vascular disease.
IMPRESSION: 1. Diffuse multilevel degenerative change. 6 mm anterolisthesis L5
on S1. No acute abnormality identified.

2. Atherosclerotic vascular disease.

## 2016-11-15 ENCOUNTER — Encounter: Payer: Self-pay | Admitting: Primary Care

## 2016-11-15 ENCOUNTER — Ambulatory Visit (INDEPENDENT_AMBULATORY_CARE_PROVIDER_SITE_OTHER): Payer: BLUE CROSS/BLUE SHIELD | Admitting: Primary Care

## 2016-11-15 VITALS — BP 132/80 | HR 93 | Temp 98.3°F | Ht 71.0 in | Wt 215.1 lb

## 2016-11-15 DIAGNOSIS — E785 Hyperlipidemia, unspecified: Secondary | ICD-10-CM | POA: Diagnosis not present

## 2016-11-15 DIAGNOSIS — E119 Type 2 diabetes mellitus without complications: Secondary | ICD-10-CM | POA: Diagnosis not present

## 2016-11-15 DIAGNOSIS — I10 Essential (primary) hypertension: Secondary | ICD-10-CM

## 2016-11-15 MED ORDER — INSULIN GLARGINE 100 UNIT/ML SOLOSTAR PEN: 15.0000 [IU] | PEN_INJECTOR | Freq: Every day | SUBCUTANEOUS | 1 refills | Status: DC

## 2016-11-15 MED ORDER — INSULIN PEN NEEDLE 31G X 8 MM MISC: 3 refills | Status: DC

## 2016-11-15 NOTE — Progress Notes (Signed)
Subjective:    Patient ID: Christian Petty, male    DOB: 08-18-1955, 61 y.o.   MRN: [id number]  HPI  Christian Petty is a 61 year old male who presents today for follow up.  1) Type 2 Diabetes: Currently managed on Metformin 1000 mg BID and Glipizide 5 mg BID. Recent A1C of 10.3. Since his last visit he's not exercising and is eating a poor diet. 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 He's not checking his blood sugars regularly but when he's checked it his sugars fasting and at bedtime they run 250. He was previously managed on Victoza once weekly. He denies numbness/tingling.   Diet currently consists of:  Breakfast: Crackers, canned meat Lunch: Skips Dinner: Crackers, fast food (salads, subs) Snacks: Chips Desserts: None Beverages: Un-sweet tea, milk (1 gallon every 3 days), water   2) Hyperlipidemia: Currently managed on Pravastatin 20 mg and fenofibrate 160 mg once daily. Recent lipid panel with TC and LDL at goal, Trigs at 409. He's also doing 1000 mg twice daily.   3) Essential Hypertension: Currently managed on Losartan 50 mg. He denies chest pain, shortness of breath, dizziness.  Review of Systems  Eyes: Negative for visual disturbance.  Respiratory: Negative for shortness of breath.   Cardiovascular: Negative for chest pain.  Neurological: Negative for dizziness, light-headedness and headaches.       Past Medical History:  Diagnosis Date  . BPH (benign prostatic hyperplasia)   . Chest pain    (-) cardiolite  07-2006  . Diabetes mellitus 2008  . HTN (hypertension) 2007   BP normal as of 2013 w/o meds   . Hyperlipidemia, mild   . Neuropathy 09/2009     Social History   Social History  . Marital status: Married    Spouse name: N/A  . Number of children: 2  . Years of education: N/A   Occupational History  . truck driver     Social History Main Topics  . Smoking status: Former Smoker    Packs/day: 1.00    Types:  E-cigarettes  . Smokeless tobacco: Never Used     Comment: quit 2013, occasionally e cigarette  . Alcohol use Yes     Comment: socially   . Drug use: No  . Sexual activity: Not on file   Other Topics Concern  . Not on file   Social History Narrative   Married.   2 children, 2 grandchildren.   Works for himself as a Administrator.       Past Surgical History:  Procedure Laterality Date  . COLONOSCOPY    . INGUINAL HERNIA REPAIR    . LUMBAR LAMINECTOMY     x2    Family History  Problem Relation Age of Onset  . Diabetes Father   . Breast cancer Mother   . Stroke  4    F  . Hypertension Neg Hx   . Prostate cancer Neg Hx   . Colon cancer Neg Hx   . Coronary artery disease Neg Hx   . Esophageal cancer Neg Hx   . Rectal cancer Neg Hx   . Stomach cancer Neg Hx     No Known Allergies  Current Outpatient Prescriptions on File Prior to Visit  Medication Sig Dispense Refill  . fenofibrate 160 MG tablet TAKE 1 TABLET (160 MG TOTAL) BY MOUTH DAILY. 90 tablet 1  . glipiZIDE (GLUCOTROL) 5 MG tablet  TAKE 1 TABLET BY MOUTH TWICE A DAY BEFORE A MEAL. 180 tablet 0  . losartan (COZAAR) 50 MG tablet TAKE 1 TABLET (50 MG TOTAL) BY MOUTH DAILY. 90 tablet 1  . metFORMIN (GLUCOPHAGE) 1000 MG tablet Take 1 and 1/2 tablet twice daily. DUE FOR APPT WITH DR PAZ. [id number]. (Patient taking differently: Take 1 tablet and 1/2 daily in the morning. Take 1 tablet  daily at bedtime) 90 tablet 1  . pravastatin (PRAVACHOL) 20 MG tablet TAKE 1 TABLET (20 MG TOTAL) BY MOUTH DAILY. 90 tablet 1  . tamsulosin (FLOMAX) 0.4 MG CAPS capsule Take 1 capsule (0.4 mg total) by mouth daily after breakfast. 30 capsule 5   Current Facility-Administered Medications on File Prior to Visit  Medication Dose Route Frequency Provider Last Rate Last Dose  . 0.9 %  sodium chloride infusion  500 mL Intravenous Continuous Kavitha Nandigam V, MD        BP 132/80   Pulse 93   Temp 98.3 F (36.8 C) (Oral)   Ht 5\' 11"   (1.803 m)   Wt 215 lb 1.9 oz (97.6 kg)   SpO2 96%   BMI 30.00 kg/m    Objective:   Physical Exam  Constitutional: He appears well-nourished.  Neck: Neck supple.  Cardiovascular: Normal rate and regular rhythm.   Pulmonary/Chest: Effort normal and breath sounds normal.  Skin: Skin is warm and dry.          Assessment & Plan:

## 2016-11-15 NOTE — Progress Notes (Signed)
Pre visit review using our clinic review tool, if applicable. No additional management support is needed unless otherwise documented below in the visit note. 

## 2016-11-15 NOTE — Assessment & Plan Note (Signed)
Trigs above goal, likely due to poor diet and lack of exercise. Recommended low fat diet, start exercising. Continue statin and fenofibrate.

## 2016-11-15 NOTE — Assessment & Plan Note (Signed)
Stable in the office today, continue current regimen. 

## 2016-11-15 NOTE — Patient Instructions (Addendum)
Start Lanuts insulin for diabetes. Inject 15 units into the skin every night at bedtime.  Check your sugars fasting in the morning and just before bedtime. Write down these readings as I will call you in 2-3 weeks.   Continue the Metformin and Glipizide as prescribed.  Start exercising. You should be getting 150 minutes of moderate intensity exercise weekly.  Start eating fruit, vegetables. Choose healthier options if you have to get fast food. Reduce consumption of milk.   Please schedule a follow up visit in 3 months.  It was a pleasure to see you today!

## 2016-11-15 NOTE — Assessment & Plan Note (Signed)
Uncontrolled with A1C of 10.3. Will add in Lantus 15 units HS. Continue Metformin and Glipizide for now. Will have him monitor sugars, will call for readings in 2-3 weeks. Long discussion regarding diet and exercise. 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 Discussed healthy alternatives while on the road. Offered diabetic nutrition referral for which he declined. Continue to monitor. Follow up in 3 months.

## 2016-11-29 ENCOUNTER — Telehealth: Payer: Self-pay | Admitting: Primary Care

## 2016-11-29 DIAGNOSIS — E119 Type 2 diabetes mellitus without complications: Secondary | ICD-10-CM

## 2016-11-29 NOTE — Telephone Encounter (Signed)
-----   Message from Pleas Koch, NP sent at 11/15/2016 12:54 PM EDT ----- Regarding: Lantus Please check on blood sugar readings since we added Lantus 15 units at bedtime. What are his AM and PM readings.

## 2016-11-30 MED ORDER — LOSARTAN POTASSIUM 50 MG PO TABS: ORAL_TABLET | ORAL | 1 refills | Status: DC

## 2016-11-30 NOTE — Telephone Encounter (Signed)
Message left for patient to return my call.  

## 2016-12-01 NOTE — Telephone Encounter (Signed)
Patient brought by his reading and placed in Kate's inbox.

## 2016-12-07 NOTE — Telephone Encounter (Signed)
Please notify patient that I reviewed his blood sugar log and recommend we increase his Lantus to 22 units at bedtime. Please adjust this in his chart. We will call for readings in 3 weeks, have him continue to monitor and record readings.  Am sugars running: 171-320, mostly in mid 200's. Pm sugars running: 191-375, mostly in mid 200's.

## 2016-12-08 MED ORDER — INSULIN GLARGINE 100 UNIT/ML SOLOSTAR PEN
22.0000 [IU] | PEN_INJECTOR | Freq: Every day | SUBCUTANEOUS | 1 refills | Status: DC
Start: 2016-12-08 — End: 2017-01-12

## 2016-12-08 NOTE — Telephone Encounter (Signed)
Updated Lantus direction to 22 units at bedtime

## 2016-12-08 NOTE — Telephone Encounter (Signed)
Spoken and notified patient of Kate's comments. Patient verbalized understanding. 

## 2016-12-08 NOTE — Telephone Encounter (Signed)
Patient called to find out about his blood sugar readings.  Please call patient back at [phone].

## 2017-01-07 ENCOUNTER — Telehealth: Payer: Self-pay | Admitting: Primary Care

## 2017-01-07 NOTE — Telephone Encounter (Signed)
Patient has appointment on 01/12/2017

## 2017-01-07 NOTE — Telephone Encounter (Signed)
Please schedule an office visit so we can discuss. Have him bring his blood sugar logs. Thanks.

## 2017-01-07 NOTE — Telephone Encounter (Signed)
[place] Patient Name: Christian Petty DOB: [birth year] Initial Comment Caller states he is having problems with his sugar. At 8 pm last night he ate egg salad sandwich an sugar free cookie. This morning his sugar was 365. he wants advice on how to regulate sugar , he has been doing everything doctor has been advising him to do . Nurse Assessment Nurse: Dimas Chyle, RN, Dellis Filbert Date/Time Eilene Ghazi Time): 01/07/2017 10:22:17 AM Confirm and document reason for call. If symptomatic, describe symptoms. ---Caller states he is having problems with his sugar. At 8 pm last night he ate egg salad sandwich an sugar free cookie. This morning his sugar was 365. he wants advice on how to regulate sugar, he has been doing everything doctor has been advising him to do . Does the patient have any new or worsening symptoms? ---Yes Will a triage be completed? ---Yes Related visit to physician within the last 2 weeks? ---No Does the PT have any chronic conditions? (i.e. diabetes, asthma, etc.) ---Yes List chronic conditions. ---Diabetes type 2 Is this a behavioral health or substance abuse call? ---No Guidelines Guideline Title Affirmed Question Affirmed Notes Diabetes - High Blood Sugar [1] Blood glucose > 240 mg/dl (13 mmol/ l) AND [2] uses insulin (e.g., insulindependent, all people with type 1 diabetes) Final Disposition User [place], RN, Dellis Filbert Disagree/Comply: Comply

## 2017-01-12 ENCOUNTER — Ambulatory Visit (INDEPENDENT_AMBULATORY_CARE_PROVIDER_SITE_OTHER): Payer: BLUE CROSS/BLUE SHIELD | Admitting: Primary Care

## 2017-01-12 ENCOUNTER — Ambulatory Visit: Payer: BLUE CROSS/BLUE SHIELD | Admitting: Primary Care

## 2017-01-12 ENCOUNTER — Encounter: Payer: Self-pay | Admitting: Primary Care

## 2017-01-12 DIAGNOSIS — E119 Type 2 diabetes mellitus without complications: Secondary | ICD-10-CM

## 2017-01-12 MED ORDER — GLUCOSE BLOOD VI STRP: ORAL_STRIP | 1 refills | Status: DC

## 2017-01-12 MED ORDER — INSULIN GLARGINE 100 UNIT/ML SOLOSTAR PEN
25.0000 [IU] | PEN_INJECTOR | Freq: Every day | SUBCUTANEOUS | 1 refills | Status: DC
Start: 2017-01-12 — End: 2017-03-17

## 2017-01-12 MED ORDER — GLIPIZIDE 10 MG PO TABS: 10.0000 mg | ORAL_TABLET | Freq: Two times a day (BID) | ORAL | 3 refills | Status: DC

## 2017-01-12 NOTE — Patient Instructions (Addendum)
We've increased the dose of your glipizide from 5 mg twice daily to 10 mg twice daily. I sent a new prescription to your pharmacy.  Increase your Lanuts to 25 units at bedtime.  Continue to work on your diet and try to exercise.  Stop by the front desk and speak with either Rosaria Ferries or Shirlean Mylar regarding your referral to the diabetes nutritionist.   Schedule a follow up visit in 1 month with your sugar logs.   It was a pleasure to see you today!  Diabetes Mellitus and Food It is important for you to manage your blood sugar (glucose) level. Your blood glucose level can be greatly affected by what you eat. Eating healthier foods in the appropriate amounts throughout the day at about the same time each day will help you control your blood glucose level. It can also help slow or prevent worsening of your diabetes mellitus. Healthy eating may even help you improve the level of your blood pressure and reach or maintain a healthy weight. General recommendations for healthful eating and cooking habits include:  Eating meals and snacks regularly. Avoid going long periods of time without eating to lose weight.  Eating a diet that consists mainly of plant-based foods, such as fruits, vegetables, nuts, legumes, and whole grains.  Using low-heat cooking methods, such as baking, instead of high-heat cooking methods, such as deep frying.  Work with your dietitian to make sure you understand how to use the Nutrition Facts information on food labels. How can food affect me? Carbohydrates Carbohydrates affect your blood glucose level more than any other type of food. Your dietitian will help you determine how many carbohydrates to eat at each meal and teach you how to count carbohydrates. Counting carbohydrates is important to keep your blood glucose at a healthy level, especially if you are using insulin or taking certain medicines for diabetes mellitus. Alcohol Alcohol can cause sudden decreases in blood  glucose (hypoglycemia), especially if you use insulin or take certain medicines for diabetes mellitus. Hypoglycemia can be a life-threatening condition. Symptoms of hypoglycemia (sleepiness, dizziness, and disorientation) are similar to symptoms of having too much alcohol. If your health care provider has given you approval to drink alcohol, do so in moderation and use the following guidelines:  Women should not have more than one drink per day, and men should not have more than two drinks per day. One drink is equal to: ? 12 oz of beer. ? 5 oz of wine. ? 1 oz of hard liquor.  Do not drink on an empty stomach.  Keep yourself hydrated. Have water, diet soda, or unsweetened iced tea.  Regular soda, juice, and other mixers might contain a lot of carbohydrates and should be counted.  What foods are not recommended? As you make food choices, it is important to remember that all foods are not the same. Some foods have fewer nutrients per serving than other foods, even though they might have the same number of calories or carbohydrates. It is difficult to get your body what it needs when you eat foods with fewer nutrients. Examples of foods that you should avoid that are high in calories and carbohydrates but low in nutrients include:  Trans fats (most processed foods list trans fats on the Nutrition Facts label).  Regular soda.  Juice.  Candy.  Sweets, such as cake, pie, doughnuts, and cookies.  Fried foods.  What foods can I eat? Eat nutrient-rich foods, which will nourish your body and keep you  healthy. The food you should eat also will depend on several factors, including:  The calories you need.  The medicines you take.  Your weight.  Your blood glucose level.  Your blood pressure level.  Your cholesterol level.  You should eat a variety of foods, including:  Protein. ? Lean cuts of meat. ? Proteins low in saturated fats, such as fish, egg whites, and beans. Avoid  processed meats.  Fruits and vegetables. ? Fruits and vegetables that may help control blood glucose levels, such as apples, mangoes, and yams.  Dairy products. ? Choose fat-free or low-fat dairy products, such as milk, yogurt, and cheese.  Grains, bread, pasta, and rice. ? Choose whole grain products, such as multigrain bread, whole oats, and brown rice. These foods may help control blood pressure.  Fats. ? Foods containing healthful fats, such as nuts, avocado, olive oil, canola oil, and fish.  Does everyone with diabetes mellitus have the same meal plan? Because every person with diabetes mellitus is different, there is not one meal plan that works for everyone. It is very important that you meet with a dietitian who will help you create a meal plan that is just right for you. This information is not intended to replace advice given to you by your health care provider. Make sure you discuss any questions you have with your health care provider. Document Released: 04/01/2005 Document Revised: 12/11/2015 Document Reviewed: 06/01/2013 Elsevier Interactive Patient Education  2017 [employer].

## 2017-01-12 NOTE — Progress Notes (Signed)
Subjective:    Patient ID: Christian Petty, male    DOB: Sep 08, 1955, 61 y.o.   MRN: [id number]  HPI  Christian Petty is a 61 year old male who presents today for follow up of type 2 diabetes.   He is currently managed on glipizide 5 mg BID, metformin 1000 mg BID, Lantus 22 units at bedtime. He's checking his sugars fasting in the morning and in the afternoon. His fasting morning sugars are 180-200, his afternoon sugars are running 280. Over the last several days his fasting sugars were running in the 300's that lasted 3-4 day with return of his sugars to baseline several days ago.  He's been working on changes in his diet by reducing milk consumption.   Diet currently consists of:  Breakfast: Salad, sub sandwich Lunch: Skip Dinner: Crackers, canned meats, sub sandwich, meat with coleslaw, salad Snacks: Saltine Crackers Desserts: 5 times weekly little debbie cakes Beverages: Tea with Splenda, occasional diet Coke, 1/2 gallon of milk weekly  Exercise: No exercise.    Review of Systems  Constitutional: Negative for fatigue.  Respiratory: Negative for shortness of breath.   Cardiovascular: Negative for chest pain.  Neurological: Negative for dizziness, numbness and headaches.       Past Medical History:  Diagnosis Date  . BPH (benign prostatic hyperplasia)   . Chest pain    (-) cardiolite  07-2006  . Diabetes mellitus 2008  . HTN (hypertension) 2007   BP normal as of 2013 w/o meds   . Hyperlipidemia, mild   . Neuropathy 09/2009     Social History   Social History  . Marital status: Married    Spouse name: N/A  . Number of children: 2  . Years of education: N/A   Occupational History  . truck driver     Social History Main Topics  . Smoking status: Former Smoker    Packs/day: 1.00    Types: E-cigarettes  . Smokeless tobacco: Never Used     Comment: quit 2013, occasionally e cigarette  . Alcohol use Yes     Comment: socially   . Drug use: No  . Sexual activity: Not on  file   Other Topics Concern  . Not on file   Social History Narrative   Married.   2 children, 2 grandchildren.   Works for himself as a Administrator.       Past Surgical History:  Procedure Laterality Date  . COLONOSCOPY    . INGUINAL HERNIA REPAIR    . LUMBAR LAMINECTOMY     x2    Family History  Problem Relation Age of Onset  . Diabetes Father   . Breast cancer Mother   . Stroke Unknown 83       F  . Hypertension Neg Hx   . Prostate cancer Neg Hx   . Colon cancer Neg Hx   . Coronary artery disease Neg Hx   . Esophageal cancer Neg Hx   . Rectal cancer Neg Hx   . Stomach cancer Neg Hx     No Known Allergies  Current Outpatient Prescriptions on File Prior to Visit  Medication Sig Dispense Refill  . fenofibrate 160 MG tablet TAKE 1 TABLET (160 MG TOTAL) BY MOUTH DAILY. 90 tablet 1  . Insulin Pen Needle (CAREFINE PEN NEEDLES) 31G X 8 MM MISC Use with insulin once nightly at bedtime. 90 each 3  . losartan (COZAAR) 50 MG tablet TAKE 1 TABLET (50 MG TOTAL) BY MOUTH DAILY.  90 tablet 1  . metFORMIN (GLUCOPHAGE) 1000 MG tablet Take 1 and 1/2 tablet twice daily. DUE FOR APPT WITH DR PAZ. [id number]. (Patient taking differently: Take 1 tablet and 1/2 daily in the morning. Take 1 tablet  daily at bedtime) 90 tablet 1  . pravastatin (PRAVACHOL) 20 MG tablet TAKE 1 TABLET (20 MG TOTAL) BY MOUTH DAILY. 90 tablet 1  . tamsulosin (FLOMAX) 0.4 MG CAPS capsule Take 1 capsule (0.4 mg total) by mouth daily after breakfast. 30 capsule 5   Current Facility-Administered Medications on File Prior to Visit  Medication Dose Route Frequency Provider Last Rate Last Dose  . 0.9 %  sodium chloride infusion  500 mL Intravenous Continuous Nandigam, Kavitha V, MD        BP 136/84   Pulse 100   Temp 98.6 F (37 C) (Oral)   Ht 5\' 11"  (1.803 m)   Wt 215 lb 12.8 oz (97.9 kg)   SpO2 96%   BMI 30.10 kg/m    Objective:   Physical Exam  Constitutional: He appears well-nourished.  Neck: Neck  supple.  Cardiovascular: Normal rate and regular rhythm.   Pulmonary/Chest: Effort normal and breath sounds normal.  Skin: Skin is warm and dry.          Assessment & Plan:

## 2017-01-12 NOTE — Assessment & Plan Note (Signed)
Glucose overall improved, not yet at goal. Increase Lantus to 25 units once nightly, increase Glipizide to 10 mg BID. Referral placed for diabetic nutritionist. Recommended regular exercise. Follow up in 1 month for A1C with glucose logs.

## 2017-01-17 ENCOUNTER — Ambulatory Visit: Payer: BLUE CROSS/BLUE SHIELD | Admitting: Dietician

## 2017-01-18 ENCOUNTER — Encounter: Payer: Self-pay | Admitting: Dietician

## 2017-01-18 ENCOUNTER — Encounter: Payer: BLUE CROSS/BLUE SHIELD | Attending: Primary Care | Admitting: Dietician

## 2017-01-18 VITALS — Ht 72.0 in | Wt 215.0 lb

## 2017-01-18 DIAGNOSIS — E119 Type 2 diabetes mellitus without complications: Secondary | ICD-10-CM

## 2017-01-18 DIAGNOSIS — Z794 Long term (current) use of insulin: Secondary | ICD-10-CM | POA: Diagnosis not present

## 2017-01-18 NOTE — Patient Instructions (Signed)
Work to establish a more consistent pattern of eating with 3 meals per day spaced 4-6 hours apart. Keep fruits and raw vegetables in the truck refrigerator to add to 1/2 of 12" sub. Save the other part of sandwich for a meal later. Can include a snack that includes 15-20 gms of carbohydrate and a protein food (nuts, peanut butter, cheese, or meat) Ex. 2 oatmeal cookies (20 gms) + nuts or peanut butter rather than more cookies. 2 sheets of graham crackers + peanut butter. Cheese and crackers Chicken salad and crackers  Choose leaner meats for sandwiches such as [place], lean ham or roast beef to help decrease intake of higher fat meats such as bologna and spam. Balance a sandwich meal with fruit and raw vegetables. Add raw vegetables to meals and snacks. 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

## 2017-01-18 NOTE — Progress Notes (Signed)
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 Preferred learning method:  . No preference indicated  Current weight: 215 lbs  Height: 6' Medications, supplements: see list Progress and evaluation:  Patient in for medical nutrition therapy initial appointment. He reports that he has no consistent pattern of eating. 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 He keeps packages of crackers (nabs) in his truck and just "grabs a pack" throughout the day. He states he has long stretches between meals depending on route; he has refrigeration in the truck and carries foods with him but doesn't like to stop to eat making it easier to over eat when he does eat. He states that when he does stop, [employer] is a frequent choice. 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 He has made some positive diet changes such as avoiding fries when stopping at fast foods. He has also decreased his milk intake from 1 gallon in 3 days to 1/2 gallon/3 days.  He states he was motivated to make changes when his HgA1c was greater than 10 in April. He reports his fasting blood sugars have improved. Avoids all sugar sweetened beverages.  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 Made suggestions of practical foods he can keep in truck but encouraged to work toward a pattern of 3 meals spaced 4-6 hours apart even if he's eating in the truck. Also, gave and reviewed snack options that include carbohydrate and protein.   Nutritional Diagnosis:  NI-5.8.4 Inconsistent  carbohydrate intake As related to long stretches between meals.  As evidenced by diet history..  Intervention:  Work to establish a more consistent pattern of eating with 3 meals per day spaced 4-6 hours apart. Keep fruits and raw vegetables in the truck refrigerator to add to 1/2 of 12" sub. Save the other part of sandwich for a meal later. Can include a snack that includes 15-20 gms of carbohydrate and a protein food (nuts, peanut butter, cheese, or meat) Ex. 2 oatmeal cookies (20 gms) + nuts or peanut butter rather than more cookies. 2 sheets of graham crackers + peanut butter. Cheese and crackers Chicken salad and crackers  Choose leaner meats for sandwiches such as [place], lean ham or roast beef to help decrease intake of higher fat meats such as bologna and spam. Balance a sandwich meal with fruit and raw vegetables. Add raw vegetables to meals and snacks. 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  Education Materials given:  . Plate Planner . Food lists/ Planning A Balanced Meal . Sample meal pattern/ menus . Snacking handout . Goals/ instructions  Learner/ who was taught:  . Patient  Level of understanding: . Partial understanding; needs review/ practice .  Demonstrated degree of understanding via:   Teach back Learning barriers: . None Willingness to learn/ readiness for change: . Change in Progress  Monitoring and Evaluation:  Dietary intake, exercise, , and body weight      follow up: none scheduled; patient was encouraged to call if desires further help with his diet/nutrition.

## 2017-02-04 ENCOUNTER — Other Ambulatory Visit: Payer: Self-pay | Admitting: Primary Care

## 2017-02-04 DIAGNOSIS — E118 Type 2 diabetes mellitus with unspecified complications: Secondary | ICD-10-CM

## 2017-02-04 DIAGNOSIS — Z794 Long term (current) use of insulin: Secondary | ICD-10-CM

## 2017-02-04 NOTE — Telephone Encounter (Signed)
Ok to refill? Electronically refill request for metFORMIN (GLUCOPHAGE) 1000 MG tablet.  Medication have not been prescribed by [facility]. Last seen on 01/12/2017

## 2017-02-04 NOTE — Telephone Encounter (Signed)
Please notify patient that I've changed his Metformin to 1 tablet twice daily to make the prescription easier and also since we've increased his insulin.

## 2017-02-07 NOTE — Telephone Encounter (Signed)
Spoken and notified patient of Kate's comments. Patient verbalized understanding. 

## 2017-03-17 ENCOUNTER — Other Ambulatory Visit: Payer: Self-pay | Admitting: Primary Care

## 2017-03-17 DIAGNOSIS — E119 Type 2 diabetes mellitus without complications: Secondary | ICD-10-CM

## 2017-03-31 ENCOUNTER — Encounter: Payer: Self-pay | Admitting: Primary Care

## 2017-03-31 ENCOUNTER — Ambulatory Visit (INDEPENDENT_AMBULATORY_CARE_PROVIDER_SITE_OTHER): Payer: BLUE CROSS/BLUE SHIELD | Admitting: Primary Care

## 2017-03-31 DIAGNOSIS — E119 Type 2 diabetes mellitus without complications: Secondary | ICD-10-CM

## 2017-03-31 LAB — HEMOGLOBIN A1C: Hgb A1c MFr Bld: 9.2 % — ABNORMAL HIGH (ref 4.6–6.5)

## 2017-03-31 MED ORDER — GLUCOSE BLOOD VI STRP
ORAL_STRIP | 5 refills | Status: DC
Start: 2017-03-31 — End: 2018-09-08

## 2017-03-31 MED ORDER — ONETOUCH ULTRA 2 W/DEVICE KIT: PACK | 0 refills | Status: DC

## 2017-03-31 MED ORDER — ONETOUCH ULTRASOFT LANCETS MISC: 5 refills | Status: DC

## 2017-03-31 MED ORDER — BLOOD GLUCOSE METER KIT: PACK | 0 refills | Status: DC

## 2017-03-31 NOTE — Addendum Note (Signed)
Addended by: Jacqualin Combes on: 03/31/2017 02:08 PM   Modules accepted: Orders

## 2017-03-31 NOTE — Progress Notes (Signed)
Subjective:    Patient ID: Christian Petty, male    DOB: 05/19/1956, 61 y.o.   MRN: [id number]  HPI  Mr. Christian Petty is a 61 year old male who presents today for follow up diabetes.  Current medications include: Glipizide 10 mg BID, metformin 1000 mg BID, Lantus 25 units HS. His test strips are too expensive, not testing very often given this reason.  He/She is checking his/her blood glucose ever other day and is getting readings of: Fasting AM: 160's Close to 2 hours eating dinner: 180's.  Highest number was once 240, lowest number was 107.   Last A1C: 10.3 in April 2018 Last Eye Exam: Completed in 2018 Last Foot Exam: Completed in 2018 Pneumonia Vaccination: Completed in 2016 ACE/ARB: Losartan Statin: Pravastatin   Diet currently consists of:  Breakfast: Crackers, sub sandwich Lunch: sub sandwich, home made sandwiches Dinner: Crackers, canned meat, sometimes home cooked meals Snacks: Cookies, occasionally Desserts: Occasionally  Beverages: Tea with Splenda, water, some milk  Exercise: He is currently not exercising.       Review of Systems  Constitutional: Negative for fatigue.  Respiratory: Negative for shortness of breath.   Cardiovascular: Negative for chest pain.  Neurological: Negative for dizziness, numbness and headaches.       Past Medical History:  Diagnosis Date  . BPH (benign prostatic hyperplasia)   . Chest pain    (-) cardiolite  07-2006  . Diabetes mellitus 2008  . HTN (hypertension) 2007   BP normal as of 2013 w/o meds   . Hyperlipidemia, mild   . Neuropathy 09/2009     Social History   Social History  . Marital status: Married    Spouse name: N/A  . Number of children: 2  . Years of education: N/A   Occupational History  . truck driver     Social History Main Topics  . Smoking status: Former Smoker    Packs/day: 1.00    Types: E-cigarettes  . Smokeless tobacco: Never Used     Comment: quit 2013, occasionally e cigarette  . Alcohol use  Yes     Comment: socially   . Drug use: No  . Sexual activity: Not on file   Other Topics Concern  . Not on file   Social History Narrative   Married.   2 children, 2 grandchildren.   Works for himself as a Administrator.       Past Surgical History:  Procedure Laterality Date  . COLONOSCOPY    . INGUINAL HERNIA REPAIR    . LUMBAR LAMINECTOMY     x2    Family History  Problem Relation Age of Onset  . Diabetes Father   . Breast cancer Mother   . Stroke Unknown 16       F  . Hypertension Neg Hx   . Prostate cancer Neg Hx   . Colon cancer Neg Hx   . Coronary artery disease Neg Hx   . Esophageal cancer Neg Hx   . Rectal cancer Neg Hx   . Stomach cancer Neg Hx     No Known Allergies  Current Outpatient Prescriptions on File Prior to Visit  Medication Sig Dispense Refill  . fenofibrate 160 MG tablet TAKE 1 TABLET (160 MG TOTAL) BY MOUTH DAILY. 90 tablet 1  . glipiZIDE (GLUCOTROL) 10 MG tablet Take 1 tablet (10 mg total) by mouth 2 (two) times daily before a meal. 180 tablet 3  . glucose blood (FREESTYLE TEST STRIPS) test strip  Use as instructed to test blood sugar 3 times daily 300 each 1  . Insulin Glargine (LANTUS SOLOSTAR) 100 UNIT/ML Solostar Pen Inject 25 Units into the skin at bedtime. 15 mL 1  . Insulin Pen Needle (CAREFINE PEN NEEDLES) 31G X 8 MM MISC Use with insulin once nightly at bedtime. 90 each 3  . losartan (COZAAR) 50 MG tablet TAKE 1 TABLET (50 MG TOTAL) BY MOUTH DAILY. 90 tablet 1  . metFORMIN (GLUCOPHAGE) 1000 MG tablet Take 1 tablet (1,000 mg total) by mouth 2 (two) times daily with a meal. 180 tablet 3  . pravastatin (PRAVACHOL) 20 MG tablet TAKE 1 TABLET (20 MG TOTAL) BY MOUTH DAILY. 90 tablet 1  . tamsulosin (FLOMAX) 0.4 MG CAPS capsule Take 1 capsule (0.4 mg total) by mouth daily after breakfast. 30 capsule 5   Current Facility-Administered Medications on File Prior to Visit  Medication Dose Route Frequency Provider Last Rate Last Dose  . 0.9 %   sodium chloride infusion  500 mL Intravenous Continuous Nandigam, Kavitha V, MD        BP 126/84   Pulse 78   Temp 98.3 F (36.8 C) (Oral)   Ht 5\' 11"  (1.803 m)   Wt 215 lb 1.9 oz (97.6 kg)   SpO2 98%   BMI 30.00 kg/m    Objective:   Physical Exam  Constitutional: He appears well-nourished.  Neck: Neck supple.  Cardiovascular: Normal rate and regular rhythm.   Pulmonary/Chest: Effort normal and breath sounds normal. He has no wheezes. He has no rales.  Skin: Skin is warm and dry.          Assessment & Plan:

## 2017-03-31 NOTE — Assessment & Plan Note (Addendum)
Glucose levels seem improved, he is gradually working to improve diet. Recommended regular exercise. Continue lantus 25 units HS and Glipizide 10 mg BID for now. A1C due today. Managed on ARB and statin. Pneumonia vaccination and foot exam UTD. Will send Rx for new meter and test strips given cost of current meter and strips. Follow up in 6 months.

## 2017-03-31 NOTE — Patient Instructions (Signed)
Complete lab work prior to leaving today. I will notify you of your results once received.   Continue to work on improving your diet. Increase vegetables, fruit, whole grains.  Ensure you are consuming 64 ounces of water daily.  Start exercising. You should be getting 150 minutes of moderate intensity exercise weekly.  Follow up in 6 months for re-evaluation.  It was a pleasure to see you today!  Diabetes Mellitus and Food It is important for you to manage your blood sugar (glucose) level. Your blood glucose level can be greatly affected by what you eat. Eating healthier foods in the appropriate amounts throughout the day at about the same time each day will help you control your blood glucose level. It can also help slow or prevent worsening of your diabetes mellitus. Healthy eating may even help you improve the level of your blood pressure and reach or maintain a healthy weight. General recommendations for healthful eating and cooking habits include:  Eating meals and snacks regularly. Avoid going long periods of time without eating to lose weight.  Eating a diet that consists mainly of plant-based foods, such as fruits, vegetables, nuts, legumes, and whole grains.  Using low-heat cooking methods, such as baking, instead of high-heat cooking methods, such as deep frying.  Work with your dietitian to make sure you understand how to use the Nutrition Facts information on food labels. How can food affect me? Carbohydrates Carbohydrates affect your blood glucose level more than any other type of food. Your dietitian will help you determine how many carbohydrates to eat at each meal and teach you how to count carbohydrates. Counting carbohydrates is important to keep your blood glucose at a healthy level, especially if you are using insulin or taking certain medicines for diabetes mellitus. Alcohol Alcohol can cause sudden decreases in blood glucose (hypoglycemia), especially if you use  insulin or take certain medicines for diabetes mellitus. Hypoglycemia can be a life-threatening condition. Symptoms of hypoglycemia (sleepiness, dizziness, and disorientation) are similar to symptoms of having too much alcohol. If your health care provider has given you approval to drink alcohol, do so in moderation and use the following guidelines:  Women should not have more than one drink per day, and men should not have more than two drinks per day. One drink is equal to: ? 12 oz of beer. ? 5 oz of wine. ? 1 oz of hard liquor.  Do not drink on an empty stomach.  Keep yourself hydrated. Have water, diet soda, or unsweetened iced tea.  Regular soda, juice, and other mixers might contain a lot of carbohydrates and should be counted.  What foods are not recommended? As you make food choices, it is important to remember that all foods are not the same. Some foods have fewer nutrients per serving than other foods, even though they might have the same number of calories or carbohydrates. It is difficult to get your body what it needs when you eat foods with fewer nutrients. Examples of foods that you should avoid that are high in calories and carbohydrates but low in nutrients include:  Trans fats (most processed foods list trans fats on the Nutrition Facts label).  Regular soda.  Juice.  Candy.  Sweets, such as cake, pie, doughnuts, and cookies.  Fried foods.  What foods can I eat? Eat nutrient-rich foods, which will nourish your body and keep you healthy. The food you should eat also will depend on several factors, including:  The calories you need.  The medicines you take.  Your weight.  Your blood glucose level.  Your blood pressure level.  Your cholesterol level.  You should eat a variety of foods, including:  Protein. ? Lean cuts of meat. ? Proteins low in saturated fats, such as fish, egg whites, and beans. Avoid processed meats.  Fruits and  vegetables. ? Fruits and vegetables that may help control blood glucose levels, such as apples, mangoes, and yams.  Dairy products. ? Choose fat-free or low-fat dairy products, such as milk, yogurt, and cheese.  Grains, bread, pasta, and rice. ? Choose whole grain products, such as multigrain bread, whole oats, and brown rice. These foods may help control blood pressure.  Fats. ? Foods containing healthful fats, such as nuts, avocado, olive oil, canola oil, and fish.  Does everyone with diabetes mellitus have the same meal plan? Because every person with diabetes mellitus is different, there is not one meal plan that works for everyone. It is very important that you meet with a dietitian who will help you create a meal plan that is just right for you. This information is not intended to replace advice given to you by your health care provider. Make sure you discuss any questions you have with your health care provider. Document Released: 04/01/2005 Document Revised: 12/11/2015 Document Reviewed: 06/01/2013 Elsevier Interactive Patient Education  2017 [employer].

## 2017-04-15 ENCOUNTER — Other Ambulatory Visit: Payer: Self-pay | Admitting: Primary Care

## 2017-04-28 ENCOUNTER — Telehealth: Payer: Self-pay | Admitting: Primary Care

## 2017-04-28 NOTE — Telephone Encounter (Signed)
-----   Message from Pleas Koch, NP sent at 03/31/2017  1:33 PM EDT ----- Regarding: Blood Sugars How are his blood sugars running since we increased his Lantus to 30 units?

## 2017-04-28 NOTE — Telephone Encounter (Signed)
Message left for patient to return my call.  

## 2017-05-02 ENCOUNTER — Other Ambulatory Visit: Payer: Self-pay | Admitting: Primary Care

## 2017-05-02 DIAGNOSIS — E782 Mixed hyperlipidemia: Secondary | ICD-10-CM

## 2017-05-06 NOTE — Telephone Encounter (Signed)
Spoken to patient. He was on the road so could not talk much and does not have readings in front of him. He stated that his blood sugar runs around 160-150. He is feeling okay.

## 2017-05-06 NOTE — Telephone Encounter (Signed)
Noted, would like to see him back in the office in mid to late December. Please schedule.

## 2017-05-10 NOTE — Telephone Encounter (Signed)
Spoken to patient and he will call back later. 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  Will have send letter for patient to schedule appt

## 2017-05-27 ENCOUNTER — Other Ambulatory Visit: Payer: Self-pay | Admitting: Primary Care

## 2017-06-09 ENCOUNTER — Other Ambulatory Visit: Payer: Self-pay | Admitting: Primary Care

## 2017-06-09 DIAGNOSIS — E119 Type 2 diabetes mellitus without complications: Secondary | ICD-10-CM

## 2017-06-30 ENCOUNTER — Telehealth: Payer: Self-pay | Admitting: Primary Care

## 2017-06-30 NOTE — Telephone Encounter (Signed)
Received faxed request regarding Lantus Solostart 100 unit/mL pen  Alternative request due to high copay for the medication and asking to send alternate  Last prescribed on 06/13/2017 for 15 mL pack of pens

## 2017-06-30 NOTE — Telephone Encounter (Signed)
Send in Levemir, same dosage, same quantity and refills

## 2017-07-25 ENCOUNTER — Ambulatory Visit: Payer: BLUE CROSS/BLUE SHIELD | Admitting: Primary Care

## 2017-07-26 ENCOUNTER — Telehealth: Payer: Self-pay

## 2017-07-26 ENCOUNTER — Encounter: Payer: Self-pay | Admitting: Primary Care

## 2017-07-26 ENCOUNTER — Ambulatory Visit (INDEPENDENT_AMBULATORY_CARE_PROVIDER_SITE_OTHER): Payer: Self-pay | Admitting: Primary Care

## 2017-07-26 VITALS — BP 118/64 | HR 97 | Temp 98.1°F | Wt 206.0 lb

## 2017-07-26 DIAGNOSIS — J069 Acute upper respiratory infection, unspecified: Secondary | ICD-10-CM

## 2017-07-26 MED ORDER — GUAIFENESIN-CODEINE 100-10 MG/5ML PO SYRP: 5.0000 mL | ORAL_SOLUTION | Freq: Every evening | ORAL | 0 refills | Status: DC | PRN

## 2017-07-26 MED ORDER — AZITHROMYCIN 250 MG PO TABS: ORAL_TABLET | ORAL | 0 refills | Status: DC

## 2017-07-26 NOTE — Telephone Encounter (Signed)
Pam from [facility] said pt is there to pick up abx. Zithromax was sent to [facility]. Pam will transfer rx to [facility]. Nothing further needed.

## 2017-07-26 NOTE — Patient Instructions (Signed)
Start Azithromycin antibiotics for infection. Take 2 tablets by mouth today, then 1 tablet daily for 4 additional days.  You may take the cough suppressant at bedtime as needed for cough and rest. Caution this medication contains codeine and will make you feel drowsy.  Ensure you are staying hydrated and rest.   It was a pleasure to see you today!

## 2017-07-26 NOTE — Progress Notes (Signed)
Subjective:    Patient ID: Christian Petty, male    DOB: [birth year], 62 y.o.   MRN: [id number]  HPI  Christian Petty is a 62 year old male with a history of essential hypertension managed on losartan, type 2 diabetes, tobacco abuse who presents today with a chief complaint of cough. He also reports chills, fatigue, nausea. His cough began one week ago and is much worse at night. He's been taking Robitussin with temporary improvement. He also took some cough medication with codeine from and old prescription with better improvement.   Review of Systems  Constitutional: Positive for chills and fatigue.  HENT: Positive for congestion, sinus pressure and sore throat.   Respiratory: Positive for cough. Negative for shortness of breath.   Cardiovascular: Negative for chest pain.       Past Medical History:  Diagnosis Date  . BPH (benign prostatic hyperplasia)   . Chest pain    (-) cardiolite  07-2006  . Diabetes mellitus 2008  . HTN (hypertension) 2007   BP normal as of 2013 w/o meds   . Hyperlipidemia, mild   . Neuropathy 09/2009     Social History   Socioeconomic History  . Marital status: Married    Spouse name: Not on file  . Number of children: 2  . Years of education: Not on file  . Highest education level: Not on file  Social Needs  . Financial resource strain: Not on file  . Food insecurity - worry: Not on file  . Food insecurity - inability: Not on file  . Transportation needs - medical: Not on file  . Transportation needs - non-medical: Not on file  Occupational History  . Occupation: truck Geophysicist/field seismologist   Tobacco Use  . Smoking status: Former Smoker    Packs/day: 1.00    Types: E-cigarettes  . Smokeless tobacco: Never Used  . Tobacco comment: quit 2013, occasionally e cigarette  Substance and Sexual Activity  . Alcohol use: Yes    Comment: socially   . Drug use: No  . Sexual activity: Not on file  Other Topics Concern  . Not on file  Social History Narrative   Married.   2  children, 2 grandchildren.   Works for himself as a Administrator.    Past Surgical History:  Procedure Laterality Date  . COLONOSCOPY    . INGUINAL HERNIA REPAIR    . LUMBAR LAMINECTOMY     x2    Family History  Problem Relation Age of Onset  . Diabetes Father   . Breast cancer Mother   . Stroke Unknown 1       F  . Hypertension Neg Hx   . Prostate cancer Neg Hx   . Colon cancer Neg Hx   . Coronary artery disease Neg Hx   . Esophageal cancer Neg Hx   . Rectal cancer Neg Hx   . Stomach cancer Neg Hx     No Known Allergies  Current Outpatient Medications on File Prior to Visit  Medication Sig Dispense Refill  . blood glucose meter kit and supplies Dispense based on patient and insurance preference. Use up to 2 times daily as directed. 1 each 0  . Blood Glucose Monitoring Suppl (ONE TOUCH ULTRA 2) w/Device KIT Use to test blood sugar 2 times daily 1 each 0  . fenofibrate 160 MG tablet TAKE 1 TABLET (160 MG TOTAL) BY MOUTH DAILY. 90 tablet 1  . glipiZIDE (GLUCOTROL) 10 MG tablet Take 1 tablet (  10 mg total) by mouth 2 (two) times daily before a meal. 180 tablet 3  . glucose blood (ONE TOUCH ULTRA TEST) test strip Use to test blood sugar 2 times daily 100 each 5  . Insulin Glargine (LANTUS SOLOSTAR) 100 UNIT/ML Solostar Pen Inject 30 Units into the skin at bedtime. 15 mL 0  . Insulin Pen Needle (CAREFINE PEN NEEDLES) 31G X 8 MM MISC Use with insulin once nightly at bedtime. 90 each 3  . Lancets (ONETOUCH ULTRASOFT) lancets Use to test blood sugar 2 times daily 100 each 5  . losartan (COZAAR) 50 MG tablet TAKE 1 TABLET BY MOUTH DAILY 90 tablet 1  . metFORMIN (GLUCOPHAGE) 1000 MG tablet Take 1 tablet (1,000 mg total) by mouth 2 (two) times daily with a meal. 180 tablet 3  . pravastatin (PRAVACHOL) 20 MG tablet TAKE 1 TABLET BY MOUTH DAILY 90 tablet 1  . tamsulosin (FLOMAX) 0.4 MG CAPS capsule TAKE ONE CAPSULE BY MOUTH DAILY AFTER BREAKFAST 30 capsule 5   Current  Facility-Administered Medications on File Prior to Visit  Medication Dose Route Frequency Provider Last Rate Last Dose  . 0.9 %  sodium chloride infusion  500 mL Intravenous Continuous Nandigam, Kavitha V, MD        BP 118/64   Pulse 97   Temp 98.1 F (36.7 C) (Oral)   Wt 206 lb (93.4 kg)   SpO2 98%   BMI 28.73 kg/m    Objective:   Physical Exam  Constitutional: He appears well-nourished. He appears ill.  HENT:  Right Ear: Tympanic membrane and ear canal normal.  Left Ear: Tympanic membrane and ear canal normal.  Nose: Mucosal edema present. Right sinus exhibits maxillary sinus tenderness. Right sinus exhibits no frontal sinus tenderness. Left sinus exhibits maxillary sinus tenderness. Left sinus exhibits no frontal sinus tenderness.  Mouth/Throat: Oropharynx is clear and moist.  Eyes: Conjunctivae are normal.  Neck: Neck supple.  Cardiovascular: Normal rate and regular rhythm.  Pulmonary/Chest: Effort normal. He has no wheezes. He has rhonchi in the right lower field and the left lower field. He has no rales.  Skin: Skin is warm and dry.          Assessment & Plan:  URI vs early bronchitis:  Cough, congestion, chills x one week, feeling worse now. Exam today with mild to moderate rhonchi to bilateral lower fields. He does appear ill. Rx for Azithromycin sent to pharmacy. Rx printed for Cheratussin to use HS PRN with drowsiness precautions provided. Fluids, rest, follow up PRN.  Sheral Flow, NP

## 2017-09-05 ENCOUNTER — Other Ambulatory Visit: Payer: Self-pay | Admitting: Primary Care

## 2017-09-05 DIAGNOSIS — E119 Type 2 diabetes mellitus without complications: Secondary | ICD-10-CM

## 2017-09-09 ENCOUNTER — Other Ambulatory Visit: Payer: Self-pay | Admitting: Primary Care

## 2017-09-09 DIAGNOSIS — E782 Mixed hyperlipidemia: Secondary | ICD-10-CM

## 2017-09-19 DIAGNOSIS — M65312 Trigger thumb, left thumb: Secondary | ICD-10-CM | POA: Diagnosis not present

## 2017-09-19 DIAGNOSIS — M65311 Trigger thumb, right thumb: Secondary | ICD-10-CM | POA: Diagnosis not present

## 2017-09-26 ENCOUNTER — Other Ambulatory Visit: Payer: Self-pay | Admitting: Orthopedic Surgery

## 2017-09-26 DIAGNOSIS — E119 Type 2 diabetes mellitus without complications: Secondary | ICD-10-CM | POA: Diagnosis not present

## 2017-09-26 DIAGNOSIS — H25013 Cortical age-related cataract, bilateral: Secondary | ICD-10-CM | POA: Diagnosis not present

## 2017-09-26 DIAGNOSIS — H53001 Unspecified amblyopia, right eye: Secondary | ICD-10-CM | POA: Diagnosis not present

## 2017-09-26 DIAGNOSIS — M65311 Trigger thumb, right thumb: Secondary | ICD-10-CM | POA: Diagnosis not present

## 2017-09-26 DIAGNOSIS — H524 Presbyopia: Secondary | ICD-10-CM | POA: Diagnosis not present

## 2017-09-26 LAB — HM DIABETES EYE EXAM

## 2017-09-27 ENCOUNTER — Telehealth: Payer: Self-pay | Admitting: Primary Care

## 2017-09-27 NOTE — Telephone Encounter (Signed)
Please notify patient that I'm happy to help but he's due for diabetes follow up and I'll need to repeat his A1C and see him in the office before I can write the letter. Please get him scheduled in any available opening, I can create a slot for him if needed.

## 2017-09-27 NOTE — Telephone Encounter (Signed)
Pt came in stating that he needs a paper from [facility] stating that he is maintaining his diabetes. He needs it to state that his blood sugar levels are under 200 and his A1C is under 10. 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 Pt needs ASAP.

## 2017-09-28 ENCOUNTER — Other Ambulatory Visit: Payer: Self-pay

## 2017-09-28 ENCOUNTER — Encounter (HOSPITAL_BASED_OUTPATIENT_CLINIC_OR_DEPARTMENT_OTHER): Payer: Self-pay | Admitting: *Deleted

## 2017-09-28 ENCOUNTER — Encounter: Payer: Self-pay | Admitting: Primary Care

## 2017-09-28 NOTE — Telephone Encounter (Addendum)
Patient has appt on 10/03/2017  Patient already notified of letter and left in the front office for pick up.

## 2017-10-03 ENCOUNTER — Encounter: Payer: Self-pay | Admitting: Primary Care

## 2017-10-03 ENCOUNTER — Other Ambulatory Visit: Payer: Self-pay | Admitting: Primary Care

## 2017-10-03 ENCOUNTER — Ambulatory Visit: Payer: BLUE CROSS/BLUE SHIELD | Admitting: Primary Care

## 2017-10-03 ENCOUNTER — Ambulatory Visit (HOSPITAL_BASED_OUTPATIENT_CLINIC_OR_DEPARTMENT_OTHER)
Admission: RE | Admit: 2017-10-03 | Discharge: 2017-10-03 | Disposition: A | Discharge status: Home / Self Care | Payer: BLUE CROSS/BLUE SHIELD | Source: Ambulatory Visit | Attending: Orthopedic Surgery | Admitting: Orthopedic Surgery

## 2017-10-03 ENCOUNTER — Encounter (HOSPITAL_BASED_OUTPATIENT_CLINIC_OR_DEPARTMENT_OTHER): Payer: Self-pay | Admitting: *Deleted

## 2017-10-03 ENCOUNTER — Telehealth: Payer: Self-pay

## 2017-10-03 ENCOUNTER — Other Ambulatory Visit: Payer: Self-pay

## 2017-10-03 DIAGNOSIS — Z79899 Other long term (current) drug therapy: Secondary | ICD-10-CM | POA: Diagnosis not present

## 2017-10-03 DIAGNOSIS — R350 Frequency of micturition: Secondary | ICD-10-CM | POA: Diagnosis not present

## 2017-10-03 DIAGNOSIS — E785 Hyperlipidemia, unspecified: Secondary | ICD-10-CM

## 2017-10-03 DIAGNOSIS — N400 Enlarged prostate without lower urinary tract symptoms: Secondary | ICD-10-CM

## 2017-10-03 DIAGNOSIS — Z794 Long term (current) use of insulin: Secondary | ICD-10-CM | POA: Diagnosis not present

## 2017-10-03 DIAGNOSIS — I10 Essential (primary) hypertension: Secondary | ICD-10-CM

## 2017-10-03 DIAGNOSIS — E119 Type 2 diabetes mellitus without complications: Secondary | ICD-10-CM

## 2017-10-03 DIAGNOSIS — E1140 Type 2 diabetes mellitus with diabetic neuropathy, unspecified: Secondary | ICD-10-CM | POA: Diagnosis not present

## 2017-10-03 DIAGNOSIS — N401 Enlarged prostate with lower urinary tract symptoms: Secondary | ICD-10-CM | POA: Diagnosis not present

## 2017-10-03 DIAGNOSIS — M479 Spondylosis, unspecified: Secondary | ICD-10-CM | POA: Diagnosis not present

## 2017-10-03 DIAGNOSIS — M65311 Trigger thumb, right thumb: Secondary | ICD-10-CM | POA: Diagnosis not present

## 2017-10-03 DIAGNOSIS — Z87891 Personal history of nicotine dependence: Secondary | ICD-10-CM | POA: Diagnosis not present

## 2017-10-03 LAB — COMPREHENSIVE METABOLIC PANEL
ALT: 21 U/L (ref 0–53)
AST: 12 U/L (ref 0–37)
Albumin: 4.3 g/dL (ref 3.5–5.2)
Alkaline Phosphatase: 28 U/L — ABNORMAL LOW (ref 39–117)
BUN: 20 mg/dL (ref 6–23)
CHLORIDE: 104 meq/L (ref 96–112)
CO2: 28 meq/L (ref 19–32)
Calcium: 10.4 mg/dL (ref 8.4–10.5)
Creatinine, Ser: 1.05 mg/dL (ref 0.40–1.50)
GFR: 75.88 mL/min (ref >60.00)
Glucose, Bld: 192 mg/dL — ABNORMAL HIGH (ref 70–99)
Potassium: 4.4 mEq/L (ref 3.5–5.1)
SODIUM: 139 meq/L (ref 135–145)
Total Bilirubin: 0.5 mg/dL (ref 0.2–1.2)
Total Protein: 7.3 g/dL (ref 6.0–8.3)

## 2017-10-03 LAB — LIPID PANEL
CHOL/HDL RATIO: 7
Cholesterol: 165 mg/dL (ref 0–200)
HDL: 24.7 mg/dL — ABNORMAL LOW (ref >39.00)
NONHDL: 140.47
Triglycerides: 242 mg/dL — ABNORMAL HIGH (ref 0.0–149.0)
VLDL: 48.4 mg/dL — ABNORMAL HIGH (ref 0.0–40.0)

## 2017-10-03 LAB — HEMOGLOBIN A1C: HEMOGLOBIN A1C: 8.6 % — AB (ref 4.6–6.5)

## 2017-10-03 LAB — LDL CHOLESTEROL, DIRECT: LDL DIRECT: 117 mg/dL

## 2017-10-03 MED ORDER — INSULIN GLARGINE 100 UNIT/ML SOLOSTAR PEN: PEN_INJECTOR | SUBCUTANEOUS | 2 refills | Status: DC

## 2017-10-03 NOTE — Assessment & Plan Note (Signed)
Doing well on Flomax, he will update if symptoms return once he holds his medication.

## 2017-10-03 NOTE — Assessment & Plan Note (Addendum)
Stable in the office today, continue losartan. BMP pending.

## 2017-10-03 NOTE — Progress Notes (Signed)
Subjective:    Patient ID: Christian Petty, male    DOB: 11-Jun-1956, 62 y.o.   MRN: [id number]  HPI  Christian Petty is a 62 year old male who presents today for follow up.  1) Type 2 Diabetes:  Current medications include: Glipizide 10 mg BID, Metformin 1000 mg BID, Lantus 30 units HS.   He is checking his blood glucose once weekly and is getting readings of: Fasting: 170 Bedtime: 170  Last A1C: 9.2 in September 2018 Last Eye Exam: March 2019 Last Foot Exam: Due today Pneumonia Vaccination: ACE/ARB: Losartan Statin: Pravastatin  Diet currently consists of:  Breakfast: Cereal, grilled cheese, crackers, Atkins shakes Lunch: Skips Dinner: Sub sandwich, canned food Snacks: Atkins shakes, crackers Desserts: None Beverages: Un-sweet tea, water, milk, diet soda  Exercise: He is not exercising.   2) BPH: Currently managed on Flomax for which he's taken for years. Overall he thinks he's doing well. He may try to stop the medication to see if he still needs it as he has no difficulty with urination.   3) Hyperlipidemia: Currently managed on fenofibrate and pravastatin. He is compliant to his medications and denies myalgias.     Review of Systems  Eyes: Negative for visual disturbance.  Respiratory: Negative for shortness of breath.   Cardiovascular: Negative for chest pain.  Genitourinary: Negative for difficulty urinating.  Musculoskeletal: Negative for myalgias.  Neurological: Negative for numbness.       Past Medical History:  Diagnosis Date  . Arthritis    back  . BPH (benign prostatic hyperplasia)   . Chest pain    (-) cardiolite  07-2006  . Diabetes mellitus 2008  . HTN (hypertension) 2007   BP normal as of 2013 w/o meds   . Hyperlipidemia, mild   . Neuropathy 09/2009  . Urinary frequency      Social History   Socioeconomic History  . Marital status: Married    Spouse name: Not on file  . Number of children: 2  . Years of education: Not on file  . Highest  education level: Not on file  Social Needs  . Financial resource strain: Not on file  . Food insecurity - worry: Not on file  . Food insecurity - inability: Not on file  . Transportation needs - medical: Not on file  . Transportation needs - non-medical: Not on file  Occupational History  . Occupation: truck Geophysicist/field seismologist   Tobacco Use  . Smoking status: Former Smoker    Packs/day: 1.00    Types: E-cigarettes  . Smokeless tobacco: Never Used  . Tobacco comment: quit 2013, occasionally e cigarette  Substance and Sexual Activity  . Alcohol use: Yes    Comment: socially   . Drug use: No  . Sexual activity: Not on file  Other Topics Concern  . Not on file  Social History Narrative   Married.   2 children, 2 grandchildren.   Works for himself as a Administrator.    Past Surgical History:  Procedure Laterality Date  . COLONOSCOPY    . INGUINAL HERNIA REPAIR    . LUMBAR LAMINECTOMY     x2    Family History  Problem Relation Age of Onset  . Diabetes Father   . Breast cancer Mother   . Stroke Unknown 58       F  . Hypertension Neg Hx   . Prostate cancer Neg Hx   . Colon cancer Neg Hx   . Coronary artery disease Neg  Hx   . Esophageal cancer Neg Hx   . Rectal cancer Neg Hx   . Stomach cancer Neg Hx     No Known Allergies  Current Outpatient Medications on File Prior to Visit  Medication Sig Dispense Refill  . fenofibrate 160 MG tablet TAKE 1 TABLET BY MOUTH DAILY 90 tablet 1  . glipiZIDE (GLUCOTROL) 10 MG tablet Take 1 tablet (10 mg total) by mouth 2 (two) times daily before a meal. 180 tablet 3  . glucose blood (ONE TOUCH ULTRA TEST) test strip Use to test blood sugar 2 times daily 100 each 5  . Insulin Pen Needle (CAREFINE PEN NEEDLES) 31G X 8 MM MISC Use with insulin once nightly at bedtime. 90 each 3  . Lancets (ONETOUCH ULTRASOFT) lancets Use to test blood sugar 2 times daily 100 each 5  . LANTUS SOLOSTAR 100 UNIT/ML Solostar Pen INJECT 30 UNITS INTO THE SKIN AT BEDTIME.  15 mL 2  . losartan (COZAAR) 50 MG tablet TAKE 1 TABLET BY MOUTH DAILY 90 tablet 1  . metFORMIN (GLUCOPHAGE) 1000 MG tablet Take 1 tablet (1,000 mg total) by mouth 2 (two) times daily with a meal. 180 tablet 3  . pravastatin (PRAVACHOL) 20 MG tablet TAKE 1 TABLET BY MOUTH DAILY 90 tablet 1  . tamsulosin (FLOMAX) 0.4 MG CAPS capsule TAKE ONE CAPSULE BY MOUTH DAILY AFTER BREAKFAST 30 capsule 5   No current facility-administered medications on file prior to visit.     BP 128/76   Pulse 81   Temp 97.9 F (36.6 C) (Oral)   Ht 5\' 11"  (1.803 m)   Wt 204 lb 8 oz (92.8 kg)   SpO2 97%   BMI 28.52 kg/m    Objective:   Physical Exam  Constitutional: He is oriented to person, place, and time. He appears well-nourished.  Neck: Neck supple.  Cardiovascular: Normal rate and regular rhythm.  Pulmonary/Chest: Effort normal and breath sounds normal. He has no wheezes. He has no rales.  Neurological: He is alert and oriented to person, place, and time.  Skin: Skin is warm and dry.  Psychiatric: He has a normal mood and affect.          Assessment & Plan:

## 2017-10-03 NOTE — Telephone Encounter (Signed)
Noted  

## 2017-10-03 NOTE — Telephone Encounter (Signed)
PEC called [facility] on phone wanting to know when to expect CMP report drawn earlier this morning. I asked Terri in [facility] lab and she said should be reporting soon but Karna Christmas will call and change to stat since needed for in the AM. PEC voiced understanding and will let [facility] be aware.FYI to Allie Bossier NP.

## 2017-10-03 NOTE — Assessment & Plan Note (Signed)
Repeat A1C pending today. Foot exam completed today. Eye exam and pneumonia vaccinations UTD. Managed on ARB and statin.   Strongly advised he start checking his glucose at least twice daily, he verbalized understanding.  If A1C above goal, will increase Lantus. Continue oral medications.

## 2017-10-03 NOTE — Assessment & Plan Note (Signed)
Repeat lipids pending. Continue pravastatin and fenofibrate. CMP pending.

## 2017-10-03 NOTE — Patient Instructions (Signed)
Stop by the lab prior to leaving today. I will notify you of your results once received.   Start checking your blood glucose levels at least twice daily, once in the morning before breakfast and at bedtime.   It is important that you improve your diet. Please limit carbohydrates in the form of white bread, rice, pasta, sweets, fast food, fried food, sugary drinks, etc. Increase your consumption of fresh fruits and vegetables, whole grains, lean protein.  Ensure you are consuming 64 ounces of water daily.  Start exercising. You should be getting 150 minutes of moderate intensity exercise weekly.  Schedule a follow up visit in 6 months.   It was a pleasure to see you today!

## 2017-10-03 NOTE — Progress Notes (Addendum)
Dr. Gifford Shave reviewed EKG - ok for surgery. Pt had CMET drawn at [place] at [facility] this am before coming for EKG. Spoke with receptionist who spoke with flow coordinator and they will run CMET  STAT today and results will be in today.

## 2017-10-04 ENCOUNTER — Other Ambulatory Visit: Payer: Self-pay | Admitting: Primary Care

## 2017-10-04 ENCOUNTER — Ambulatory Visit (HOSPITAL_BASED_OUTPATIENT_CLINIC_OR_DEPARTMENT_OTHER): Payer: BLUE CROSS/BLUE SHIELD | Admitting: Anesthesiology

## 2017-10-04 ENCOUNTER — Telehealth: Payer: Self-pay | Admitting: Primary Care

## 2017-10-04 ENCOUNTER — Encounter (HOSPITAL_BASED_OUTPATIENT_CLINIC_OR_DEPARTMENT_OTHER): Payer: Self-pay

## 2017-10-04 ENCOUNTER — Ambulatory Visit (HOSPITAL_BASED_OUTPATIENT_CLINIC_OR_DEPARTMENT_OTHER)
Admission: RE | Admit: 2017-10-04 | Discharge: 2017-10-04 | Disposition: A | Discharge status: Home / Self Care | Payer: BLUE CROSS/BLUE SHIELD | Source: Ambulatory Visit | Attending: Orthopedic Surgery | Admitting: Orthopedic Surgery

## 2017-10-04 ENCOUNTER — Encounter (HOSPITAL_BASED_OUTPATIENT_CLINIC_OR_DEPARTMENT_OTHER)
Admission: RE | Disposition: A | Discharge status: Home / Self Care | Payer: Self-pay | Source: Ambulatory Visit | Attending: Orthopedic Surgery

## 2017-10-04 DIAGNOSIS — E785 Hyperlipidemia, unspecified: Secondary | ICD-10-CM | POA: Diagnosis not present

## 2017-10-04 DIAGNOSIS — Z87891 Personal history of nicotine dependence: Secondary | ICD-10-CM | POA: Insufficient documentation

## 2017-10-04 DIAGNOSIS — I10 Essential (primary) hypertension: Secondary | ICD-10-CM | POA: Insufficient documentation

## 2017-10-04 DIAGNOSIS — M65311 Trigger thumb, right thumb: Secondary | ICD-10-CM | POA: Diagnosis present

## 2017-10-04 DIAGNOSIS — N401 Enlarged prostate with lower urinary tract symptoms: Secondary | ICD-10-CM | POA: Insufficient documentation

## 2017-10-04 DIAGNOSIS — E1140 Type 2 diabetes mellitus with diabetic neuropathy, unspecified: Secondary | ICD-10-CM | POA: Insufficient documentation

## 2017-10-04 DIAGNOSIS — R350 Frequency of micturition: Secondary | ICD-10-CM | POA: Diagnosis not present

## 2017-10-04 DIAGNOSIS — M479 Spondylosis, unspecified: Secondary | ICD-10-CM | POA: Diagnosis not present

## 2017-10-04 DIAGNOSIS — Z79899 Other long term (current) drug therapy: Secondary | ICD-10-CM | POA: Insufficient documentation

## 2017-10-04 DIAGNOSIS — Z794 Long term (current) use of insulin: Secondary | ICD-10-CM | POA: Diagnosis not present

## 2017-10-04 DIAGNOSIS — E119 Type 2 diabetes mellitus without complications: Secondary | ICD-10-CM | POA: Diagnosis not present

## 2017-10-04 HISTORY — DX: Unspecified osteoarthritis, unspecified site: M19.90

## 2017-10-04 HISTORY — PX: TRIGGER FINGER RELEASE: SHX641

## 2017-10-04 HISTORY — DX: Trigger thumb, right thumb: M65.311

## 2017-10-04 LAB — GLUCOSE, CAPILLARY: Glucose-Capillary: 174 mg/dL — ABNORMAL HIGH (ref 65–99)

## 2017-10-04 SURGERY — RELEASE, A1 PULLEY, FOR TRIGGER FINGER
Anesthesia: Regional | Site: Hand | Laterality: Right

## 2017-10-04 MED ORDER — LIDOCAINE HCL (CARDIAC) 20 MG/ML IV SOLN
INTRAVENOUS | Status: AC
  Filled 2017-10-04: qty 5

## 2017-10-04 MED ORDER — PROPOFOL 500 MG/50ML IV EMUL
INTRAVENOUS | Status: DC | PRN
  Administered 2017-10-04: 25 ug/kg/min via INTRAVENOUS

## 2017-10-04 MED ORDER — FENTANYL CITRATE (PF) 100 MCG/2ML IJ SOLN
INTRAMUSCULAR | Status: AC
  Filled 2017-10-04: qty 2

## 2017-10-04 MED ORDER — FENTANYL CITRATE (PF) 100 MCG/2ML IJ SOLN: 50.0000 ug | INTRAMUSCULAR | Status: DC | PRN

## 2017-10-04 MED ORDER — FENTANYL CITRATE (PF) 100 MCG/2ML IJ SOLN: 25.0000 ug | INTRAMUSCULAR | Status: DC | PRN

## 2017-10-04 MED ORDER — MIDAZOLAM HCL 2 MG/2ML IJ SOLN: 1.0000 mg | INTRAMUSCULAR | Status: DC | PRN

## 2017-10-04 MED ORDER — PROPOFOL 10 MG/ML IV BOLUS
INTRAVENOUS | Status: AC
  Filled 2017-10-04: qty 20

## 2017-10-04 MED ORDER — BUPIVACAINE HCL (PF) 0.25 % IJ SOLN
INTRAMUSCULAR | Status: DC | PRN
  Administered 2017-10-04: 7 mL

## 2017-10-04 MED ORDER — OXYCODONE HCL 5 MG/5ML PO SOLN: 5.0000 mg | Freq: Once | ORAL | Status: DC | PRN

## 2017-10-04 MED ORDER — LIDOCAINE HCL (PF) 0.5 % IJ SOLN
INTRAMUSCULAR | Status: DC | PRN
  Administered 2017-10-04: 30 mL via INTRAVENOUS

## 2017-10-04 MED ORDER — MIDAZOLAM HCL 5 MG/5ML IJ SOLN
INTRAMUSCULAR | Status: DC | PRN
  Administered 2017-10-04: 2 mg via INTRAVENOUS

## 2017-10-04 MED ORDER — FENTANYL CITRATE (PF) 100 MCG/2ML IJ SOLN
INTRAMUSCULAR | Status: DC | PRN
  Administered 2017-10-04 (×2): 25 ug via INTRAVENOUS

## 2017-10-04 MED ORDER — MIDAZOLAM HCL 2 MG/2ML IJ SOLN
INTRAMUSCULAR | Status: AC
  Filled 2017-10-04: qty 2

## 2017-10-04 MED ORDER — MEPERIDINE HCL 25 MG/ML IJ SOLN: 6.2500 mg | INTRAMUSCULAR | Status: DC | PRN

## 2017-10-04 MED ORDER — ACETAMINOPHEN 325 MG PO TABS: 650.0000 mg | ORAL_TABLET | Freq: Four times a day (QID) | ORAL | 1 refills | Status: DC | PRN

## 2017-10-04 MED ORDER — OXYCODONE HCL 5 MG PO TABS: 5.0000 mg | ORAL_TABLET | Freq: Once | ORAL | Status: DC | PRN

## 2017-10-04 MED ORDER — ONDANSETRON HCL 4 MG/2ML IJ SOLN
INTRAMUSCULAR | Status: AC
  Filled 2017-10-04: qty 2

## 2017-10-04 MED ORDER — ONDANSETRON HCL 4 MG/2ML IJ SOLN
INTRAMUSCULAR | Status: DC | PRN
  Administered 2017-10-04: 4 mg via INTRAVENOUS

## 2017-10-04 MED ORDER — CHLORHEXIDINE GLUCONATE 4 % EX LIQD: 60.0000 mL | Freq: Once | CUTANEOUS | Status: DC

## 2017-10-04 MED ORDER — IBUPROFEN 800 MG PO TABS: 800.0000 mg | ORAL_TABLET | Freq: Three times a day (TID) | ORAL | 0 refills | Status: DC | PRN

## 2017-10-04 MED ORDER — CEFAZOLIN SODIUM-DEXTROSE 2-4 GM/100ML-% IV SOLN
INTRAVENOUS | Status: AC
  Filled 2017-10-04: qty 100

## 2017-10-04 MED ORDER — LACTATED RINGERS IV SOLN
INTRAVENOUS | Status: DC
  Administered 2017-10-04 (×2): via INTRAVENOUS

## 2017-10-04 MED ORDER — PRAVASTATIN SODIUM 40 MG PO TABS: ORAL_TABLET | ORAL | 3 refills | Status: DC

## 2017-10-04 MED ORDER — SCOPOLAMINE 1 MG/3DAYS TD PT72: 1.0000 | MEDICATED_PATCH | Freq: Once | TRANSDERMAL | Status: DC | PRN

## 2017-10-04 MED ORDER — PROMETHAZINE HCL 25 MG/ML IJ SOLN: 6.2500 mg | INTRAMUSCULAR | Status: DC | PRN

## 2017-10-04 MED ORDER — DEXAMETHASONE SODIUM PHOSPHATE 10 MG/ML IJ SOLN
INTRAMUSCULAR | Status: AC
  Filled 2017-10-04: qty 1

## 2017-10-04 MED ORDER — CEFAZOLIN SODIUM-DEXTROSE 2-4 GM/100ML-% IV SOLN
2.0000 g | INTRAVENOUS | Status: AC
  Administered 2017-10-04: 2 g via INTRAVENOUS

## 2017-10-04 MED ORDER — ONDANSETRON HCL 4 MG/2ML IJ SOLN
INTRAMUSCULAR | Status: AC
Start: 2017-10-04 — End: ?
  Filled 2017-10-04: qty 2

## 2017-10-04 SURGICAL SUPPLY — 39 items
BANDAGE ACE 3X5.8 VEL STRL LF (GAUZE/BANDAGES/DRESSINGS) ×2 IMPLANT
BLADE SURG 15 STRL LF DISP TIS (BLADE) ×1 IMPLANT
BLADE SURG 15 STRL SS (BLADE) ×1
BNDG COHESIVE 4X5 TAN STRL (GAUZE/BANDAGES/DRESSINGS) IMPLANT
BNDG ESMARK 4X9 LF (GAUZE/BANDAGES/DRESSINGS) IMPLANT
CORD BIPOLAR FORCEPS 12FT (ELECTRODE) ×2 IMPLANT
COVER BACK TABLE 60X90IN (DRAPES) ×2 IMPLANT
CUFF TOURNIQUET SINGLE 18IN (TOURNIQUET CUFF) ×2 IMPLANT
DRAPE EXTREMITY T 121X128X90 (DRAPE) ×2 IMPLANT
DRAPE IMP U-DRAPE 54X76 (DRAPES) ×2 IMPLANT
DRAPE SURG 17X23 STRL (DRAPES) ×2 IMPLANT
DURAPREP 26ML APPLICATOR (WOUND CARE) ×2 IMPLANT
GAUZE SPONGE 4X4 12PLY STRL (GAUZE/BANDAGES/DRESSINGS) ×2 IMPLANT
GAUZE XEROFORM 1X8 LF (GAUZE/BANDAGES/DRESSINGS) ×2 IMPLANT
GLOVE BIO SURGEON STRL SZ7 (GLOVE) ×4 IMPLANT
GLOVE BIO SURGEON STRL SZ8 (GLOVE) ×2 IMPLANT
GLOVE BIOGEL PI IND STRL 7.0 (GLOVE) ×3 IMPLANT
GLOVE BIOGEL PI IND STRL 8 (GLOVE) ×2 IMPLANT
GLOVE BIOGEL PI INDICATOR 7.0 (GLOVE) ×3
GLOVE BIOGEL PI INDICATOR 8 (GLOVE) ×2
GLOVE ORTHO TXT STRL SZ7.5 (GLOVE) ×2 IMPLANT
GOWN STRL REUS W/ TWL LRG LVL3 (GOWN DISPOSABLE) ×2 IMPLANT
GOWN STRL REUS W/ TWL XL LVL3 (GOWN DISPOSABLE) ×2 IMPLANT
GOWN STRL REUS W/TWL LRG LVL3 (GOWN DISPOSABLE) ×2
GOWN STRL REUS W/TWL XL LVL3 (GOWN DISPOSABLE) ×2
NEEDLE HYPO 25X1 1.5 SAFETY (NEEDLE) ×2 IMPLANT
NS IRRIG 1000ML POUR BTL (IV SOLUTION) ×2 IMPLANT
PACK BASIN DAY SURGERY FS (CUSTOM PROCEDURE TRAY) ×2 IMPLANT
PAD CAST 3X4 CTTN HI CHSV (CAST SUPPLIES) ×1 IMPLANT
PADDING CAST ABS 3INX4YD NS (CAST SUPPLIES) ×1
PADDING CAST ABS COTTON 3X4 (CAST SUPPLIES) ×1 IMPLANT
PADDING CAST COTTON 3X4 STRL (CAST SUPPLIES) ×1
SPLINT PLASTER CAST XFAST 3X15 (CAST SUPPLIES) IMPLANT
SPLINT PLASTER XTRA FASTSET 3X (CAST SUPPLIES)
SUT ETHILON 4 0 PS 2 18 (SUTURE) ×2 IMPLANT
SYR BULB 3OZ (MISCELLANEOUS) ×2 IMPLANT
SYR CONTROL 10ML LL (SYRINGE) ×2 IMPLANT
TOWEL OR 17X24 6PK STRL BLUE (TOWEL DISPOSABLE) ×2 IMPLANT
UNDERPAD 30X30 (UNDERPADS AND DIAPERS) ×2 IMPLANT

## 2017-10-04 NOTE — Op Note (Signed)
10/04/2017  11:39 AM  PATIENT:  Christian Petty    PRE-OPERATIVE DIAGNOSIS:  RIGHT TRIGGER THUMB M65.311  POST-OPERATIVE DIAGNOSIS:  Same  PROCEDURE:  RELEASE TRIGGER THUMB, RIGHT  SURGEON:  Johnny Bridge, MD  PHYSICIAN ASSISTANT: Joya Gaskins, OPA-C, present and scrubbed throughout the case, critical for completion in a timely fashion, and for retraction, instrumentation, and closure.  ANESTHESIA:   General  PREOPERATIVE INDICATIONS:  [name] is a  62 y.o. male with a diagnosis of RIGHT TRIGGER THUMB M65.311 who failed conservative measures and elected for surgical management.    The risks benefits and alternatives were discussed with the patient preoperatively including but not limited to the risks of infection, bleeding, nerve injury, cardiopulmonary complications, the need for revision surgery, among others, and the patient was willing to proceed.  ESTIMATED BLOOD LOSS: Minimal  OPERATIVE IMPLANTS: None  OPERATIVE FINDINGS: Thickened tendinosis with some abrasion of the flexor tendons to the thumb, with hypertrophy of the A1 pulley  OPERATIVE PROCEDURE: The patient was brought to the operating room and placed in supine position.  Bier block was administered.  Local anesthesia was used to augment the Bier block, injecting a total of 7 mL of half percent Marcaine plain.  Right upper extremity was prepped and draped in usual sterile fashion.  Timeout performed.  Incision was made over the flexor crease of the thumb, just through the skin, blunt dissection carried down and the digital nerve was not well visualized, but was protected throughout the case, and the A1 pulley identified, incised, and fully released proximally and distally.  The tendon was then able to move freely, and I inspected the tendon which did have some abrasive damage.  It was however intact.  Wounds were irrigated copiously and the skin repaired with nylon.  Sterile gauze was applied.  He was awakened and returned  to the PACU in stable and satisfactory condition.  There were no complications.

## 2017-10-04 NOTE — Anesthesia Preprocedure Evaluation (Signed)
Anesthesia Evaluation  Patient identified by MRN, date of birth, ID band Patient awake    Reviewed: Allergy & Precautions, NPO status , Patient's Chart, lab work & pertinent test results  Airway Mallampati: II  TM Distance: >3 FB Neck ROM: Full    Dental no notable dental hx.    Pulmonary neg pulmonary ROS, former smoker,    Pulmonary exam normal breath sounds clear to auscultation       Cardiovascular hypertension, negative cardio ROS Normal cardiovascular exam Rhythm:Regular Rate:Normal     Neuro/Psych negative neurological ROS  negative psych ROS   GI/Hepatic negative GI ROS, Neg liver ROS,   Endo/Other  negative endocrine ROSdiabetes  Renal/GU negative Renal ROS  negative genitourinary   Musculoskeletal negative musculoskeletal ROS (+)   Abdominal   Peds negative pediatric ROS (+)  Hematology negative hematology ROS (+)   Anesthesia Other Findings   Reproductive/Obstetrics negative OB ROS                             Anesthesia Physical Anesthesia Plan  ASA: II  Anesthesia Plan: Bier Block and Bier Block-LIDOCAINE ONLY   Post-op Pain Management:    Induction: Intravenous  PONV Risk Score and Plan: Ondansetron and Propofol infusion  Airway Management Planned:   Additional Equipment:   Intra-op Plan:   Post-operative Plan:   Informed Consent: I have reviewed the patients History and Physical, chart, labs and discussed the procedure including the risks, benefits and alternatives for the proposed anesthesia with the patient or authorized representative who has indicated his/her understanding and acceptance.   Dental advisory given  Plan Discussed with: CRNA  Anesthesia Plan Comments:         Anesthesia Quick Evaluation

## 2017-10-04 NOTE — Transfer of Care (Signed)
Immediate Anesthesia Transfer of Care Note  Patient: Christian Petty  Procedure(s) Performed: RELEASE TRIGGER THUMB, RIGHT (Right Hand)  Patient Location: PACU  Anesthesia Type:Bier block  Level of Consciousness: awake and patient cooperative  Airway & Oxygen Therapy: Patient Spontanous Breathing and Patient connected to face mask oxygen  Post-op Assessment: Report given to RN and Post -op Vital signs reviewed and stable  Post vital signs: Reviewed and stable  Last Vitals:  Vitals:   10/04/17 0842  BP: 124/68  Pulse: 77  Resp: 18  Temp: 36.8 C  SpO2: 99%    Last Pain:  Vitals:   10/04/17 0842  TempSrc: Oral  PainSc: 3       Patients Stated Pain Goal: 1 (56/81/27 5170)  Complications: No apparent anesthesia complications

## 2017-10-04 NOTE — Telephone Encounter (Signed)
Copied from [id number] [phone]. Topic: Quick Communication - Lab Results >> Oct 04, 2017  9:31 AM Modena Nunnery, CMA wrote: Called patient to inform them of recent lab results. When patient returns call, triage nurse may disclose results.   Pt returning call about lab work.

## 2017-10-04 NOTE — Discharge Instructions (Signed)
Diet: As you were doing prior to hospitalization   Shower:  May shower but keep the wounds dry, use an occlusive plastic wrap, NO SOAKING IN TUB.  If the bandage gets wet, change with a clean dry gauze.  If you have a splint on, leave the splint in place and keep the splint dry with a plastic bag.  Dressing:  You may change your dressing 3-5 days after surgery, unless you have a splint.  If you have a splint, then just leave the splint in place and we will change your bandages during your first follow-up appointment.    If you had hand or foot surgery, we will plan to remove your stitches in about 2 weeks in the office.  For all other surgeries, there are sticky tapes (steri-strips) on your wounds and all the stitches are absorbable.  Leave the steri-strips in place when changing your dressings, they will peel off with time, usually 2-3 weeks.  Activity:  Increase activity slowly as tolerated, but follow the weight bearing instructions below.  The rules on driving is that you can not be taking narcotics while you drive, and you must feel in control of the vehicle.    Weight Bearing:   Keep thumb dry, ok to move when comfortable.    To prevent constipation: you may use a stool softener such as -  Colace (over the counter) 100 mg by mouth twice a day  Drink plenty of fluids (prune juice may be helpful) and high fiber foods Miralax (over the counter) for constipation as needed.    Itching:  If you experience itching with your medications, try taking only a single pain pill, or even half a pain pill at a time.  You may take up to 10 pain pills per day, and you can also use benadryl over the counter for itching or also to help with sleep.   Precautions:  If you experience chest pain or shortness of breath - call 911 immediately for transfer to the hospital emergency department!!  If you develop a fever greater that 101 F, purulent drainage from wound, increased redness or drainage from wound, or  calf pain -- Call the office at [phone]                                                Follow- Up Appointment:  Please call for an appointment to be seen in 2 weeks [place] - [phone]      Post Anesthesia Home Care Instructions  Activity: Get plenty of rest for the remainder of the day. A responsible individual must stay with you for 24 hours following the procedure.  For the next 24 hours, DO NOT: -Drive a car -Paediatric nurse -Drink alcoholic beverages -Take any medication unless instructed by your physician -Make any legal decisions or sign important papers.  Meals: Start with liquid foods such as gelatin or soup. Progress to regular foods as tolerated. Avoid greasy, spicy, heavy foods. If nausea and/or vomiting occur, drink only clear liquids until the nausea and/or vomiting subsides. Call your physician if vomiting continues.  Special Instructions/Symptoms: Your throat may feel dry or sore from the anesthesia or the breathing tube placed in your throat during surgery. If this causes discomfort, gargle with warm salt water. The discomfort should disappear within 24 hours.  If you had a scopolamine patch placed  behind your ear for the management of post- operative nausea and/or vomiting:  1. The medication in the patch is effective for 72 hours, after which it should be removed.  Wrap patch in a tissue and discard in the trash. Wash hands thoroughly with soap and water. 2. You may remove the patch earlier than 72 hours if you experience unpleasant side effects which may include dry mouth, dizziness or visual disturbances. 3. Avoid touching the patch. Wash your hands with soap and water after contact with the patch.

## 2017-10-04 NOTE — Anesthesia Procedure Notes (Signed)
Anesthesia Regional Block: Bier block (IV Regional)   Pre-Anesthetic Checklist: ,, timeout performed, Correct Patient, Correct Site, Correct Laterality, Correct Procedure, Correct Position, site marked, Risks and benefits discussed,  Surgical consent,  Pre-op evaluation,  At surgeon's request and post-op pain management  Laterality: Right  Prep: chloraprep        Procedures:,,,,,, Esmarch exsanguination,,  Narrative:  Start time: 10/04/2017 10:52 AM End time: 10/04/2017 10:55 AM

## 2017-10-04 NOTE — Anesthesia Procedure Notes (Signed)
Procedure Name: MAC Date/Time: 10/04/2017 10:44 AM Performed by: Marrianne Mood, CRNA Pre-anesthesia Checklist: Patient identified, Timeout performed, Emergency Drugs available, Suction available and Patient being monitored Patient Re-evaluated:Patient Re-evaluated prior to induction Oxygen Delivery Method: Simple face mask Preoxygenation: Pre-oxygenation with 100% oxygen

## 2017-10-04 NOTE — Anesthesia Postprocedure Evaluation (Signed)
Anesthesia Post Note  Patient: Christian Petty  Procedure(s) Performed: RELEASE TRIGGER THUMB, RIGHT (Right Hand)     Patient location during evaluation: PACU Anesthesia Type: Bier Block Level of consciousness: awake and alert Pain management: pain level controlled Vital Signs Assessment: post-procedure vital signs reviewed and stable Respiratory status: spontaneous breathing Cardiovascular status: stable Anesthetic complications: no    Last Vitals:  Vitals:   10/04/17 1200 10/04/17 1210  BP: 129/86 140/72  Pulse: 73 64  Resp: 18 16  Temp:  36.6 C  SpO2: 99% 100%    Last Pain:  Vitals:   10/04/17 1210  TempSrc:   PainSc: 0-No pain                 Nolon Nations

## 2017-10-04 NOTE — H&P (Signed)
PREOPERATIVE H&P  Chief Complaint: Right thumb locking  HPI: Christian Petty is a 62 y.o. male who presents for preoperative history and physical with a diagnosis of right trigger thumb, failed conservative measures including injections bracing and anti-inflammatories. Symptoms are rated as moderate to severe, and have been worsening.  This is significantly impairing activities of daily living.  He has elected for surgical management.   Past Medical History:  Diagnosis Date  . Arthritis    back  . BPH (benign prostatic hyperplasia)   . Chest pain    (-) cardiolite  07-2006  . Diabetes mellitus 2008  . HTN (hypertension) 2007   BP normal as of 2013 w/o meds   . Hyperlipidemia, mild   . Neuropathy 09/2009  . Urinary frequency    Past Surgical History:  Procedure Laterality Date  . COLONOSCOPY    . INGUINAL HERNIA REPAIR    . LUMBAR LAMINECTOMY     x2   Social History   Socioeconomic History  . Marital status: Married    Spouse name: None  . Number of children: 2  . Years of education: None  . Highest education level: None  Social Needs  . Financial resource strain: None  . Food insecurity - worry: None  . Food insecurity - inability: None  . Transportation needs - medical: None  . Transportation needs - non-medical: None  Occupational History  . Occupation: truck Geophysicist/field seismologist   Tobacco Use  . Smoking status: Former Smoker    Packs/day: 1.00    Types: Cigarettes  . Smokeless tobacco: Never Used  . Tobacco comment: quit 2013, occasionally e cigarette  Substance and Sexual Activity  . Alcohol use: Yes    Comment: socially   . Drug use: No  . Sexual activity: None  Other Topics Concern  . None  Social History Narrative   Married.   2 children, 2 grandchildren.   Works for himself as a Administrator.   Family History  Problem Relation Age of Onset  . Diabetes Father   . Breast cancer Mother   . Stroke Unknown 47       F  . Hypertension Neg Hx   . Prostate cancer Neg Hx    . Colon cancer Neg Hx   . Coronary artery disease Neg Hx   . Esophageal cancer Neg Hx   . Rectal cancer Neg Hx   . Stomach cancer Neg Hx    No Known Allergies Prior to Admission medications   Medication Sig Start Date End Date Taking? Authorizing Provider  fenofibrate 160 MG tablet TAKE 1 TABLET BY MOUTH DAILY 09/09/17  Yes Pleas Koch, NP  glipiZIDE (GLUCOTROL) 10 MG tablet Take 1 tablet (10 mg total) by mouth 2 (two) times daily before a meal. 01/12/17  Yes Pleas Koch, NP  Insulin Glargine (LANTUS SOLOSTAR) 100 UNIT/ML Solostar Pen INJECT 34 UNITS INTO THE SKIN AT BEDTIME. 10/03/17  Yes Pleas Koch, NP  losartan (COZAAR) 50 MG tablet TAKE 1 TABLET BY MOUTH DAILY 05/27/17  Yes Pleas Koch, NP  metFORMIN (GLUCOPHAGE) 1000 MG tablet Take 1 tablet (1,000 mg total) by mouth 2 (two) times daily with a meal. 02/04/17  Yes Pleas Koch, NP  pravastatin (PRAVACHOL) 20 MG tablet TAKE 1 TABLET BY MOUTH DAILY 05/02/17  Yes Pleas Koch, NP  tamsulosin (FLOMAX) 0.4 MG CAPS capsule TAKE ONE CAPSULE BY MOUTH DAILY AFTER BREAKFAST 04/15/17  Yes Pleas Koch, NP  glucose blood (ONE  TOUCH ULTRA TEST) test strip Use to test blood sugar 2 times daily 03/31/17   Pleas Koch, NP  Insulin Pen Needle (CAREFINE PEN NEEDLES) 31G X 8 MM MISC Use with insulin once nightly at bedtime. 11/15/16   Pleas Koch, NP  Lancets [facility] ULTRASOFT) lancets Use to test blood sugar 2 times daily 03/31/17   Pleas Koch, NP     Positive ROS: All other systems have been reviewed and were otherwise negative with the exception of those mentioned in the HPI and as above.  Physical Exam: General: Alert, no acute distress Cardiovascular: No pedal edema Respiratory: No cyanosis, no use of accessory musculature GI: No organomegaly, abdomen is soft and non-tender Skin: No lesions in the area of chief complaint Neurologic: Sensation intact distally Psychiatric: Patient is  competent for consent with normal mood and affect Lymphatic: No axillary or cervical lymphadenopathy  MUSCULOSKELETAL: Right thumb and sensation intact throughout, but he lacks with a palpable nodule at the A1 pulley  Assessment: Right trigger thumb   Plan: Plan for Procedure(s): RELEASE TRIGGER THUMB, RIGHT  The risks benefits and alternatives were discussed with the patient including but not limited to the risks of nonoperative treatment, versus surgical intervention including infection, bleeding, nerve injury,  blood clots, cardiopulmonary complications, morbidity, mortality, among others, and they were willing to proceed.      Johnny Bridge, MD Cell [phone]   10/04/2017 10:40 AM

## 2017-10-04 NOTE — Telephone Encounter (Signed)
Patient notified- see lab note

## 2017-10-05 ENCOUNTER — Encounter (HOSPITAL_BASED_OUTPATIENT_CLINIC_OR_DEPARTMENT_OTHER): Payer: Self-pay | Admitting: Orthopedic Surgery

## 2017-10-17 DIAGNOSIS — M65312 Trigger thumb, left thumb: Secondary | ICD-10-CM | POA: Diagnosis not present

## 2017-10-17 HISTORY — DX: Trigger thumb, left thumb: M65.312

## 2017-10-28 ENCOUNTER — Other Ambulatory Visit: Payer: Self-pay

## 2017-10-28 ENCOUNTER — Encounter (HOSPITAL_BASED_OUTPATIENT_CLINIC_OR_DEPARTMENT_OTHER): Payer: Self-pay | Admitting: *Deleted

## 2017-10-28 NOTE — Progress Notes (Signed)
   10/28/17 1201  OBSTRUCTIVE SLEEP APNEA  Have you ever been diagnosed with sleep apnea through a sleep study? No  Do you snore loudly (loud enough to be heard through closed doors)?  1  Do you often feel tired, fatigued, or sleepy during the daytime (such as falling asleep during driving or talking to someone)? 0  Has anyone observed you stop breathing during your sleep? 0  Do you have, or are you being treated for high blood pressure? 1  BMI more than 35 kg/m2? 0  Age > 83 (1-yes) 1  Male Gender (Yes=1) 1  Obstructive Sleep Apnea Score 4  Score 5 or greater  Results sent to PCP Alma Friendly, NP)

## 2017-10-28 NOTE — Pre-Procedure Instructions (Signed)
To come for BMET 

## 2017-10-29 ENCOUNTER — Telehealth: Payer: Self-pay | Admitting: Primary Care

## 2017-10-29 NOTE — Telephone Encounter (Signed)
Please notify patient that I received a message from the surgical nurse who is suspicious for sleep apnea in Christian Petty.  On a scale of 0-3 please have him answer these questions in regards to dozing off (sleeping). How much of a chance is he to doze of during the circumstances below? Will you give me the total score? 0= No chance 1=Slight chance 2=Moderate chance 3=High chance  1. Sitting and reading 2. Watching TV 3. Sitting in public (watching a movie, in a meeting, etc) 4. Passenger in the car over one hour 5. Lying down to rest in the afternoon 6. Sitting and talking with someone 7. Sitting after lunch 8. In a car while for a few minutes in traffic  Thanks!

## 2017-10-31 NOTE — Telephone Encounter (Signed)
Message left for patient to return my call.  

## 2017-11-02 ENCOUNTER — Encounter (HOSPITAL_BASED_OUTPATIENT_CLINIC_OR_DEPARTMENT_OTHER)
Admission: RE | Admit: 2017-11-02 | Discharge: 2017-11-02 | Disposition: A | Discharge status: Home / Self Care | Payer: BLUE CROSS/BLUE SHIELD | Source: Ambulatory Visit | Attending: Orthopedic Surgery | Admitting: Orthopedic Surgery

## 2017-11-02 DIAGNOSIS — Z01812 Encounter for preprocedural laboratory examination: Secondary | ICD-10-CM | POA: Diagnosis not present

## 2017-11-02 LAB — BASIC METABOLIC PANEL
Anion gap: 11 (ref 5–15)
BUN: 21 mg/dL — AB (ref 6–20)
CALCIUM: 9.4 mg/dL (ref 8.9–10.3)
CO2: 23 mmol/L (ref 22–32)
CREATININE: 1.05 mg/dL (ref 0.61–1.24)
Chloride: 98 mmol/L — ABNORMAL LOW (ref 101–111)
GFR calc non Af Amer: >60 mL/min (ref >60)
Glucose, Bld: 523 mg/dL (ref 65–99)
Potassium: 4.7 mmol/L (ref 3.5–5.1)
Sodium: 132 mmol/L — ABNORMAL LOW (ref 135–145)

## 2017-11-02 NOTE — Progress Notes (Signed)
Critical lab result glucose 523, notified Dr. Fransisco Beau of Anesthesia, pt will have to see primary before surgery, Notified Dr. Luanna Cole office.  Notified patient, who states, "I feel fine", encouraged pt to check blood sugar and to seek medical attention.

## 2017-11-03 NOTE — Telephone Encounter (Signed)
Patient stated   1. Sitting and reading------------ 0 2. Watching TV----------------- 0 3. Sitting in public (watching a movie, in a meeting, etc) ----------------- 1 4. Passenger in the car over one hour ----- 0 5. Lying down to rest in the afternoon ------ 0 6. Sitting and talking with someone -------- 0 7. Sitting after lunch -------------------- 0 8. In a car while for a few minutes in traffic -- 0  Patient had some blood work done on 11/02/2017 and his sugar was in the 500s. Patient is concern since his sugar is high and he was told that they would need his  primary provider confirm that patient can have this surgery. Patient stated that he can come in for a office visit or lab to confirm this.  Also for Allie Bossier to be aware he did forget to take his insulin for 2 days. Yesterday before the blood work, he ate some cornflakes with milk.

## 2017-11-04 NOTE — Telephone Encounter (Signed)
Patient's A1C in March was 8.6 which was an improvement. His glucose was likely elevated as he missed several days of his insulin. Please have him write down his glucose readings as he's checking (before breakfast, 2 hours after lunch, bedtime). We will see him in the office to discuss. Let's schedule a 30 minute visit so we can also discuss sleep apnea concerns.

## 2017-11-07 ENCOUNTER — Ambulatory Visit: Payer: Self-pay | Admitting: *Deleted

## 2017-11-07 NOTE — Telephone Encounter (Signed)
We will likely need to start another insulin but this will have to wait until he's seen on 04/24. He will need to increase his Lantus to 38 units nightly. Have him increase water intake over the next several days until we can see him, this should help to lower his glucose. Please have him go to the ER in [place] if he doesn't see an improvement in glucose readings, otherwise we'll see him as scheduled.

## 2017-11-07 NOTE — Telephone Encounter (Signed)
Spoken and notified patient of Kate Clark's comments. Patient verbalized understanding.  

## 2017-11-07 NOTE — Telephone Encounter (Signed)
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   Reason for Disposition . Blood glucose > 400 mg/dl (22 mmol/l)  Answer Assessment - Initial Assessment Questions 1. BLOOD GLUCOSE: "What is your blood glucose level?"      411 2. ONSET: "When did you check the blood glucose?"     11/07/17 when he got up 3. USUAL RANGE: "What is your glucose level usually?" (e.g., usual fasting morning value, usual evening value)     ? 4. KETONES: "Do you check for ketones (urine or blood test strips)?" If yes, ask: "What does the test show now?"      no 5. TYPE 1 or 2:  "Do you know what type of diabetes you have?"  (e.g., Type 1, Type 2, Gestational; doesn't know)      Type 2 6. INSULIN: "Do you take insulin?" If yes, ask: "Have you missed any shots recently?"     Yes; not missed any doses 7. DIABETES PILLS: "Do you take any pills for your diabetes?" If yes, ask: "Have you missed taking any pills recently?"    Yes, no missed doses 8. OTHER SYMPTOMS: "Do you have any symptoms?" (e.g., fever, frequent urination, difficulty breathing, dizziness, weakness, vomiting)     no 9. PREGNANCY: "Is there any chance you are pregnant?" "When was your last menstrual period?"     n/a  Protocols used: DIABETES - HIGH BLOOD SUGAR-A-AH

## 2017-11-07 NOTE — Telephone Encounter (Signed)
Spoken and notified patient of Christian Petty comments in a telephone encounter earlier today.. Patient verbalized understanding.

## 2017-11-09 ENCOUNTER — Ambulatory Visit: Payer: BLUE CROSS/BLUE SHIELD | Admitting: Primary Care

## 2017-11-09 ENCOUNTER — Encounter: Payer: Self-pay | Admitting: Primary Care

## 2017-11-09 DIAGNOSIS — E119 Type 2 diabetes mellitus without complications: Secondary | ICD-10-CM

## 2017-11-09 MED ORDER — INSULIN GLARGINE 100 UNIT/ML SOLOSTAR PEN: PEN_INJECTOR | SUBCUTANEOUS | 5 refills | Status: DC

## 2017-11-09 NOTE — Progress Notes (Signed)
Subjective:    Patient ID: Christian Petty, male    DOB: Oct 21, 1955, 62 y.o.   MRN: [id number]  HPI  Christian Petty is a 62 year old male who presents today with a chief complaint of hyperglycemia. He phoned into our Triage system several days ago while in [place] with reports of glucose readings of 411.   He is currently managed on Glipizide 10 mg BID, metformin 1000 mg BID, Lantus 34 units HS. We increased his Lantus to 38 two days ago after reporting glucose readings of 400's.   He's been checking his glucose levels twice daily: Fasting AM: mid 300's 400;s Before Dinner: 300's-400's.  His glucose reading over the last two days have been 226, 230.  Diet currently consists of: He admits to eating poor diet that included a binge of ice cream for weeks at a time over the last one month. He has seen the diabetic nutritionist.  Breakfast: Crackers, fast food sometimes Lunch: Skips Dinner: Geryl Councilman, Crackers, salads Snacks: Sugar free cookies, ice cream, crackers, peanut butter Desserts: Daily ice cream for several weeks. Daily sugar free cookies Beverages: Gatorade Zero, tea with Splenda, water, milk   Exercise: He's been walking 30 minutes 3-4 days weekly.    Review of Systems  Constitutional: Positive for fatigue.  Eyes: Negative for visual disturbance.  Respiratory: Negative for shortness of breath.   Cardiovascular: Negative for chest pain.  Genitourinary: Positive for frequency.  Neurological: Negative for dizziness and headaches.       Numbness/tingling to hands. Recent trigger finger procedure.        Past Medical History:  Diagnosis Date  . BPH (benign prostatic hyperplasia)   . HTN (hypertension)    states under control with med., has been on med. x 2-3 yr.  . Hyperlipidemia, mild   . Insulin dependent diabetes mellitus ([place])   . Trigger thumb of left hand 10/2017     Social History   Socioeconomic History  . Marital status: Married    Spouse name: Not on file    . Number of children: 2  . Years of education: Not on file  . Highest education level: Not on file  Occupational History  . Occupation: truck Surveyor, [employer]  . Financial resource strain: Not on file  . Food insecurity:    Worry: Not on file    Inability: Not on file  . Transportation needs:    Medical: Not on file    Non-medical: Not on file  Tobacco Use  . Smoking status: Former Smoker    Packs/day: 0.00    Last attempt to quit: 11/16/2010    Years since quitting: 6.9  . Smokeless tobacco: Never Used  Substance and Sexual Activity  . Alcohol use: Yes    Comment: occasionally  . Drug use: No  . Sexual activity: Not on file  Lifestyle  . Physical activity:    Days per week: Not on file    Minutes per session: Not on file  . Stress: Not on file  Relationships  . Social connections:    Talks on phone: Not on file    Gets together: Not on file    Attends religious service: Not on file    Active member of club or organization: Not on file    Attends meetings of clubs or organizations: Not on file    Relationship status: Not on file  . Intimate partner violence:    Fear of current or ex partner:  Not on file    Emotionally abused: Not on file    Physically abused: Not on file    Forced sexual activity: Not on file  Other Topics Concern  . Not on file  Social History Narrative   Married.   2 children, 2 grandchildren.   Works for himself as a Administrator.    Past Surgical History:  Procedure Laterality Date  . COLONOSCOPY  06/19/2006  . COLONOSCOPY WITH PROPOFOL  09/28/2016  . INGUINAL HERNIA REPAIR    . LUMBAR LAMINECTOMY     x 2  . TRIGGER FINGER RELEASE Right 10/04/2017   Procedure: RELEASE TRIGGER THUMB, RIGHT;  Surgeon: Marchia Bond, MD;  Location: [facility];  Service: Orthopedics;  Laterality: Right;    Family History  Problem Relation Age of Onset  . Diabetes Father   . Breast cancer Mother   . Stroke Unknown 61       F     No Known Allergies  Current Outpatient Medications on File Prior to Visit  Medication Sig Dispense Refill  . fenofibrate 160 MG tablet TAKE 1 TABLET BY MOUTH DAILY 90 tablet 1  . glipiZIDE (GLUCOTROL) 10 MG tablet Take 1 tablet (10 mg total) by mouth 2 (two) times daily before a meal. 180 tablet 3  . glucose blood (ONE TOUCH ULTRA TEST) test strip Use to test blood sugar 2 times daily 100 each 5  . Insulin Pen Needle (CAREFINE PEN NEEDLES) 31G X 8 MM MISC Use with insulin once nightly at bedtime. 90 each 3  . Lancets (ONETOUCH ULTRASOFT) lancets Use to test blood sugar 2 times daily 100 each 5  . losartan (COZAAR) 50 MG tablet TAKE 1 TABLET BY MOUTH DAILY 90 tablet 1  . metFORMIN (GLUCOPHAGE) 1000 MG tablet Take 1 tablet (1,000 mg total) by mouth 2 (two) times daily with a meal. 180 tablet 3  . pravastatin (PRAVACHOL) 40 MG tablet Take 1 tablet by mouth every evening for high cholesterol. 90 tablet 3  . tamsulosin (FLOMAX) 0.4 MG CAPS capsule TAKE ONE CAPSULE BY MOUTH DAILY AFTER BREAKFAST 30 capsule 5   No current facility-administered medications on file prior to visit.     BP 118/60 (BP Location: Right Arm, Patient Position: Sitting, Cuff Size: Normal)   Pulse 90   Temp 98.1 F (36.7 C) (Oral)   Ht 5\' 11"  (1.803 m)   Wt 213 lb 4 oz (96.7 kg)   SpO2 96%   BMI 29.74 kg/m    Objective:   Physical Exam  Constitutional: He appears well-nourished.  Neck: Neck supple.  Cardiovascular: Normal rate and regular rhythm.  Pulmonary/Chest: Effort normal and breath sounds normal.  Skin: Skin is warm and dry.          Assessment & Plan:

## 2017-11-09 NOTE — Assessment & Plan Note (Signed)
Hyperglycemia over the past several weeks that is highly likely secondary to poor diet (ice cream binge). He's already seeing improvement in glucose readings (mid 200's) since Lantus was increased to 38 units.   Increase Lantus to 40 units for additional boost. Long discussion about diet and effects on diabetes, information provided regarding a diabetic diet.   Will have him follow up in 2 months for diabetes check. He will call sooner if glucose readings remain at or above 250.

## 2017-11-09 NOTE — Patient Instructions (Signed)
We've increased your Lantus to 40 units at bedtime.   Continue checking your blood sugars, try to check three times daily: Before breakfast, 2 hours after lunch, bedtime or before dinner.  Please call me if you continue to notice readings above 250. Please call me if you get readings below 100.  You MUST work on [employer]. Follow the instructions from the nutritionist.  Eat lean protein, vegetables, fruit, whole grains. Do not eat ice cream, sugary foods, fried/fast food. Limit sugar free cookies.   Start exercising. You should be getting 150 minutes of moderate intensity exercise weekly.  Please schedule a follow up appointment in 2 months for diabetes check.  It was a pleasure to see you today!   Diabetes Mellitus and Nutrition When you have diabetes (diabetes mellitus), it is very important to have healthy eating habits because your blood sugar (glucose) levels are greatly affected by what you eat and drink. Eating healthy foods in the appropriate amounts, at about the same times every day, can help you:  Control your blood glucose.  Lower your risk of heart disease.  Improve your blood pressure.  Reach or maintain a healthy weight.  Every person with diabetes is different, and each person has different needs for a meal plan. Your health care provider may recommend that you work with a diet and nutrition specialist (dietitian) to make a meal plan that is best for you. Your meal plan may vary depending on factors such as:  The calories you need.  The medicines you take.  Your weight.  Your blood glucose, blood pressure, and cholesterol levels.  Your activity level.  Other health conditions you have, such as heart or kidney disease.  How do carbohydrates affect me? Carbohydrates affect your blood glucose level more than any other type of food. Eating carbohydrates naturally increases the amount of glucose in your blood. Carbohydrate counting is a method for keeping track  of how many carbohydrates you eat. Counting carbohydrates is important to keep your blood glucose at a healthy level, especially if you use insulin or take certain oral diabetes medicines. It is important to know how many carbohydrates you can safely have in each meal. This is different for every person. Your dietitian can help you calculate how many carbohydrates you should have at each meal and for snack. Foods that contain carbohydrates include:  Bread, cereal, rice, pasta, and crackers.  Potatoes and corn.  Peas, beans, and lentils.  Milk and yogurt.  Fruit and juice.  Desserts, such as cakes, cookies, ice cream, and candy.  How does alcohol affect me? Alcohol can cause a sudden decrease in blood glucose (hypoglycemia), especially if you use insulin or take certain oral diabetes medicines. Hypoglycemia can be a life-threatening condition. Symptoms of hypoglycemia (sleepiness, dizziness, and confusion) are similar to symptoms of having too much alcohol. If your health care provider says that alcohol is safe for you, follow these guidelines:  Limit alcohol intake to no more than 1 drink per day for nonpregnant women and 2 drinks per day for men. One drink equals 12 oz of beer, 5 oz of wine, or 1 oz of hard liquor.  Do not drink on an empty stomach.  Keep yourself hydrated with water, diet soda, or unsweetened iced tea.  Keep in mind that regular soda, juice, and other mixers may contain a lot of sugar and must be counted as carbohydrates.  What are tips for following this plan? Reading food labels  Start by checking  the serving size on the label. The amount of calories, carbohydrates, fats, and other nutrients listed on the label are based on one serving of the food. Many foods contain more than one serving per package.  Check the total grams (g) of carbohydrates in one serving. You can calculate the number of servings of carbohydrates in one serving by dividing the total  carbohydrates by 15. For example, if a food has 30 g of total carbohydrates, it would be equal to 2 servings of carbohydrates.  Check the number of grams (g) of saturated and trans fats in one serving. Choose foods that have low or no amount of these fats.  Check the number of milligrams (mg) of sodium in one serving. Most people should limit total sodium intake to less than 2,300 mg per day.  Always check the nutrition information of foods labeled as "low-fat" or "nonfat". These foods may be higher in added sugar or refined carbohydrates and should be avoided.  Talk to your dietitian to identify your daily goals for nutrients listed on the label. Shopping  Avoid buying canned, premade, or processed foods. These foods tend to be high in fat, sodium, and added sugar.  Shop around the outside edge of the grocery store. This includes fresh fruits and vegetables, bulk grains, fresh meats, and fresh dairy. Cooking  Use low-heat cooking methods, such as baking, instead of high-heat cooking methods like deep frying.  Cook using healthy oils, such as olive, canola, or sunflower oil.  Avoid cooking with butter, cream, or high-fat meats. Meal planning  Eat meals and snacks regularly, preferably at the same times every day. Avoid going long periods of time without eating.  Eat foods high in fiber, such as fresh fruits, vegetables, beans, and whole grains. Talk to your dietitian about how many servings of carbohydrates you can eat at each meal.  Eat 4-6 ounces of lean protein each day, such as lean meat, chicken, fish, eggs, or tofu. 1 ounce is equal to 1 ounce of meat, chicken, or fish, 1 egg, or 1/4 cup of tofu.  Eat some foods each day that contain healthy fats, such as avocado, nuts, seeds, and fish. Lifestyle   Check your blood glucose regularly.  Exercise at least 30 minutes 5 or more days each week, or as told by your health care provider.  Take medicines as told by your health care  provider.  Do not use any products that contain nicotine or tobacco, such as cigarettes and e-cigarettes. If you need help quitting, ask your health care provider.  Work with a Social worker or diabetes educator to identify strategies to manage stress and any emotional and social challenges. What are some questions to ask my health care provider?  Do I need to meet with a diabetes educator?  Do I need to meet with a dietitian?  What number can I call if I have questions?  When are the best times to check my blood glucose? Where to find more information:  American Diabetes Association: diabetes.org/food-and-fitness/food  Academy of Nutrition and Dietetics: PokerClues.dk  [employer] of Diabetes and Digestive and Kidney Diseases (NIH): ContactWire.be Summary  A healthy meal plan will help you control your blood glucose and maintain a healthy lifestyle.  Working with a diet and nutrition specialist (dietitian) can help you make a meal plan that is best for you.  Keep in mind that carbohydrates and alcohol have immediate effects on your blood glucose levels. It is important to count carbohydrates and to  use alcohol carefully. This information is not intended to replace advice given to you by your health care provider. Make sure you discuss any questions you have with your health care provider. Document Released: 04/01/2005 Document Revised: 08/09/2016 Document Reviewed: 08/09/2016 Elsevier Interactive Patient Education  [employer].

## 2017-11-15 ENCOUNTER — Encounter (HOSPITAL_BASED_OUTPATIENT_CLINIC_OR_DEPARTMENT_OTHER): Payer: Self-pay | Admitting: Anesthesiology

## 2017-11-15 ENCOUNTER — Ambulatory Visit (HOSPITAL_BASED_OUTPATIENT_CLINIC_OR_DEPARTMENT_OTHER)
Admission: RE | Admit: 2017-11-15 | Payer: BLUE CROSS/BLUE SHIELD | Source: Ambulatory Visit | Admitting: Orthopedic Surgery

## 2017-11-15 HISTORY — DX: Reserved for inherently not codable concepts without codable children: IMO0001

## 2017-11-15 HISTORY — DX: Trigger thumb, left thumb: M65.312

## 2017-11-15 HISTORY — DX: Type 2 diabetes mellitus without complications: E11.9

## 2017-11-15 HISTORY — DX: Long term (current) use of insulin: Z79.4

## 2017-11-15 SURGERY — RELEASE, A1 PULLEY, FOR TRIGGER FINGER
Anesthesia: Choice | Laterality: Left

## 2017-11-15 NOTE — Anesthesia Preprocedure Evaluation (Deleted)
Anesthesia Evaluation  Patient identified by MRN, date of birth, ID band Patient awake    Reviewed: Allergy & Precautions, NPO status , Patient's Chart, lab work & pertinent test results  Airway Mallampati: II  TM Distance: >3 FB Neck ROM: Full    Dental no notable dental hx.    Pulmonary former smoker,    Pulmonary exam normal breath sounds clear to auscultation       Cardiovascular hypertension, Normal cardiovascular exam Rhythm:Regular Rate:Normal     Neuro/Psych negative neurological ROS  negative psych ROS   GI/Hepatic negative GI ROS, Neg liver ROS,   Endo/Other  diabetes, Type 2  Renal/GU negative Renal ROS  negative genitourinary   Musculoskeletal negative musculoskeletal ROS (+)   Abdominal   Peds negative pediatric ROS (+)  Hematology negative hematology ROS (+)   Anesthesia Other Findings   Reproductive/Obstetrics negative OB ROS                             Anesthesia Physical  Anesthesia Plan  ASA: II  Anesthesia Plan: Bier Block and MAC and Bier Block-LIDOCAINE ONLY   Post-op Pain Management:    Induction: Intravenous  PONV Risk Score and Plan: 1 and Ondansetron, Propofol infusion and Treatment may vary due to age or medical condition  Airway Management Planned: Nasal Cannula and Natural Airway  Additional Equipment:   Intra-op Plan:   Post-operative Plan:   Informed Consent: I have reviewed the patients History and Physical, chart, labs and discussed the procedure including the risks, benefits and alternatives for the proposed anesthesia with the patient or authorized representative who has indicated his/her understanding and acceptance.   Dental advisory given  Plan Discussed with: CRNA, Anesthesiologist and Surgeon  Anesthesia Plan Comments:         Anesthesia Quick Evaluation

## 2017-11-24 ENCOUNTER — Other Ambulatory Visit: Payer: Self-pay | Admitting: Primary Care

## 2017-12-23 ENCOUNTER — Ambulatory Visit: Payer: BLUE CROSS/BLUE SHIELD | Admitting: Primary Care

## 2017-12-23 ENCOUNTER — Encounter: Payer: Self-pay | Admitting: Primary Care

## 2017-12-23 ENCOUNTER — Other Ambulatory Visit: Payer: Self-pay

## 2017-12-23 VITALS — BP 132/62 | HR 92 | Temp 98.2°F | Wt 206.2 lb

## 2017-12-23 DIAGNOSIS — E119 Type 2 diabetes mellitus without complications: Secondary | ICD-10-CM

## 2017-12-23 DIAGNOSIS — R011 Cardiac murmur, unspecified: Secondary | ICD-10-CM

## 2017-12-23 DIAGNOSIS — E1165 Type 2 diabetes mellitus with hyperglycemia: Secondary | ICD-10-CM | POA: Diagnosis not present

## 2017-12-23 LAB — POCT GLYCOSYLATED HEMOGLOBIN (HGB A1C): Hemoglobin A1C: 8.2 % — AB (ref 4.0–5.6)

## 2017-12-23 MED ORDER — INSULIN GLARGINE 100 UNIT/ML SOLOSTAR PEN: PEN_INJECTOR | SUBCUTANEOUS | 5 refills | Status: DC

## 2017-12-23 MED ORDER — TAMSULOSIN HCL 0.4 MG PO CAPS: ORAL_CAPSULE | ORAL | 5 refills | Status: DC

## 2017-12-23 NOTE — Addendum Note (Signed)
Addended by: Pleas Koch on: 12/23/2017 01:00 PM   Modules accepted: Orders

## 2017-12-23 NOTE — Progress Notes (Addendum)
Subjective:    Patient ID: Christian Petty, male    DOB: 02-Jul-1956, 62 y.o.   MRN: [id number]  HPI  Mr. Oettinger is a 62 year old male who presents today for diabetes follow up.  Current medications include: Glipizide 10 mg BID, Lantus 40 units HS, Metformin 1000 mg BID  He is checking his blood glucose 1-2 times daily and is getting readings of: AM fasting 120's 1 hours after eating lunch: 150's Before Dinner: 170-180  Highest reading: 240 Lowest reading: 110  Last A1C: 8.6 in March 2019, 8.2 today. Last Eye Exam: March 2019 Last Foot Exam: March 2019 Pneumonia Vaccination: Completed in 2016 ACE/ARB: Losartan Statin: Pravastatin   Diet currently consists of:  Breakfast: Crackers Lunch: Salads Dinner: Crackers, hamburger steak, fast food (tried to make better choices) Snacks: None Desserts: Sugar free pudding, fruit cocktail Beverages: Gatorade Zero, milk, water  Exercise: He is not exercising       Review of Systems  Eyes: Negative for visual disturbance.  Respiratory: Negative for shortness of breath.   Cardiovascular: Negative for chest pain.  Neurological: Negative for dizziness and headaches.       Past Medical History:  Diagnosis Date  . BPH (benign prostatic hyperplasia)   . HTN (hypertension)    states under control with med., has been on med. x 2-3 yr.  . Hyperlipidemia, mild   . Insulin dependent diabetes mellitus ([place])   . Trigger thumb of left hand 10/2017     Social History   Socioeconomic History  . Marital status: Married    Spouse name: Not on file  . Number of children: 2  . Years of education: Not on file  . Highest education level: Not on file  Occupational History  . Occupation: truck Surveyor, quantity Needs  . Financial resource strain: Not on file  . Food insecurity:    Worry: Not on file    Inability: Not on file  . Transportation needs:    Medical: Not on file    Non-medical: Not on file  Tobacco Use  . Smoking status: Former  Smoker    Packs/day: 0.00    Last attempt to quit: 11/16/2010    Years since quitting: 7.1  . Smokeless tobacco: Never Used  Substance and Sexual Activity  . Alcohol use: Yes    Comment: occasionally  . Drug use: No  . Sexual activity: Not on file  Lifestyle  . Physical activity:    Days per week: Not on file    Minutes per session: Not on file  . Stress: Not on file  Relationships  . Social connections:    Talks on phone: Not on file    Gets together: Not on file    Attends religious service: Not on file    Active member of club or organization: Not on file    Attends meetings of clubs or organizations: Not on file    Relationship status: Not on file  . Intimate partner violence:    Fear of current or ex partner: Not on file    Emotionally abused: Not on file    Physically abused: Not on file    Forced sexual activity: Not on file  Other Topics Concern  . Not on file  Social History Narrative   Married.   2 children, 2 grandchildren.   Works for himself as a Administrator.    Past Surgical History:  Procedure Laterality Date  . COLONOSCOPY  06/19/2006  .  COLONOSCOPY WITH PROPOFOL  09/28/2016  . INGUINAL HERNIA REPAIR    . LUMBAR LAMINECTOMY     x 2  . TRIGGER FINGER RELEASE Right 10/04/2017   Procedure: RELEASE TRIGGER THUMB, RIGHT;  Surgeon: Marchia Bond, MD;  Location: [facility];  Service: Orthopedics;  Laterality: Right;    Family History  Problem Relation Age of Onset  . Diabetes Father   . Breast cancer Mother   . Stroke Unknown 53       F    No Known Allergies  Current Outpatient Medications on File Prior to Visit  Medication Sig Dispense Refill  . fenofibrate 160 MG tablet TAKE 1 TABLET BY MOUTH DAILY 90 tablet 1  . glipiZIDE (GLUCOTROL) 10 MG tablet Take 1 tablet (10 mg total) by mouth 2 (two) times daily before a meal. 180 tablet 3  . glucose blood (ONE TOUCH ULTRA TEST) test strip Use to test blood sugar 2 times daily 100 each 5    . Insulin Pen Needle (CAREFINE PEN NEEDLES) 31G X 8 MM MISC Use with insulin once nightly at bedtime. 90 each 3  . Lancets (ONETOUCH ULTRASOFT) lancets Use to test blood sugar 2 times daily 100 each 5  . losartan (COZAAR) 50 MG tablet TAKE 1 TABLET BY MOUTH DAILY 90 tablet 1  . metFORMIN (GLUCOPHAGE) 1000 MG tablet Take 1 tablet (1,000 mg total) by mouth 2 (two) times daily with a meal. 180 tablet 3  . pravastatin (PRAVACHOL) 40 MG tablet Take 1 tablet by mouth every evening for high cholesterol. 90 tablet 3  . tamsulosin (FLOMAX) 0.4 MG CAPS capsule TAKE ONE CAPSULE BY MOUTH DAILY AFTER BREAKFAST 30 capsule 5   No current facility-administered medications on file prior to visit.     BP 132/62   Pulse 92   Temp 98.2 F (36.8 C) (Oral)   Wt 206 lb 4 oz (93.6 kg)   SpO2 99%   BMI 28.77 kg/m    Objective:   Physical Exam  Constitutional: He appears well-nourished.  Neck: Neck supple.  Cardiovascular: Normal rate and regular rhythm.  Murmur heard. Respiratory: Effort normal and breath sounds normal.  Skin: Skin is warm and dry.           Assessment & Plan:  Murmur:  Noted on exam, louder over mitral valve region. He's never been told about a murmur before, he is asymptomatic. Will obtain echocardiogram given intensity of murmur.  Pleas Koch, NP

## 2017-12-23 NOTE — Assessment & Plan Note (Signed)
Improvement in A1C from 8.6 to 8.2.  Commended him on dietary changes, recommended regular exercise.  Will increase Lantus from 40 to 42 units. He will follow up in 3 months.   Managed on statin and ARB. Eye and foot exams UTD. Pneumonia vaccination UTD.

## 2017-12-23 NOTE — Patient Instructions (Signed)
We've increased your Lantus to 42 units every evening.  Continue Glipizide and Metformin.  Start exercising. You should be getting 150 minutes of moderate intensity exercise weekly.  Continue to work on [employer].  Please schedule a follow up appointment in 3 months.  It was a pleasure to see you today!

## 2017-12-23 NOTE — Telephone Encounter (Signed)
Patient states that while in for appointment today he did not get his Flomax refilled.  Discussed with Gentry Fitz, NP who states that patient did not request.  Verbal okay received from Gentry Fitz, NP okay to go ahead and send in refills X 34months.  Refills given per above orders.

## 2018-01-04 ENCOUNTER — Other Ambulatory Visit: Payer: Self-pay | Admitting: Primary Care

## 2018-01-04 DIAGNOSIS — Z794 Long term (current) use of insulin: Secondary | ICD-10-CM

## 2018-01-04 DIAGNOSIS — E118 Type 2 diabetes mellitus with unspecified complications: Secondary | ICD-10-CM

## 2018-01-06 ENCOUNTER — Other Ambulatory Visit: Payer: BLUE CROSS/BLUE SHIELD

## 2018-01-20 ENCOUNTER — Other Ambulatory Visit: Payer: Self-pay | Admitting: Primary Care

## 2018-01-20 DIAGNOSIS — E119 Type 2 diabetes mellitus without complications: Secondary | ICD-10-CM

## 2018-02-03 ENCOUNTER — Other Ambulatory Visit: Payer: BLUE CROSS/BLUE SHIELD

## 2018-02-03 DIAGNOSIS — R0989 Other specified symptoms and signs involving the circulatory and respiratory systems: Secondary | ICD-10-CM

## 2018-03-03 ENCOUNTER — Other Ambulatory Visit: Payer: Self-pay | Admitting: Primary Care

## 2018-03-03 DIAGNOSIS — E782 Mixed hyperlipidemia: Secondary | ICD-10-CM

## 2018-04-12 ENCOUNTER — Other Ambulatory Visit: Payer: Self-pay | Admitting: Primary Care

## 2018-04-12 DIAGNOSIS — E119 Type 2 diabetes mellitus without complications: Secondary | ICD-10-CM

## 2018-05-02 ENCOUNTER — Other Ambulatory Visit: Payer: Self-pay | Admitting: Primary Care

## 2018-06-12 ENCOUNTER — Other Ambulatory Visit: Payer: Self-pay | Admitting: Primary Care

## 2018-07-02 ENCOUNTER — Other Ambulatory Visit: Payer: Self-pay | Admitting: Primary Care

## 2018-07-02 DIAGNOSIS — Z794 Long term (current) use of insulin: Secondary | ICD-10-CM

## 2018-07-02 DIAGNOSIS — E118 Type 2 diabetes mellitus with unspecified complications: Secondary | ICD-10-CM

## 2018-07-10 ENCOUNTER — Other Ambulatory Visit: Payer: Self-pay | Admitting: Primary Care

## 2018-07-10 DIAGNOSIS — E119 Type 2 diabetes mellitus without complications: Secondary | ICD-10-CM

## 2018-08-12 ENCOUNTER — Other Ambulatory Visit: Payer: Self-pay | Admitting: Primary Care

## 2018-09-03 ENCOUNTER — Other Ambulatory Visit: Payer: Self-pay | Admitting: Primary Care

## 2018-09-03 DIAGNOSIS — E785 Hyperlipidemia, unspecified: Secondary | ICD-10-CM

## 2018-09-08 ENCOUNTER — Encounter: Payer: Self-pay | Admitting: Primary Care

## 2018-09-08 ENCOUNTER — Ambulatory Visit: Payer: BLUE CROSS/BLUE SHIELD | Admitting: Primary Care

## 2018-09-08 VITALS — BP 126/72 | HR 84 | Temp 98.1°F | Ht 71.0 in | Wt 209.0 lb

## 2018-09-08 DIAGNOSIS — N400 Enlarged prostate without lower urinary tract symptoms: Secondary | ICD-10-CM | POA: Diagnosis not present

## 2018-09-08 DIAGNOSIS — E1165 Type 2 diabetes mellitus with hyperglycemia: Secondary | ICD-10-CM

## 2018-09-08 DIAGNOSIS — E119 Type 2 diabetes mellitus without complications: Secondary | ICD-10-CM | POA: Diagnosis not present

## 2018-09-08 DIAGNOSIS — Z794 Long term (current) use of insulin: Secondary | ICD-10-CM

## 2018-09-08 DIAGNOSIS — E118 Type 2 diabetes mellitus with unspecified complications: Secondary | ICD-10-CM | POA: Diagnosis not present

## 2018-09-08 DIAGNOSIS — I10 Essential (primary) hypertension: Secondary | ICD-10-CM

## 2018-09-08 DIAGNOSIS — E785 Hyperlipidemia, unspecified: Secondary | ICD-10-CM

## 2018-09-08 DIAGNOSIS — R011 Cardiac murmur, unspecified: Secondary | ICD-10-CM | POA: Insufficient documentation

## 2018-09-08 LAB — COMPREHENSIVE METABOLIC PANEL
ALT: 24 U/L (ref 0–53)
AST: 16 U/L (ref 0–37)
Albumin: 4.6 g/dL (ref 3.5–5.2)
Alkaline Phosphatase: 31 U/L — ABNORMAL LOW (ref 39–117)
BUN: 16 mg/dL (ref 6–23)
CO2: 29 mEq/L (ref 19–32)
CREATININE: 1.2 mg/dL (ref 0.40–1.50)
Calcium: 9.9 mg/dL (ref 8.4–10.5)
Chloride: 101 mEq/L (ref 96–112)
GFR: 61.01 mL/min (ref >60.00)
Glucose, Bld: 119 mg/dL — ABNORMAL HIGH (ref 70–99)
Potassium: 4.4 mEq/L (ref 3.5–5.1)
Sodium: 139 mEq/L (ref 135–145)
Total Bilirubin: 0.7 mg/dL (ref 0.2–1.2)
Total Protein: 6.9 g/dL (ref 6.0–8.3)

## 2018-09-08 LAB — POCT GLYCOSYLATED HEMOGLOBIN (HGB A1C): Hemoglobin A1C: 7.8 % — AB (ref 4.0–5.6)

## 2018-09-08 LAB — LIPID PANEL
CHOLESTEROL: 158 mg/dL (ref 0–200)
HDL: 28.3 mg/dL — ABNORMAL LOW (ref >39.00)
NonHDL: 129.47
Total CHOL/HDL Ratio: 6
Triglycerides: 241 mg/dL — ABNORMAL HIGH (ref 0.0–149.0)
VLDL: 48.2 mg/dL — ABNORMAL HIGH (ref 0.0–40.0)

## 2018-09-08 LAB — LDL CHOLESTEROL, DIRECT: Direct LDL: 109 mg/dL

## 2018-09-08 LAB — PSA: PSA: 0.5 ng/mL (ref 0.10–4.00)

## 2018-09-08 MED ORDER — GLIPIZIDE 10 MG PO TABS: ORAL_TABLET | ORAL | 3 refills | Status: DC

## 2018-09-08 MED ORDER — PRAVASTATIN SODIUM 40 MG PO TABS: 40.0000 mg | ORAL_TABLET | Freq: Every day | ORAL | 3 refills | Status: DC

## 2018-09-08 MED ORDER — INSULIN GLARGINE 100 UNIT/ML SOLOSTAR PEN: PEN_INJECTOR | SUBCUTANEOUS | 3 refills | Status: DC

## 2018-09-08 MED ORDER — LOSARTAN POTASSIUM 100 MG PO TABS: 50.0000 mg | ORAL_TABLET | Freq: Every day | ORAL | 3 refills | Status: DC

## 2018-09-08 MED ORDER — INSULIN PEN NEEDLE 31G X 8 MM MISC: 3 refills | Status: DC

## 2018-09-08 MED ORDER — METFORMIN HCL 1000 MG PO TABS: ORAL_TABLET | ORAL | 3 refills | Status: DC

## 2018-09-08 MED ORDER — GLUCOSE BLOOD VI STRP: ORAL_STRIP | 5 refills | Status: DC

## 2018-09-08 NOTE — Assessment & Plan Note (Signed)
Doing well on Tamsulosin, continue same.

## 2018-09-08 NOTE — Assessment & Plan Note (Signed)
Repeat A1C today of 7.8 which is improved. Would like to see him below 7.5. Strongly advised he work on diet and start exercising. Discussed that it is imperative that he start checking his glucose levels twice daily, everyday. He verbalized understanding. Continue Lantus 42 units HS. Continue metformin and glipizide.  Foot exam today. Pneumonia vaccination UTD. Managed on statin and ARB.  Follow up in 6 months.

## 2018-09-08 NOTE — Assessment & Plan Note (Signed)
Repeat lipids due today. Continue fenofirbate and pravastatin. Continue to work on diet, start exercising.

## 2018-09-08 NOTE — Progress Notes (Signed)
Subjective:    Patient ID: Christian Petty, male    DOB: 07-11-1956, 63 y.o.   MRN: [id number]  HPI  Christian Petty is a 63 year old male who presents today for follow up.  1) Type 2 Diabetes: Current medications include: Metformin 1000 mg BID, Glipizide 10 mg BID, Lantus 42 units HS.  He is checking his blood glucose 2-3 times weekly and is getting readings of: AM fasting: 100-180 Bedtime: 100-180  Highest reading: 320, once Lowest reading: 90  Last A1C: 8.2 in June 2019, 7.8 today Last Eye Exam: Due this Spring, he will schedule Last Foot Exam: Due Pneumonia Vaccination: Completed in 2016 ACE/ARB: Losartan  Statin: pravastatin   Diet currently consists of:  Breakfast: Cereal, oatmeal cookies, crackers, eggs, sausage Lunch: Salad, mostly skips Dinner: Salad, crackers, sandwich  Snacks: None Desserts: Daily  Beverages: Some milk, unsweet tea, water, occasional diet soda  Exercise: he is not exercising   2) Hyperlipidemia: Currently managed on pravastatin and fenofibrate. He is not exercising.   3) BPH: Currently managed on Flomax and is doing well.   4) Essential Hypertension: Currently managed on losartan 50 mg, taking 1/2 tablet of 100 mg due to insurance. He denies chest pain, shortness of breath, cough.    BP Readings from Last 3 Encounters:  09/08/18 126/72  12/23/17 132/62  11/09/17 118/60     Review of Systems  Eyes: Negative for visual disturbance.  Respiratory: Negative for shortness of breath.   Cardiovascular: Negative for chest pain.  Neurological: Negative for dizziness and numbness.       Past Medical History:  Diagnosis Date  . BPH (benign prostatic hyperplasia)   . HTN (hypertension)    states under control with med., has been on med. x 2-3 yr.  . Hyperlipidemia, mild   . Insulin dependent diabetes mellitus ([place])   . Trigger thumb of left hand 10/2017     Social History   Socioeconomic History  . Marital status: Married    Spouse name: Not  on file  . Number of children: 2  . Years of education: Not on file  . Highest education level: Not on file  Occupational History  . Occupation: truck Surveyor, [employer]  . Financial resource strain: Not on file  . Food insecurity:    Worry: Not on file    Inability: Not on file  . Transportation needs:    Medical: Not on file    Non-medical: Not on file  Tobacco Use  . Smoking status: Former Smoker    Packs/day: 0.00    Last attempt to quit: 11/16/2010    Years since quitting: 7.8  . Smokeless tobacco: Never Used  Substance and Sexual Activity  . Alcohol use: Yes    Comment: occasionally  . Drug use: No  . Sexual activity: Not on file  Lifestyle  . Physical activity:    Days per week: Not on file    Minutes per session: Not on file  . Stress: Not on file  Relationships  . Social connections:    Talks on phone: Not on file    Gets together: Not on file    Attends religious service: Not on file    Active member of club or organization: Not on file    Attends meetings of clubs or organizations: Not on file    Relationship status: Not on file  . Intimate partner violence:    Fear of current or ex partner: Not on  file    Emotionally abused: Not on file    Physically abused: Not on file    Forced sexual activity: Not on file  Other Topics Concern  . Not on file  Social History Narrative   Married.   2 children, 2 grandchildren.   Works for himself as a Administrator.    Past Surgical History:  Procedure Laterality Date  . COLONOSCOPY  06/19/2006  . COLONOSCOPY WITH PROPOFOL  09/28/2016  . INGUINAL HERNIA REPAIR    . LUMBAR LAMINECTOMY     x 2  . TRIGGER FINGER RELEASE Right 10/04/2017   Procedure: RELEASE TRIGGER THUMB, RIGHT;  Surgeon: Marchia Bond, MD;  Location: [facility];  Service: Orthopedics;  Laterality: Right;    Family History  Problem Relation Age of Onset  . Diabetes Father   . Breast cancer Mother   . Stroke Unknown 40        F    No Known Allergies  Current Outpatient Medications on File Prior to Visit  Medication Sig Dispense Refill  . fenofibrate 160 MG tablet TAKE 1 TABLET BY MOUTH DAILY 90 tablet 2  . glipiZIDE (GLUCOTROL) 10 MG tablet TAKE 1 TABLET BY MOUTH TWICE A DAY BEFORE A MEAL 180 tablet 1  . glucose blood (ONE TOUCH ULTRA TEST) test strip Use to test blood sugar 2 times daily 100 each 5  . Insulin Glargine (LANTUS SOLOSTAR) 100 UNIT/ML Solostar Pen INJECT 30 UNITS INTO THE SKIN AT BEDTIME. Need follow up appointment. 15 mL 0  . Insulin Pen Needle (CAREFINE PEN NEEDLES) 31G X 8 MM MISC Use with insulin once nightly at bedtime. 90 each 3  . Lancets (ONETOUCH ULTRASOFT) lancets Use to test blood sugar 2 times daily 100 each 5  . losartan (COZAAR) 100 MG tablet Take 50 mg by mouth daily.    . metFORMIN (GLUCOPHAGE) 1000 MG tablet TAKE 1 TABLET BY MOUTH TWICE A DAY WITH A MEAL. 180 tablet 0  . pravastatin (PRAVACHOL) 40 MG tablet TAKE 1 TABLET BY MOUTH EVERY EVENING FOR HIGH CHOLESTEROL. 90 tablet 1  . tamsulosin (FLOMAX) 0.4 MG CAPS capsule TAKE ONE CAPSULE BY MOUTH DAILY AFTER BREAKFAST 90 capsule 1   No current facility-administered medications on file prior to visit.     BP 126/72   Pulse 84   Temp 98.1 F (36.7 C) (Oral)   Ht 5\' 11"  (1.803 m)   Wt 209 lb (94.8 kg)   SpO2 98%   BMI 29.15 kg/m    Objective:   Physical Exam  Cardiovascular: Normal rate and regular rhythm.  Murmur heard. Respiratory: Effort normal and breath sounds normal.  Skin: Skin is warm and dry.           Assessment & Plan:

## 2018-09-08 NOTE — Assessment & Plan Note (Signed)
Stable in the office today, continue losartan 50 mg.  BMP pending.

## 2018-09-08 NOTE — Assessment & Plan Note (Signed)
Has not yet scheduled echocardiogram that was ordered for June 2019, will re-order. No baseline echo on file.

## 2018-09-08 NOTE — Patient Instructions (Signed)
Stop by the lab prior to leaving today. I will notify you of your results once received.   Continue Metformin 1000 mg twice daily and Glipizide 10 mg twice daily for diabetes.  You MUST check your blood sugars at least twice daily when on insulin. Rotate times of checks: Before any meal, 2 hours after any meal, bedtime.  Notify me if you see numbers consistently above 200 or below 80.  Start exercising. You should be getting 150 minutes of moderate intensity exercise weekly.  It is important that you improve your diet. Please limit carbohydrates in the form of white bread, rice, pasta, sweets, fast food, fried food, sugary drinks, etc. Increase your consumption of fresh fruits and vegetables, whole grains, lean protein.  Ensure you are consuming 64 ounces of water daily.  Please schedule a follow up appointment in 6 months for diabetes check. It was a pleasure to see you today!   Diabetes Mellitus and Nutrition, Adult When you have diabetes (diabetes mellitus), it is very important to have healthy eating habits because your blood sugar (glucose) levels are greatly affected by what you eat and drink. Eating healthy foods in the appropriate amounts, at about the same times every day, can help you:  Control your blood glucose.  Lower your risk of heart disease.  Improve your blood pressure.  Reach or maintain a healthy weight. Every person with diabetes is different, and each person has different needs for a meal plan. Your health care provider may recommend that you work with a diet and nutrition specialist (dietitian) to make a meal plan that is best for you. Your meal plan may vary depending on factors such as:  The calories you need.  The medicines you take.  Your weight.  Your blood glucose, blood pressure, and cholesterol levels.  Your activity level.  Other health conditions you have, such as heart or kidney disease. How do carbohydrates affect me? Carbohydrates, also  called carbs, affect your blood glucose level more than any other type of food. Eating carbs naturally raises the amount of glucose in your blood. Carb counting is a method for keeping track of how many carbs you eat. Counting carbs is important to keep your blood glucose at a healthy level, especially if you use insulin or take certain oral diabetes medicines. It is important to know how many carbs you can safely have in each meal. This is different for every person. Your dietitian can help you calculate how many carbs you should have at each meal and for each snack. Foods that contain carbs include:  Bread, cereal, rice, pasta, and crackers.  Potatoes and corn.  Peas, beans, and lentils.  Milk and yogurt.  Fruit and juice.  Desserts, such as cakes, cookies, ice cream, and candy. How does alcohol affect me? Alcohol can cause a sudden decrease in blood glucose (hypoglycemia), especially if you use insulin or take certain oral diabetes medicines. Hypoglycemia can be a life-threatening condition. Symptoms of hypoglycemia (sleepiness, dizziness, and confusion) are similar to symptoms of having too much alcohol. If your health care provider says that alcohol is safe for you, follow these guidelines:  Limit alcohol intake to no more than 1 drink per day for nonpregnant women and 2 drinks per day for men. One drink equals 12 oz of beer, 5 oz of wine, or 1 oz of hard liquor.  Do not drink on an empty stomach.  Keep yourself hydrated with water, diet soda, or unsweetened iced tea.  Keep  in mind that regular soda, juice, and other mixers may contain a lot of sugar and must be counted as carbs. What are tips for following this plan?  Reading food labels  Start by checking the serving size on the "Nutrition Facts" label of packaged foods and drinks. The amount of calories, carbs, fats, and other nutrients listed on the label is based on one serving of the item. Many items contain more than one  serving per package.  Check the total grams (g) of carbs in one serving. You can calculate the number of servings of carbs in one serving by dividing the total carbs by 15. For example, if a food has 30 g of total carbs, it would be equal to 2 servings of carbs.  Check the number of grams (g) of saturated and trans fats in one serving. Choose foods that have low or no amount of these fats.  Check the number of milligrams (mg) of salt (sodium) in one serving. Most people should limit total sodium intake to less than 2,300 mg per day.  Always check the nutrition information of foods labeled as "low-fat" or "nonfat". These foods may be higher in added sugar or refined carbs and should be avoided.  Talk to your dietitian to identify your daily goals for nutrients listed on the label. Shopping  Avoid buying canned, premade, or processed foods. These foods tend to be high in fat, sodium, and added sugar.  Shop around the outside edge of the grocery store. This includes fresh fruits and vegetables, bulk grains, fresh meats, and fresh dairy. Cooking  Use low-heat cooking methods, such as baking, instead of high-heat cooking methods like deep frying.  Cook using healthy oils, such as olive, canola, or sunflower oil.  Avoid cooking with butter, cream, or high-fat meats. Meal planning  Eat meals and snacks regularly, preferably at the same times every day. Avoid going long periods of time without eating.  Eat foods high in fiber, such as fresh fruits, vegetables, beans, and whole grains. Talk to your dietitian about how many servings of carbs you can eat at each meal.  Eat 4-6 ounces (oz) of lean protein each day, such as lean meat, chicken, fish, eggs, or tofu. One oz of lean protein is equal to: ? 1 oz of meat, chicken, or fish. ? 1 egg. ?  cup of tofu.  Eat some foods each day that contain healthy fats, such as avocado, nuts, seeds, and fish. Lifestyle  Check your blood glucose  regularly.  Exercise regularly as told by your health care provider. This may include: ? 150 minutes of moderate-intensity or vigorous-intensity exercise each week. This could be brisk walking, biking, or water aerobics. ? Stretching and doing strength exercises, such as yoga or weightlifting, at least 2 times a week.  Take medicines as told by your health care provider.  Do not use any products that contain nicotine or tobacco, such as cigarettes and e-cigarettes. If you need help quitting, ask your health care provider.  Work with a Social worker or diabetes educator to identify strategies to manage stress and any emotional and social challenges. Questions to ask a health care provider  Do I need to meet with a diabetes educator?  Do I need to meet with a dietitian?  What number can I call if I have questions?  When are the best times to check my blood glucose? Where to find more information:  American Diabetes Association: diabetes.org  Academy of Nutrition and Dietetics: www.eatright.org  [employer] of Diabetes and Digestive and Kidney Diseases (NIH): [employer] Summary  A healthy meal plan will help you control your blood glucose and maintain a healthy lifestyle.  Working with a diet and nutrition specialist (dietitian) can help you make a meal plan that is best for you.  Keep in mind that carbohydrates (carbs) and alcohol have immediate effects on your blood glucose levels. It is important to count carbs and to use alcohol carefully. This information is not intended to replace advice given to you by your health care provider. Make sure you discuss any questions you have with your health care provider. Document Released: 04/01/2005 Document Revised: 02/02/2017 Document Reviewed: 08/09/2016 Elsevier Interactive Patient Education  2019 [employer].

## 2018-09-13 ENCOUNTER — Other Ambulatory Visit: Payer: Self-pay | Admitting: Primary Care

## 2018-09-13 DIAGNOSIS — E785 Hyperlipidemia, unspecified: Secondary | ICD-10-CM

## 2018-09-13 MED ORDER — ATORVASTATIN CALCIUM 40 MG PO TABS: 40.0000 mg | ORAL_TABLET | Freq: Every day | ORAL | 3 refills | Status: DC

## 2018-09-18 ENCOUNTER — Telehealth: Payer: Self-pay

## 2018-09-18 NOTE — Telephone Encounter (Signed)
Patient calls in confused over recent changes in his cholesterol medication.  He wanted to know if " he should be taking 2 cholesterol medications".   I reviewed his recent ov notes and lab notes.  Gentry Fitz, NP d/c'ed his pravastatin and switched him over to atorvastatin 40mg .  He is aware that he is not to take both of these and should discontinue the pravastatin.  Patient verbalizes understanding and has no further questions at this time.

## 2018-09-25 DIAGNOSIS — E119 Type 2 diabetes mellitus without complications: Secondary | ICD-10-CM | POA: Diagnosis not present

## 2018-09-25 DIAGNOSIS — H5203 Hypermetropia, bilateral: Secondary | ICD-10-CM | POA: Diagnosis not present

## 2018-09-25 DIAGNOSIS — H2513 Age-related nuclear cataract, bilateral: Secondary | ICD-10-CM | POA: Diagnosis not present

## 2018-09-25 DIAGNOSIS — H25013 Cortical age-related cataract, bilateral: Secondary | ICD-10-CM | POA: Diagnosis not present

## 2018-09-25 LAB — HM DIABETES EYE EXAM

## 2018-09-27 ENCOUNTER — Encounter: Payer: Self-pay | Admitting: Primary Care

## 2018-10-02 ENCOUNTER — Telehealth: Payer: Self-pay | Admitting: Primary Care

## 2018-10-02 MED ORDER — LANCETS MISC: 0 refills | Status: DC

## 2018-10-02 MED ORDER — CONTOUR NEXT TEST VI STRP: ORAL_STRIP | 2 refills | Status: DC

## 2018-10-02 NOTE — Telephone Encounter (Signed)
Pt walked in the office and stated he needs test strips for Contour next Ez for his sugar. He said the ones that were called in were for a One Touch but he doesn't have that. Please call him at [phone] once it is called in.

## 2018-10-06 ENCOUNTER — Other Ambulatory Visit: Payer: BLUE CROSS/BLUE SHIELD

## 2018-11-23 ENCOUNTER — Telehealth: Payer: Self-pay

## 2018-11-23 NOTE — Telephone Encounter (Signed)
I spoke with pt and scheduled virtual appt on 11/24/18 at 11 AM.

## 2018-11-23 NOTE — Telephone Encounter (Signed)
Patient will need a virtual visit for this. Will you please schedule for Friday May 8th? Christian Petty is out of the office.

## 2018-11-23 NOTE — Telephone Encounter (Signed)
Pt seen 09/08/18 for FU. Pt wants note that he needs massage therapy for his back pain due to arthritis. The massage therapist does not open until 12/13/18 but if pt has note needs massage therapy can get this weekend. Pt request cb.

## 2018-11-24 ENCOUNTER — Encounter: Payer: Self-pay | Admitting: Primary Care

## 2018-11-24 ENCOUNTER — Other Ambulatory Visit: Payer: Self-pay | Admitting: Primary Care

## 2018-11-24 ENCOUNTER — Ambulatory Visit (INDEPENDENT_AMBULATORY_CARE_PROVIDER_SITE_OTHER): Payer: BLUE CROSS/BLUE SHIELD | Admitting: Primary Care

## 2018-11-24 DIAGNOSIS — M549 Dorsalgia, unspecified: Secondary | ICD-10-CM | POA: Insufficient documentation

## 2018-11-24 DIAGNOSIS — M545 Low back pain, unspecified: Secondary | ICD-10-CM

## 2018-11-24 DIAGNOSIS — G8929 Other chronic pain: Secondary | ICD-10-CM

## 2018-11-24 NOTE — Patient Instructions (Signed)
Have your wife come pick up the letter in our office before 4 pm.  It was a pleasure to see you today! Allie Bossier, NP-C

## 2018-11-24 NOTE — Progress Notes (Signed)
Subjective:    Patient ID: Christian Petty, male    DOB: August 14, 1955, 63 y.o.   MRN: [id number]  HPI  Virtual Visit via Video Note  I connected with [name] on 11/24/18 at 11:00 AM EDT by a video enabled telemedicine application and verified that I am speaking with the correct person using two identifiers.  Location: Patient: Work Provider: Office   I discussed the limitations of evaluation and management by telemedicine and the availability of in person appointments. The patient expressed understanding and agreed to proceed.  We attempted to connect via video but the connection wasn't strong enough. Our video failed and we had to conduct our visit via phone.   History of Present Illness:  [name] is a 63 year old male with a history of type 2 diabetes, hypertension, and general arthralgias who presents today requesting a note for massage therapy.  He has a history of chronic back pain and has been seeing a massage therapist. His pain is located to the mid lumbar region that has been intermittent for years. He's undergone two lower back surgeries in the past. In order to manage his pain he sees a massage therapist once every two months which does very well for his pain management.  Earlier this week he did some increased activity in his yard which "flared up his back". His massage therapist was scheduled to open was on May 8th, but the state of [place] has extended his closure through May 27th. The patient is in a tremendous amount of pain and would really like to see his massage therapist. He may go see his therapist if he has a note from his provider stating that it is medically necessary. He's tried taking medications OTC without improvement.     Observations/Objective:  Alert and oriented. No distress. Speaking in complete sentences.   Assessment and Plan:  See problem based charting  Follow Up Instructions:  Have your wife come pick up the letter in our office before  4 pm.  It was a pleasure to see you today! Allie Bossier, NP-C    I discussed the assessment and treatment plan with the patient. The patient was provided an opportunity to ask questions and all were answered. The patient agreed with the plan and demonstrated an understanding of the instructions.   The patient was advised to call back or seek an in-person evaluation if the symptoms worsen or if the condition fails to improve as anticipated.    Pleas Koch, NP    Review of Systems  Genitourinary:       Denies loss of bowel/bladder control  Musculoskeletal: Positive for arthralgias and back pain.  Neurological: Negative for weakness and numbness.       Past Medical History:  Diagnosis Date  . BPH (benign prostatic hyperplasia)   . Chronic back pain   . HTN (hypertension)    states under control with med., has been on med. x 2-3 yr.  . Hyperlipidemia, mild   . Insulin dependent diabetes mellitus ([place])   . Trigger thumb of left hand 10/2017     Social History   Socioeconomic History  . Marital status: Married    Spouse name: Not on file  . Number of children: 2  . Years of education: Not on file  . Highest education level: Not on file  Occupational History  . Occupation: truck Surveyor, [employer]  . Financial resource strain: Not on file  . Food insecurity:  Worry: Not on file    Inability: Not on file  . Transportation needs:    Medical: Not on file    Non-medical: Not on file  Tobacco Use  . Smoking status: Former Smoker    Packs/day: 0.00    Last attempt to quit: 11/16/2010    Years since quitting: 8.0  . Smokeless tobacco: Never Used  Substance and Sexual Activity  . Alcohol use: Yes    Comment: occasionally  . Drug use: No  . Sexual activity: Not on file  Lifestyle  . Physical activity:    Days per week: Not on file    Minutes per session: Not on file  . Stress: Not on file  Relationships  . Social connections:    Talks on phone: Not on  file    Gets together: Not on file    Attends religious service: Not on file    Active member of club or organization: Not on file    Attends meetings of clubs or organizations: Not on file    Relationship status: Not on file  . Intimate partner violence:    Fear of current or ex partner: Not on file    Emotionally abused: Not on file    Physically abused: Not on file    Forced sexual activity: Not on file  Other Topics Concern  . Not on file  Social History Narrative   Married.   2 children, 2 grandchildren.   Works for himself as a Administrator.    Past Surgical History:  Procedure Laterality Date  . COLONOSCOPY  06/19/2006  . COLONOSCOPY WITH PROPOFOL  09/28/2016  . INGUINAL HERNIA REPAIR    . LUMBAR LAMINECTOMY     x 2  . TRIGGER FINGER RELEASE Right 10/04/2017   Procedure: RELEASE TRIGGER THUMB, RIGHT;  Surgeon: Marchia Bond, MD;  Location: [facility];  Service: Orthopedics;  Laterality: Right;    Family History  Problem Relation Age of Onset  . Diabetes Father   . Breast cancer Mother   . Stroke Unknown 42       F    No Known Allergies  Current Outpatient Medications on File Prior to Visit  Medication Sig Dispense Refill  . atorvastatin (LIPITOR) 40 MG tablet Take 1 tablet (40 mg total) by mouth daily. For cholesterol. 90 tablet 3  . CONTOUR NEXT TEST test strip Use to test blood sugar 2 times daily 100 each 2  . fenofibrate 160 MG tablet TAKE 1 TABLET BY MOUTH DAILY 90 tablet 2  . glipiZIDE (GLUCOTROL) 10 MG tablet TAKE 1 TABLET BY MOUTH TWICE A DAY BEFORE A MEAL for diabetes. 180 tablet 3  . Insulin Glargine (LANTUS SOLOSTAR) 100 UNIT/ML Solostar Pen INJECT 42 UNITS INTO THE SKIN AT BEDTIME for diabetes. 15 mL 3  . Insulin Pen Needle (CAREFINE PEN NEEDLES) 31G X 8 MM MISC Use with insulin once nightly at bedtime. 100 each 3  . Lancets MISC Use as instructed to test blood sugar daily 100 each 0  . losartan (COZAAR) 100 MG tablet Take 0.5 tablets  (50 mg total) by mouth daily. For blood pressure. 45 tablet 3  . metFORMIN (GLUCOPHAGE) 1000 MG tablet TAKE 1 TABLET BY MOUTH TWICE A DAY WITH A MEAL for diabetes. 180 tablet 3  . tamsulosin (FLOMAX) 0.4 MG CAPS capsule TAKE ONE CAPSULE BY MOUTH DAILY AFTER BREAKFAST 90 capsule 1   No current facility-administered medications on file prior to visit.  There were no vitals taken for this visit.   Objective:   Physical Exam  Constitutional: He is oriented to person, place, and time. He appears well-nourished.  Neurological: He is alert and oriented to person, place, and time.  Psychiatric: He has a normal mood and affect.           Assessment & Plan:

## 2018-11-24 NOTE — Assessment & Plan Note (Signed)
Chronic and intermittent for nearly 30 years.  If massage therapy is the most effective treatment for his intermittent flares then I am very much agreeable to allowing him to see his therapist. We most certainly wouldn't want to initiate controlled substances/narcotics for his pain. The best treatment is non pharmacological therapy.  Note provided.

## 2018-11-30 ENCOUNTER — Other Ambulatory Visit: Payer: Self-pay | Admitting: Primary Care

## 2018-11-30 DIAGNOSIS — E782 Mixed hyperlipidemia: Secondary | ICD-10-CM

## 2018-12-05 ENCOUNTER — Other Ambulatory Visit: Payer: Self-pay | Admitting: Primary Care

## 2019-04-02 ENCOUNTER — Other Ambulatory Visit: Payer: Self-pay | Admitting: Primary Care

## 2019-05-03 ENCOUNTER — Other Ambulatory Visit: Payer: Self-pay | Admitting: Primary Care

## 2019-05-03 DIAGNOSIS — E119 Type 2 diabetes mellitus without complications: Secondary | ICD-10-CM

## 2019-06-05 ENCOUNTER — Other Ambulatory Visit: Payer: Self-pay | Admitting: Primary Care

## 2019-06-05 DIAGNOSIS — E782 Mixed hyperlipidemia: Secondary | ICD-10-CM

## 2019-06-05 NOTE — Telephone Encounter (Signed)
Please call patient and schedule a diabetic follow-up appointment. Send back to CMA for refill after the appointment is scheduled.

## 2019-06-06 NOTE — Telephone Encounter (Signed)
Patient called back.  He scheduled follow up visit for tomorrow @ 11:20

## 2019-06-06 NOTE — Telephone Encounter (Signed)
Lvm asking pt to call office 

## 2019-06-07 ENCOUNTER — Ambulatory Visit (INDEPENDENT_AMBULATORY_CARE_PROVIDER_SITE_OTHER): Payer: BC Managed Care – PPO | Admitting: Primary Care

## 2019-06-07 ENCOUNTER — Other Ambulatory Visit: Payer: Self-pay

## 2019-06-07 VITALS — BP 130/84 | HR 99 | Temp 96.8°F | Ht 71.0 in | Wt 216.2 lb

## 2019-06-07 DIAGNOSIS — E782 Mixed hyperlipidemia: Secondary | ICD-10-CM | POA: Diagnosis not present

## 2019-06-07 DIAGNOSIS — E119 Type 2 diabetes mellitus without complications: Secondary | ICD-10-CM | POA: Diagnosis not present

## 2019-06-07 DIAGNOSIS — E1165 Type 2 diabetes mellitus with hyperglycemia: Secondary | ICD-10-CM | POA: Diagnosis not present

## 2019-06-07 DIAGNOSIS — N400 Enlarged prostate without lower urinary tract symptoms: Secondary | ICD-10-CM

## 2019-06-07 DIAGNOSIS — E785 Hyperlipidemia, unspecified: Secondary | ICD-10-CM | POA: Diagnosis not present

## 2019-06-07 LAB — LIPID PANEL
Cholesterol: 149 mg/dL (ref 0–200)
HDL: 24.5 mg/dL — ABNORMAL LOW
NonHDL: 124.75
Total CHOL/HDL Ratio: 6
Triglycerides: 233 mg/dL — ABNORMAL HIGH (ref 0.0–149.0)
VLDL: 46.6 mg/dL — ABNORMAL HIGH (ref 0.0–40.0)

## 2019-06-07 LAB — COMPREHENSIVE METABOLIC PANEL
ALT: 24 U/L (ref 0–53)
AST: 13 U/L (ref 0–37)
Albumin: 4.4 g/dL (ref 3.5–5.2)
Alkaline Phosphatase: 31 U/L — ABNORMAL LOW (ref 39–117)
BUN: 16 mg/dL (ref 6–23)
CO2: 25 mEq/L (ref 19–32)
Calcium: 10.3 mg/dL (ref 8.4–10.5)
Chloride: 102 mEq/L (ref 96–112)
Creatinine, Ser: 1.04 mg/dL (ref 0.40–1.50)
GFR: 71.8 mL/min (ref >60.00)
Glucose, Bld: 221 mg/dL — ABNORMAL HIGH (ref 70–99)
Potassium: 4.1 mEq/L (ref 3.5–5.1)
Sodium: 136 mEq/L (ref 135–145)
Total Bilirubin: 0.5 mg/dL (ref 0.2–1.2)
Total Protein: 6.8 g/dL (ref 6.0–8.3)

## 2019-06-07 LAB — POCT GLYCOSYLATED HEMOGLOBIN (HGB A1C): Hemoglobin A1C: 8.4 % — AB (ref 4.0–5.6)

## 2019-06-07 LAB — LDL CHOLESTEROL, DIRECT: Direct LDL: 100 mg/dL

## 2019-06-07 MED ORDER — TAMSULOSIN HCL 0.4 MG PO CAPS: 0.4000 mg | ORAL_CAPSULE | Freq: Every day | ORAL | 3 refills | Status: DC

## 2019-06-07 MED ORDER — ATORVASTATIN CALCIUM 40 MG PO TABS: 40.0000 mg | ORAL_TABLET | Freq: Every day | ORAL | 3 refills | Status: DC

## 2019-06-07 MED ORDER — LANTUS SOLOSTAR 100 UNIT/ML ~~LOC~~ SOPN: PEN_INJECTOR | SUBCUTANEOUS | 2 refills | Status: DC

## 2019-06-07 MED ORDER — FENOFIBRATE 160 MG PO TABS: 160.0000 mg | ORAL_TABLET | Freq: Every day | ORAL | 3 refills | Status: DC

## 2019-06-07 MED ORDER — GLIPIZIDE 10 MG PO TABS: ORAL_TABLET | ORAL | 3 refills | Status: DC

## 2019-06-07 NOTE — Assessment & Plan Note (Signed)
Doing well on tamsulosin, refills provided.

## 2019-06-07 NOTE — Patient Instructions (Signed)
We've increased your Lantus to 45 units nightly.  Start checking your blood sugar levels.  Appropriate times to check your blood sugar levels are:  -Before any meal (breakfast, lunch, dinner) -Two hours after any meal (breakfast, lunch, dinner) -Bedtime  Record your readings and notify me if you continue to consistently run at or above 200   Stop by the lab prior to leaving today. I will notify you of your results once received.   Start exercising. You should be getting 150 minutes of moderate intensity exercise weekly.  It is important that you improve your diet. Please limit carbohydrates in the form of white bread, rice, pasta, sweets, fast food, fried food, sugary drinks, etc. Increase your consumption of fresh fruits and vegetables, whole grains, lean protein.  Ensure you are consuming 64 ounces of water daily.  Please schedule a physical with me for March 2021.   It was a pleasure to see you today!

## 2019-06-07 NOTE — Assessment & Plan Note (Signed)
Repeat lipids pending, now on atorvastatin.

## 2019-06-07 NOTE — Assessment & Plan Note (Signed)
Increase in A1C to 8.4 which is likely diet related.  Strongly advised he start checking his glucose readings at least 1-2 times daily, he verbalized understanding.  Increase Lantus to 45 units. Continue Metformin and Glipizide.  Managed on statin and ARB. Eye and foot exams UTD. Pneumonia vaccination due in 2021.  Follow up in 3-4 months.

## 2019-06-07 NOTE — Progress Notes (Signed)
Subjective:    Patient ID: Christian Petty, male    DOB: March 25, 1956, 63 y.o.   MRN: [id number]  HPI  Christian Petty is a 63 year old male is a 63 year old male who presents today for follow up and medication refills.  1) Type 2 Diabetes:   Current medications include: Glipizide 10 mg BID, Metformin 1000 mg BID, Lantus 42 units.   He is checking his blood glucose 1-2 times weekly and is getting readings of:  Before lunch: 160's-200;s  Lowest reading: 115 Highest reading: 205  Last A1C: 7.8 in February 2021, 8.4 today Last Eye Exam: Due in March 2021 Last Foot Exam: Due in February 2021 Pneumonia Vaccination: Completed in 2016 ACE/ARB: Losartan Statin: atorvastatin   He endorses a poor diet with quick meals, sandwiches, canned food, microwaved food, daily desserts, drinking unsweet tea, water.   He is not exercising.   BP Readings from Last 3 Encounters:  06/07/19 130/84  09/08/18 126/72  12/23/17 132/62      Review of Systems  Constitutional: Negative for fatigue.  Respiratory: Negative for shortness of breath.   Cardiovascular: Negative for chest pain.  Neurological: Negative for dizziness and headaches.       Past Medical History:  Diagnosis Date  . BPH (benign prostatic hyperplasia)   . Chronic back pain   . HTN (hypertension)    states under control with med., has been on med. x 2-3 yr.  . Hyperlipidemia, mild   . Insulin dependent diabetes mellitus ([place])   . Trigger thumb of left hand 10/2017     Social History   Socioeconomic History  . Marital status: Married    Spouse name: Not on file  . Number of children: 2  . Years of education: Not on file  . Highest education level: Not on file  Occupational History  . Occupation: truck Surveyor, quantity Needs  . Financial resource strain: Not on file  . Food insecurity    Worry: Not on file    Inability: Not on file  . Transportation needs    Medical: Not on file    Non-medical: Not on file  Tobacco Use  .  Smoking status: Former Smoker    Packs/day: 0.00    Quit date: 11/16/2010    Years since quitting: 8.5  . Smokeless tobacco: Never Used  Substance and Sexual Activity  . Alcohol use: Yes    Comment: occasionally  . Drug use: No  . Sexual activity: Not on file  Lifestyle  . Physical activity    Days per week: Not on file    Minutes per session: Not on file  . Stress: Not on file  Relationships  . Social Herbalist on phone: Not on file    Gets together: Not on file    Attends religious service: Not on file    Active member of club or organization: Not on file    Attends meetings of clubs or organizations: Not on file    Relationship status: Not on file  . Intimate partner violence    Fear of current or ex partner: Not on file    Emotionally abused: Not on file    Physically abused: Not on file    Forced sexual activity: Not on file  Other Topics Concern  . Not on file  Social History Narrative   Married.   2 children, 2 grandchildren.   Works for himself as a Administrator.  Past Surgical History:  Procedure Laterality Date  . COLONOSCOPY  06/19/2006  . COLONOSCOPY WITH PROPOFOL  09/28/2016  . INGUINAL HERNIA REPAIR    . LUMBAR LAMINECTOMY     x 2  . TRIGGER FINGER RELEASE Right 10/04/2017   Procedure: RELEASE TRIGGER THUMB, RIGHT;  Surgeon: Marchia Bond, MD;  Location: [facility];  Service: Orthopedics;  Laterality: Right;    Family History  Problem Relation Age of Onset  . Diabetes Father   . Breast cancer Mother   . Stroke Unknown 61       F    No Known Allergies  Current Outpatient Medications on File Prior to Visit  Medication Sig Dispense Refill  . CONTOUR NEXT TEST test strip USE TO TEST BLOOD SUGAR 2 TIMES DAILY 100 strip 2  . Insulin Pen Needle (CAREFINE PEN NEEDLES) 31G X 8 MM MISC Use with insulin once nightly at bedtime. 100 each 3  . Lancets MISC Use as instructed to test blood sugar daily 100 each 0  . losartan  (COZAAR) 100 MG tablet Take 0.5 tablets (50 mg total) by mouth daily. For blood pressure. 45 tablet 3  . metFORMIN (GLUCOPHAGE) 1000 MG tablet TAKE 1 TABLET BY MOUTH TWICE A DAY WITH A MEAL for diabetes. 180 tablet 3   No current facility-administered medications on file prior to visit.     BP 130/84   Pulse 99   Temp (!) 96.8 F (36 C) (Temporal)   Ht 5\' 11"  (1.803 m)   Wt 216 lb 4 oz (98.1 kg)   SpO2 98%   BMI 30.16 kg/m    Objective:   Physical Exam  Constitutional: He appears well-nourished.  Neck: Neck supple.  Cardiovascular: Normal rate and regular rhythm.  Respiratory: Effort normal and breath sounds normal.  Skin: Skin is warm and dry.  Psychiatric: He has a normal mood and affect.           Assessment & Plan:

## 2019-07-16 ENCOUNTER — Other Ambulatory Visit: Payer: Self-pay | Admitting: Primary Care

## 2019-07-16 DIAGNOSIS — I10 Essential (primary) hypertension: Secondary | ICD-10-CM

## 2019-07-23 ENCOUNTER — Telehealth: Payer: Self-pay | Admitting: *Deleted

## 2019-07-23 DIAGNOSIS — I10 Essential (primary) hypertension: Secondary | ICD-10-CM

## 2019-07-23 NOTE — Telephone Encounter (Signed)
Patient called stating that there is some confusion with his Losartan prescription. Patient stated that he has a week's worth left. Patient stated that the pharmacist told him that he can not get it refilled until 08/16/19. Patient stated that he has been taking a whole pill instead of a 1/2 daily. Patient wants to clarify how he is to take the Losartan and he needs to get a refill because he will out in a week. Pharmacy [facility]

## 2019-07-24 MED ORDER — LOSARTAN POTASSIUM 100 MG PO TABS: 100.0000 mg | ORAL_TABLET | Freq: Every day | ORAL | 1 refills | Status: DC

## 2019-07-24 NOTE — Telephone Encounter (Signed)
How long has he been taking the full pill of losartan? If he was taking the full pill during his follow-up visit in November 2020 then okay to send new prescription for losartan 100 mg daily.  He is due for diabetes follow-up on or after September 07, 2019, please schedule.

## 2019-07-24 NOTE — Telephone Encounter (Signed)
Spoken and notified patient's wife of Tawni Millers comments. Patient's wife stated that he always have taken the full tablet, wife stated he more likely did not read the instructions.  [facility]

## 2019-08-07 ENCOUNTER — Encounter: Payer: Self-pay | Admitting: Gastroenterology

## 2019-09-04 ENCOUNTER — Telehealth: Payer: Self-pay | Admitting: Primary Care

## 2019-09-04 NOTE — Telephone Encounter (Signed)
Patient is scheduled to have his covid vaccine done on Friday 2/19 Patient wanted to make sure that it was okay for him to have this vaccine done with all the medications he is taking  Please advise

## 2019-09-04 NOTE — Telephone Encounter (Signed)
Yes, okay to proceed with Covid-19 vaccination series.

## 2019-09-05 ENCOUNTER — Other Ambulatory Visit: Payer: Self-pay | Admitting: Primary Care

## 2019-09-05 DIAGNOSIS — E118 Type 2 diabetes mellitus with unspecified complications: Secondary | ICD-10-CM

## 2019-09-05 DIAGNOSIS — Z794 Long term (current) use of insulin: Secondary | ICD-10-CM

## 2019-09-05 NOTE — Telephone Encounter (Signed)
Spoken and notified patient of Kate Clark's comments. Patient verbalized understanding.  

## 2019-09-07 ENCOUNTER — Ambulatory Visit: Payer: BC Managed Care – PPO

## 2019-09-09 ENCOUNTER — Ambulatory Visit: Payer: BC Managed Care – PPO | Attending: Internal Medicine

## 2019-09-09 DIAGNOSIS — Z23 Encounter for immunization: Secondary | ICD-10-CM | POA: Insufficient documentation

## 2019-09-09 NOTE — Progress Notes (Signed)
   Covid-19 Vaccination Clinic  Name:  Christian Petty    MRN: [id number] DOB: [birth year]  09/09/2019  [name] was observed post Covid-19 immunization for 15 minutes without incidence. He was provided with Vaccine Information Sheet and instruction to access the V-Safe system.   [name] was instructed to call 911 with any severe reactions post vaccine: Marland Kitchen Difficulty breathing  . Swelling of your face and throat  . A fast heartbeat  . A bad rash all over your body  . Dizziness and weakness    Immunizations Administered    Name Date Dose VIS Date Route   Pfizer COVID-19 Vaccine 09/09/2019  8:56 AM 0.3 mL 06/29/2019 Intramuscular   Manufacturer: [place]   Lot: [id number]   [place]: [id number]

## 2019-09-24 ENCOUNTER — Telehealth: Payer: Self-pay | Admitting: Primary Care

## 2019-09-24 NOTE — Telephone Encounter (Signed)
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 Patient is requesting form be filled out by 09/26/19.  Form is in rx tower.

## 2019-09-24 NOTE — Telephone Encounter (Signed)
Placed form in Kate's inbox 

## 2019-09-25 NOTE — Telephone Encounter (Signed)
Completed and placed in Chans inbox. 

## 2019-09-26 NOTE — Telephone Encounter (Signed)
Spoken to patient this morning and notified that paperwork is ready for pick up. Left in the front office.

## 2019-10-02 ENCOUNTER — Ambulatory Visit: Payer: BC Managed Care – PPO | Attending: Internal Medicine

## 2019-10-02 DIAGNOSIS — Z23 Encounter for immunization: Secondary | ICD-10-CM

## 2019-10-02 NOTE — Progress Notes (Signed)
   Covid-19 Vaccination Clinic  Name:  Christian Petty    MRN: [id number] DOB: [birth year]  10/02/2019  Christian Petty was observed post Covid-19 immunization for 15 minutes without incident. He was provided with Vaccine Information Sheet and instruction to access the V-Safe system.   Christian Petty was instructed to call 911 with any severe reactions post vaccine: Marland Kitchen Difficulty breathing  . Swelling of face and throat  . A fast heartbeat  . A bad rash all over body  . Dizziness and weakness   Immunizations Administered    Name Date Dose VIS Date Route   Pfizer COVID-19 Vaccine 10/02/2019  2:36 PM 0.3 mL 06/29/2019 Intramuscular   Manufacturer: [place]   Lot: [id number]   [place]: [id number]

## 2019-10-04 DIAGNOSIS — H53001 Unspecified amblyopia, right eye: Secondary | ICD-10-CM | POA: Diagnosis not present

## 2019-10-04 DIAGNOSIS — E119 Type 2 diabetes mellitus without complications: Secondary | ICD-10-CM | POA: Diagnosis not present

## 2019-10-04 DIAGNOSIS — H2513 Age-related nuclear cataract, bilateral: Secondary | ICD-10-CM | POA: Diagnosis not present

## 2019-10-04 DIAGNOSIS — H5203 Hypermetropia, bilateral: Secondary | ICD-10-CM | POA: Diagnosis not present

## 2019-10-04 LAB — HM DIABETES EYE EXAM

## 2019-10-10 ENCOUNTER — Encounter: Payer: Self-pay | Admitting: Primary Care

## 2019-10-29 ENCOUNTER — Other Ambulatory Visit: Payer: Self-pay

## 2019-10-29 ENCOUNTER — Ambulatory Visit (INDEPENDENT_AMBULATORY_CARE_PROVIDER_SITE_OTHER): Payer: BC Managed Care – PPO | Admitting: Primary Care

## 2019-10-29 ENCOUNTER — Encounter: Payer: Self-pay | Admitting: Primary Care

## 2019-10-29 VITALS — BP 126/82 | HR 89 | Temp 97.1°F | Ht 71.0 in | Wt 207.0 lb

## 2019-10-29 DIAGNOSIS — E1165 Type 2 diabetes mellitus with hyperglycemia: Secondary | ICD-10-CM

## 2019-10-29 DIAGNOSIS — E119 Type 2 diabetes mellitus without complications: Secondary | ICD-10-CM | POA: Diagnosis not present

## 2019-10-29 LAB — POCT GLYCOSYLATED HEMOGLOBIN (HGB A1C): Hemoglobin A1C: 7.7 % — AB (ref 4.0–5.6)

## 2019-10-29 MED ORDER — CAREFINE PEN NEEDLES 31G X 8 MM MISC: 3 refills | Status: DC

## 2019-10-29 NOTE — Progress Notes (Signed)
Subjective:    Patient ID: Christian Petty, male    DOB: 1956-03-25, 64 y.o.   MRN: [id number]  HPI  This visit occurred during the SARS-CoV-2 public health emergency.  Safety protocols were in place, including screening questions prior to the visit, additional usage of staff PPE, and extensive cleaning of exam room while observing appropriate contact time as indicated for disinfecting solutions.   Christian Petty is a 64 year old male with a history of hypertension, type 2 diabetes, BPH, tobacco abuse, hyperlipidemia, chronic back pain who presents today for diabetes follow up.  Current medications include: Glipizide 10 mg BID, Lantus 45 units HS, Metformin 1000 mg BID.  He is checking his blood glucose 1-2 times daily and is getting readings of:  AM fasting: 130 Before dinner: 180's-190's  Last A1C: 8.4 in November 2020, 7.7 today Last Eye Exam: Completed in March 2021 Last Foot Exam: Due today Pneumonia Vaccination: Completed in 2016 ACE/ARB: Losartan  Statin: Lipitor   BP Readings from Last 3 Encounters:  10/29/19 126/82  06/07/19 130/84  09/08/18 126/72   Wt Readings from Last 3 Encounters:  10/29/19 207 lb (93.9 kg)  06/07/19 216 lb 4 oz (98.1 kg)  09/08/18 209 lb (94.8 kg)      Review of Systems  Eyes: Negative for visual disturbance.  Respiratory: Negative for shortness of breath.   Cardiovascular: Negative for chest pain.  Neurological: Negative for dizziness and headaches.       Past Medical History:  Diagnosis Date  . BPH (benign prostatic hyperplasia)   . Chronic back pain   . HTN (hypertension)    states under control with med., has been on med. x 2-3 yr.  . Hyperlipidemia, mild   . Insulin dependent diabetes mellitus   . Trigger thumb of left hand 10/2017     Social History   Socioeconomic History  . Marital status: Married    Spouse name: Not on file  . Number of children: 2  . Years of education: Not on file  . Highest education level: Not on file    Occupational History  . Occupation: truck Geophysicist/field seismologist   Tobacco Use  . Smoking status: Former Smoker    Packs/day: 0.00    Quit date: 11/16/2010    Years since quitting: 8.9  . Smokeless tobacco: Never Used  Substance and Sexual Activity  . Alcohol use: Yes    Comment: occasionally  . Drug use: No  . Sexual activity: Not on file  Other Topics Concern  . Not on file  Social History Narrative   Married.   2 children, 2 grandchildren.   Works for himself as a Administrator.   Social Determinants of Health   Financial Resource Strain:   . Difficulty of Paying Living Expenses:   Food Insecurity:   . Worried About Charity fundraiser in the Last Year:   . Arboriculturist in the Last Year:   Transportation Needs:   . Film/video editor (Medical):   Marland Kitchen Lack of Transportation (Non-Medical):   Physical Activity:   . Days of Exercise per Week:   . Minutes of Exercise per Session:   Stress:   . Feeling of Stress :   Social Connections:   . Frequency of Communication with Friends and Family:   . Frequency of Social Gatherings with Friends and Family:   . Attends Religious Services:   . Active Member of Clubs or Organizations:   . Attends Club or  Organization Meetings:   Marland Kitchen Marital Status:   Intimate Partner Violence:   . Fear of Current or Ex-Partner:   . Emotionally Abused:   Marland Kitchen Physically Abused:   . Sexually Abused:     Past Surgical History:  Procedure Laterality Date  . COLONOSCOPY  06/19/2006  . COLONOSCOPY WITH PROPOFOL  09/28/2016  . INGUINAL HERNIA REPAIR    . LUMBAR LAMINECTOMY     x 2  . TRIGGER FINGER RELEASE Right 10/04/2017   Procedure: RELEASE TRIGGER THUMB, RIGHT;  Surgeon: Marchia Bond, MD;  Location: [facility];  Service: Orthopedics;  Laterality: Right;    Family History  Problem Relation Age of Onset  . Diabetes Father   . Breast cancer Mother   . Stroke Other 70       F    No Known Allergies  Current Outpatient Medications on  File Prior to Visit  Medication Sig Dispense Refill  . atorvastatin (LIPITOR) 40 MG tablet Take 1 tablet (40 mg total) by mouth daily. For cholesterol. 90 tablet 3  . CONTOUR NEXT TEST test strip USE TO TEST BLOOD SUGAR 2 TIMES DAILY 100 strip 2  . fenofibrate 160 MG tablet Take 1 tablet (160 mg total) by mouth daily. 90 tablet 3  . glipiZIDE (GLUCOTROL) 10 MG tablet TAKE 1 TABLET BY MOUTH TWICE A DAY BEFORE A MEAL for diabetes. 180 tablet 3  . Insulin Glargine (LANTUS SOLOSTAR) 100 UNIT/ML Solostar Pen INJECT 45 UNITS INTO THE SKIN AT BEDTIME FOR DIABETES. 15 mL 2  . Insulin Pen Needle (CAREFINE PEN NEEDLES) 31G X 8 MM MISC Use with insulin once nightly at bedtime. 100 each 3  . Lancets MISC Use as instructed to test blood sugar daily 100 each 0  . losartan (COZAAR) 100 MG tablet Take 1 tablet (100 mg total) by mouth daily. For blood pressure. 90 tablet 1  . metFORMIN (GLUCOPHAGE) 1000 MG tablet TAKE 1 TABLET BY MOUTH TWICE A DAY WITH A MEAL FOR DIABETES. 180 tablet 1  . tamsulosin (FLOMAX) 0.4 MG CAPS capsule Take 1 capsule (0.4 mg total) by mouth daily after breakfast. 90 capsule 3   No current facility-administered medications on file prior to visit.    BP 126/82   Pulse 89   Temp (!) 97.1 F (36.2 C) (Temporal)   Ht 5\' 11"  (1.803 m)   Wt 207 lb (93.9 kg)   SpO2 98%   BMI 28.87 kg/m    Objective:   Physical Exam  Constitutional: He appears well-nourished.  Cardiovascular: Normal rate and regular rhythm.  Respiratory: Effort normal and breath sounds normal.  Musculoskeletal:     Cervical back: Neck supple.  Skin: Skin is warm and dry.  Psychiatric: He has a normal mood and affect.           Assessment & Plan:

## 2019-10-29 NOTE — Addendum Note (Signed)
Addended by: Jacqualin Combes on: 10/29/2019 11:16 AM   Modules accepted: Orders

## 2019-10-29 NOTE — Assessment & Plan Note (Signed)
Improved today with A1C of 7.7.  Commended him on dietary changes, encouraged to continue. Encouraged regular exercise.   Foot exam today. Managed on statin and ARB. Eye exam UTD.  Prevnar 13 due next year.  Follow up in 6 months.

## 2019-10-29 NOTE — Patient Instructions (Signed)
Continue to work on a healthy diet.   Start exercising. You should be getting 150 minutes of moderate intensity exercise weekly.  Continue all medications as prescribed.  Please schedule a follow up appointment in 6 months.   It was a pleasure to see you today!

## 2019-11-25 ENCOUNTER — Other Ambulatory Visit: Payer: Self-pay | Admitting: Primary Care

## 2019-11-25 DIAGNOSIS — I10 Essential (primary) hypertension: Secondary | ICD-10-CM

## 2019-12-11 ENCOUNTER — Telehealth: Payer: Self-pay | Admitting: Primary Care

## 2019-12-11 DIAGNOSIS — E119 Type 2 diabetes mellitus without complications: Secondary | ICD-10-CM

## 2020-01-24 ENCOUNTER — Telehealth: Payer: Self-pay

## 2020-01-24 NOTE — Telephone Encounter (Signed)
[facility] Night - Client Nonclinical Telephone Record AccessNurse Client [facility] Night - Client Client Site [employer] Physician Alma Friendly - NP Contact Type Call Who Is Calling Physician / Provider / Hospital Call Type Provider Call Message Only Reason for Call Request to send message to Office Initial Comment [name] [phone] Christian Petty - [birth year] Needing to request medical records. Additional Comment Disp. Time Disposition Final User 01/23/2020 5:52:22 PM General Information Provided Yes Wisdom, Melynda Call Closed By: Mauri Pole Transaction Date/Time: 01/23/2020 5:49:09 PM (ET)

## 2020-01-24 NOTE — Telephone Encounter (Signed)
Rose with [employer] will fax written authorization from pt to release medical records and will include any questions needing a response. Fax # verified [phone].

## 2020-03-04 ENCOUNTER — Other Ambulatory Visit: Payer: Self-pay | Admitting: Primary Care

## 2020-03-04 DIAGNOSIS — E118 Type 2 diabetes mellitus with unspecified complications: Secondary | ICD-10-CM

## 2020-03-04 DIAGNOSIS — Z794 Long term (current) use of insulin: Secondary | ICD-10-CM

## 2020-03-05 ENCOUNTER — Telehealth: Payer: Self-pay | Admitting: Primary Care

## 2020-03-05 NOTE — Telephone Encounter (Signed)
Christian Petty from [employer] came in office needing medical records. He gave me a copy of a authorization for Medical Information form signed by patient. I gave him the number to [employer] to check on patients MR. After calling they stated they would fax over records but shortly called back stating they need [place] to fax over authorization form and decide if theres more information they need before processing and sending the records over. After faxing over the form to [employer] they then stated the form that was faxed want enough and they need a company request with the [employer] letter head addressed to [facility] with a body stating why they need records and where they are being sent to. They said afterwards that they would need to process request which could take 7-10 days.. Mr.Philips stated that he had spoken to Shirlean Mylar twice about request and she said everything was fine and getting processed but he's confused as to why [employer] hasn't received anything. I checked patients chart and registration as well and I do not see any Medical Record Release forms. Mr.Philips is now going to restart the process again and let me know when he faxes over company request to [place] so that I can send it to [employer] then have them fax it to [employer].

## 2020-06-02 ENCOUNTER — Other Ambulatory Visit: Payer: Self-pay | Admitting: Primary Care

## 2020-06-02 DIAGNOSIS — E782 Mixed hyperlipidemia: Secondary | ICD-10-CM

## 2020-06-03 NOTE — Telephone Encounter (Signed)
Pt called wanting to change pharmacy.  He would like rx sent to [facility]   Best number [phone]   He also stated he needs to get a refill  Metformin

## 2020-06-05 ENCOUNTER — Other Ambulatory Visit: Payer: Self-pay

## 2020-06-05 ENCOUNTER — Other Ambulatory Visit: Payer: Self-pay | Admitting: Primary Care

## 2020-06-05 DIAGNOSIS — I10 Essential (primary) hypertension: Secondary | ICD-10-CM

## 2020-06-05 DIAGNOSIS — N400 Enlarged prostate without lower urinary tract symptoms: Secondary | ICD-10-CM

## 2020-06-05 DIAGNOSIS — E782 Mixed hyperlipidemia: Secondary | ICD-10-CM

## 2020-06-05 DIAGNOSIS — E118 Type 2 diabetes mellitus with unspecified complications: Secondary | ICD-10-CM

## 2020-06-05 DIAGNOSIS — E119 Type 2 diabetes mellitus without complications: Secondary | ICD-10-CM

## 2020-06-05 DIAGNOSIS — Z794 Long term (current) use of insulin: Secondary | ICD-10-CM

## 2020-06-05 MED ORDER — METFORMIN HCL 1000 MG PO TABS: ORAL_TABLET | ORAL | 0 refills | Status: DC

## 2020-06-05 MED ORDER — ATORVASTATIN CALCIUM 40 MG PO TABS: 40.0000 mg | ORAL_TABLET | Freq: Every day | ORAL | 3 refills | Status: DC

## 2020-06-05 MED ORDER — LOSARTAN POTASSIUM 100 MG PO TABS: ORAL_TABLET | ORAL | 0 refills | Status: DC

## 2020-06-05 MED ORDER — LANTUS SOLOSTAR 100 UNIT/ML ~~LOC~~ SOPN: PEN_INJECTOR | SUBCUTANEOUS | 2 refills | Status: DC

## 2020-06-05 MED ORDER — TAMSULOSIN HCL 0.4 MG PO CAPS: 0.4000 mg | ORAL_CAPSULE | Freq: Every day | ORAL | 0 refills | Status: DC

## 2020-06-05 MED ORDER — CAREFINE PEN NEEDLES 31G X 8 MM MISC: 3 refills | Status: DC

## 2020-06-05 MED ORDER — CONTOUR NEXT TEST VI STRP: ORAL_STRIP | 2 refills | Status: AC

## 2020-06-05 MED ORDER — FENOFIBRATE 160 MG PO TABS: 160.0000 mg | ORAL_TABLET | Freq: Every day | ORAL | 0 refills | Status: DC

## 2020-06-05 MED ORDER — LANCETS MISC: 0 refills | Status: AC

## 2020-06-05 MED ORDER — GLIPIZIDE 10 MG PO TABS: ORAL_TABLET | ORAL | 0 refills | Status: DC

## 2020-06-05 NOTE — Telephone Encounter (Signed)
Called patient per patient request new scripts for all meds sent to new pharmacy. Will call if any issues.

## 2020-07-26 ENCOUNTER — Other Ambulatory Visit: Payer: Self-pay | Admitting: Primary Care

## 2020-07-26 DIAGNOSIS — E119 Type 2 diabetes mellitus without complications: Secondary | ICD-10-CM

## 2020-07-28 ENCOUNTER — Other Ambulatory Visit: Payer: Self-pay | Admitting: Primary Care

## 2020-07-28 DIAGNOSIS — I10 Essential (primary) hypertension: Secondary | ICD-10-CM

## 2020-08-08 ENCOUNTER — Ambulatory Visit: Payer: BC Managed Care – PPO | Admitting: Primary Care

## 2020-09-01 ENCOUNTER — Other Ambulatory Visit: Payer: Self-pay | Admitting: Primary Care

## 2020-09-01 DIAGNOSIS — E782 Mixed hyperlipidemia: Secondary | ICD-10-CM

## 2020-09-01 DIAGNOSIS — Z794 Long term (current) use of insulin: Secondary | ICD-10-CM

## 2020-09-01 DIAGNOSIS — E118 Type 2 diabetes mellitus with unspecified complications: Secondary | ICD-10-CM

## 2020-09-01 DIAGNOSIS — N400 Enlarged prostate without lower urinary tract symptoms: Secondary | ICD-10-CM

## 2020-09-01 NOTE — Telephone Encounter (Signed)
Patient overdue for diabetes follow-up, also due for general follow-up. Please notify him that he needs to schedule a follow-up visit in order to receive further refills.

## 2020-09-03 NOTE — Telephone Encounter (Signed)
Called patient and schedule DM follow up for 09/09/20

## 2020-09-09 ENCOUNTER — Encounter: Payer: Self-pay | Admitting: Primary Care

## 2020-09-09 ENCOUNTER — Other Ambulatory Visit: Payer: Self-pay

## 2020-09-09 ENCOUNTER — Ambulatory Visit (INDEPENDENT_AMBULATORY_CARE_PROVIDER_SITE_OTHER): Payer: Medicare Other | Admitting: Primary Care

## 2020-09-09 VITALS — BP 130/50 | HR 83 | Temp 97.2°F | Wt 205.0 lb

## 2020-09-09 DIAGNOSIS — E782 Mixed hyperlipidemia: Secondary | ICD-10-CM | POA: Diagnosis not present

## 2020-09-09 DIAGNOSIS — M545 Low back pain, unspecified: Secondary | ICD-10-CM

## 2020-09-09 DIAGNOSIS — I10 Essential (primary) hypertension: Secondary | ICD-10-CM

## 2020-09-09 DIAGNOSIS — N400 Enlarged prostate without lower urinary tract symptoms: Secondary | ICD-10-CM

## 2020-09-09 DIAGNOSIS — E119 Type 2 diabetes mellitus without complications: Secondary | ICD-10-CM | POA: Diagnosis not present

## 2020-09-09 DIAGNOSIS — Z125 Encounter for screening for malignant neoplasm of prostate: Secondary | ICD-10-CM | POA: Diagnosis not present

## 2020-09-09 DIAGNOSIS — Z1211 Encounter for screening for malignant neoplasm of colon: Secondary | ICD-10-CM

## 2020-09-09 DIAGNOSIS — E118 Type 2 diabetes mellitus with unspecified complications: Secondary | ICD-10-CM

## 2020-09-09 DIAGNOSIS — Z794 Long term (current) use of insulin: Secondary | ICD-10-CM

## 2020-09-09 DIAGNOSIS — G8929 Other chronic pain: Secondary | ICD-10-CM

## 2020-09-09 LAB — COMPREHENSIVE METABOLIC PANEL
ALT: 17 U/L (ref 0–53)
AST: 13 U/L (ref 0–37)
Albumin: 4.2 g/dL (ref 3.5–5.2)
Alkaline Phosphatase: 33 U/L — ABNORMAL LOW (ref 39–117)
BUN: 19 mg/dL (ref 6–23)
CO2: 29 mEq/L (ref 19–32)
Calcium: 10.6 mg/dL — ABNORMAL HIGH (ref 8.4–10.5)
Chloride: 100 mEq/L (ref 96–112)
Creatinine, Ser: 1.02 mg/dL (ref 0.40–1.50)
GFR: 77.02 mL/min (ref >60.00)
Glucose, Bld: 80 mg/dL (ref 70–99)
Potassium: 4.5 mEq/L (ref 3.5–5.1)
Sodium: 138 mEq/L (ref 135–145)
Total Bilirubin: 0.8 mg/dL (ref 0.2–1.2)
Total Protein: 6.7 g/dL (ref 6.0–8.3)

## 2020-09-09 LAB — POCT GLYCOSYLATED HEMOGLOBIN (HGB A1C): Hemoglobin A1C: 8 % — AB (ref 4.0–5.6)

## 2020-09-09 LAB — LIPID PANEL
Cholesterol: 132 mg/dL (ref 0–200)
HDL: 23.6 mg/dL — ABNORMAL LOW (ref >39.00)
LDL Cholesterol: 70 mg/dL (ref 0–99)
NonHDL: 107.94
Total CHOL/HDL Ratio: 6
Triglycerides: 191 mg/dL — ABNORMAL HIGH (ref 0.0–149.0)
VLDL: 38.2 mg/dL (ref 0.0–40.0)

## 2020-09-09 LAB — PSA: PSA: 0.57 ng/mL (ref 0.10–4.00)

## 2020-09-09 MED ORDER — METFORMIN HCL 1000 MG PO TABS: ORAL_TABLET | ORAL | 3 refills | Status: DC

## 2020-09-09 MED ORDER — TRULICITY 0.75 MG/0.5ML ~~LOC~~ SOAJ: 0.7500 mg | SUBCUTANEOUS | 1 refills | Status: DC

## 2020-09-09 MED ORDER — TAMSULOSIN HCL 0.4 MG PO CAPS: 0.4000 mg | ORAL_CAPSULE | Freq: Every day | ORAL | 3 refills | Status: DC

## 2020-09-09 NOTE — Assessment & Plan Note (Signed)
Doing well on tamsulosin 0.4 mg, continue same. PSA pending.

## 2020-09-09 NOTE — Assessment & Plan Note (Signed)
Overall stable, continue losartan 100 mg daily. CMP pending.

## 2020-09-09 NOTE — Assessment & Plan Note (Signed)
Uncontrolled today with A1C of 8.0.  Poor diet, no regular exercise. Strongly advised he improve.  Also discussed the absolute need to check blood sugars 1-2 times daily at minimum.   Continue metformin 1000 mg twice daily, glipizide 10 mg twice daily, Lantus 45 units daily. Add Trulicity 6.25 mg daily.   We will see him back in 3 months for follow up.

## 2020-09-09 NOTE — Patient Instructions (Addendum)
Continue taking metformin 1000 mg twice daily for diabetes. Continue taking Glipizide 10 mg twice daily for diabetes.  Continue injecting 45 units of Lantus insulin for diabetes,  Start dulaglutide (Trulicity) 2.41 mg for diabetes. Inject into the skin once weekly.  Start checking your blood sugar levels everyday, at least twice daily. Appropriate times to check your blood sugar levels are:  -Before any meal (breakfast, lunch, dinner) -Two hours after any meal (breakfast, lunch, dinner) -Bedtime  Record your readings and notify me if you continue to consistently run at or above 200   Please schedule a follow up appointment in 3 months for diabetes check.   It was a pleasure to see you today!

## 2020-09-09 NOTE — Assessment & Plan Note (Signed)
Compliant to atorvastatin 40mg  and fenofibrate 160 mg daily. Continue both. Repeat lipid panel pending.

## 2020-09-09 NOTE — Progress Notes (Signed)
Subjective:    Patient ID: Christian Petty, male    DOB: 11/03/1955, 65 y.o.   MRN: [id number]  HPI  This visit occurred during the SARS-CoV-2 public health emergency.  Safety protocols were in place, including screening questions prior to the visit, additional usage of staff PPE, and extensive cleaning of exam room while observing appropriate contact time as indicated for disinfecting solutions.   Christian Petty is a 65 year old male with a history of hypertension, type 2 diabetes, BPH, hyperlipidemia who presents today for follow up. He has not been seen since April 2021.  1) Type 2 Diabetes:   Current medications include: Metformin 1000 mg BID, Glipizide 10 mg BID, Lantus 45 units. He's misses his Lantus injection twice weekly, sometimes missing the evening doses.   He denies a poor diet, eats a lot of junk food, is not exercising.   He is checking his blood glucose 2-3 times weekly and is getting readings of 150's-160's, sometimes low 200's.   He denies dizziness, visual changes, chest, pain.   Last A1C: 7.7 in April 2021,  Last Eye Exam: Due, he will schedule Last Foot Exam: UTD Pneumonia Vaccination: Due ACE/ARB: Losartan  Statin: Lipitor, fenofibrate 160 mg   2) Hypertension: Currently managed on losartan 100 mg. He denies chest pain, shortness of breath, dizziness.   BP Readings from Last 3 Encounters:  09/09/20 (!) 130/50  10/29/19 126/82  06/07/19 130/84   3) BPH: Currently managed on tamsulosin 0.4 mg, doing well on this regimen.   Wt Readings from Last 3 Encounters:  09/09/20 205 lb (93 kg)  10/29/19 207 lb (93.9 kg)  06/07/19 216 lb 4 oz (98.1 kg)     Review of Systems  Eyes: Negative for visual disturbance.  Respiratory: Negative for shortness of breath.   Cardiovascular: Negative for chest pain.  Neurological: Negative for dizziness and numbness.       Past Medical History:  Diagnosis Date  . BPH (benign prostatic hyperplasia)   . Chronic back pain   . HTN  (hypertension)    states under control with med., has been on med. x 2-3 yr.  . Hyperlipidemia, mild   . Insulin dependent diabetes mellitus   . Trigger thumb of left hand 10/2017     Social History   Socioeconomic History  . Marital status: Married    Spouse name: Not on file  . Number of children: 2  . Years of education: Not on file  . Highest education level: Not on file  Occupational History  . Occupation: truck Geophysicist/field seismologist   Tobacco Use  . Smoking status: Former Smoker    Packs/day: 0.00    Quit date: 11/16/2010    Years since quitting: 9.8  . Smokeless tobacco: Never Used  Vaping Use  . Vaping Use: Never used  Substance and Sexual Activity  . Alcohol use: Yes    Comment: occasionally  . Drug use: No  . Sexual activity: Not on file  Other Topics Concern  . Not on file  Social History Narrative   Married.   2 children, 2 grandchildren.   Works for himself as a Administrator.   Social Determinants of Health   Financial Resource Strain: Not on file  Food Insecurity: Not on file  Transportation Needs: Not on file  Physical Activity: Not on file  Stress: Not on file  Social Connections: Not on file  Intimate Partner Violence: Not on file    Past Surgical History:  Procedure  Laterality Date  . COLONOSCOPY  06/19/2006  . COLONOSCOPY WITH PROPOFOL  09/28/2016  . INGUINAL HERNIA REPAIR    . LUMBAR LAMINECTOMY     x 2  . TRIGGER FINGER RELEASE Right 10/04/2017   Procedure: RELEASE TRIGGER THUMB, RIGHT;  Surgeon: Marchia Bond, MD;  Location: [facility];  Service: Orthopedics;  Laterality: Right;    Family History  Problem Relation Age of Onset  . Diabetes Father   . Breast cancer Mother   . Stroke Other 52       F    No Known Allergies  Current Outpatient Medications on File Prior to Visit  Medication Sig Dispense Refill  . atorvastatin (LIPITOR) 40 MG tablet Take 1 tablet (40 mg total) by mouth daily. For cholesterol. 90 tablet 3  . CONTOUR  NEXT TEST test strip Use as instructed 100 strip 2  . fenofibrate 160 MG tablet Take 1 tablet (160 mg total) by mouth daily. For cholesterol. 30 tablet 0  . glipiZIDE (GLUCOTROL) 10 MG tablet TAKE 1 TABLET BY MOUTH TWICE A DAY BEFORE A MEAL FOR DIABETES. 180 tablet 0  . insulin glargine (LANTUS SOLOSTAR) 100 UNIT/ML Solostar Pen INJECT 45 UNITS INTO THE SKIN AT BEDTIME FOR DIABETES 15 mL 2  . Insulin Pen Needle (CAREFINE PEN NEEDLES) 31G X 8 MM MISC Use with insulin once nightly at bedtime. 100 each 3  . Lancets MISC Use as instructed to test blood sugar daily 100 each 0  . losartan (COZAAR) 100 MG tablet TAKE 1 TABLET BY MOUTH ONCE DAILY FOR BLOOD PRESSURE 90 tablet 0  . meloxicam (MOBIC) 15 MG tablet TAKE 1 TABLET BY MOUTH EVERY DAY FOR 2 WEEKS AND THEN ONLY AS NEEDED THEREAFTER     No current facility-administered medications on file prior to visit.    BP (!) 130/50 (BP Location: Left Arm, Patient Position: Sitting, Cuff Size: Large)   Pulse 83   Temp (!) 97.2 F (36.2 C) (Temporal)   Wt 205 lb (93 kg)   SpO2 98%   BMI 28.59 kg/m    Objective:   Physical Exam Constitutional:      Appearance: He is well-nourished.  Cardiovascular:     Rate and Rhythm: Normal rate and regular rhythm.  Pulmonary:     Effort: Pulmonary effort is normal.     Breath sounds: Normal breath sounds.  Musculoskeletal:     Cervical back: Neck supple.  Skin:    General: Skin is warm and dry.  Psychiatric:        Mood and Affect: Mood and affect and mood normal.            Assessment & Plan:

## 2020-09-09 NOTE — Assessment & Plan Note (Signed)
No longer on Meloxicam 15 mg, removed from medication list.

## 2020-09-15 ENCOUNTER — Other Ambulatory Visit: Payer: Self-pay | Admitting: Primary Care

## 2020-09-15 DIAGNOSIS — E119 Type 2 diabetes mellitus without complications: Secondary | ICD-10-CM

## 2020-09-17 ENCOUNTER — Telehealth: Payer: Self-pay

## 2020-09-17 NOTE — Telephone Encounter (Signed)
Called patient to let know ppw completed. Per patient request copy left a reception for pick up.

## 2020-09-30 ENCOUNTER — Other Ambulatory Visit: Payer: Self-pay | Admitting: Primary Care

## 2020-09-30 DIAGNOSIS — E782 Mixed hyperlipidemia: Secondary | ICD-10-CM

## 2020-09-30 DIAGNOSIS — E118 Type 2 diabetes mellitus with unspecified complications: Secondary | ICD-10-CM

## 2020-09-30 DIAGNOSIS — N400 Enlarged prostate without lower urinary tract symptoms: Secondary | ICD-10-CM

## 2020-09-30 DIAGNOSIS — Z794 Long term (current) use of insulin: Secondary | ICD-10-CM

## 2020-09-30 DIAGNOSIS — E119 Type 2 diabetes mellitus without complications: Secondary | ICD-10-CM

## 2020-10-03 ENCOUNTER — Telehealth: Payer: Self-pay

## 2020-10-03 NOTE — Telephone Encounter (Signed)
Yes, we are adding patient on for next week.  Christian Petty has discussed ED precautions.

## 2020-10-03 NOTE — Telephone Encounter (Signed)
Christian Petty - Client TELEPHONE ADVICE RECORD [facility] Patient Name: Christian Petty Gender: Male DOB: Mar 13, 1956 Age: 65 Y 45 M 29 D Return Phone Number: [phone] (Primary) Address[place] [address] Client Christian Petty - Client Client Site [facility] - Petty Physician Alma Friendly - NP Contact Type Call Who Is Calling Patient / Member / Family / Caregiver Call Type Triage / Clinical Relationship To Patient Self Return Phone Number [phone] (Primary) Chief Complaint Blood Sugar Low Reason for Call Symptomatic / Request for Health Information Initial Comment Caller states is from [facility] office. Pt is complaining that his sugar going up and down that messed up his vision last night. Still having vision issues. No appts available. Translation No Nurse Assessment Nurse: Alveta Heimlich, RN, Rise Paganini Date/Time (Eastern Time): 10/03/2020 2:48:07 PM Confirm and document reason for call. If symptomatic, describe symptoms. ---Caller states his blood sugar has been up and down since last night. It was low and made him staggery. He does not know what is was. It is now 189. He has not had the vision problems today. He does take insulin. He feels very weak with no energy. Does the patient have any new or worsening symptoms? ---Yes Will a triage be completed? ---Yes Related visit to physician within the last 2 weeks? ---No Does the PT have any chronic conditions? (i.e. diabetes, asthma, this includes High risk factors for pregnancy, etc.) ---Yes List chronic conditions. ---diabetes Is this a behavioral health or substance abuse call? ---No Guidelines Guideline Title Affirmed Question Affirmed Notes Nurse Date/Time (Eastern Time) Diabetes - Low Blood Sugar Patient sounds very sick or weak to the triager Alveta Heimlich, RN, [facility] 10/03/2020 2:50:37 PM Disp. Time Eilene Ghazi Time) Disposition Final  User 10/03/2020 2:51:41 PM Go to ED Now (or PCP triage) Yes Alveta Heimlich, RN, Ali Lowe Disagree/Comply Disagree PLEASE NOTE: All timestamps contained within this report are represented as [place] Standard Time. CONFIDENTIALTY NOTICE: This fax transmission is intended only for the addressee. It contains information that is legally privileged, confidential or otherwise protected from use or disclosure. If you are not the intended recipient, you are strictly prohibited from reviewing, disclosing, copying using or disseminating any of this information or taking any action in reliance on or regarding this information. If you have received this fax in error, please notify us immediately by telephone so that we can arrange for its return to [place]. Phone: [phone], Toll-Free: [phone], Fax: [phone] Page: 2 of 2 Call Id: [id number] [place] Understands Yes PreDisposition Call Doctor Care Advice Given Per Guideline GO TO ED NOW (OR PCP TRIAGE): * IF NO PCP (PRIMARY CARE PROVIDER) SECOND-LEVEL TRIAGE: You need to be seen within the next hour. Go to the [facility] at _____________ [place] as soon as you can. CARE ADVICE given per Diabetes - Low Blood Sugar (Adult) guideline. Comments User: Debby Bud, RN Date/Time Eilene Ghazi Time): 10/03/2020 2:53:08 PM He refused ER or urgent care and says he wants them to work him in at the office if possible. Will check with office. User: Debby Bud, RN Date/Time Eilene Ghazi Time): 10/03/2020 2:56:40 PM Spoke to Andee Poles on the backline. She will send a message to the team and someone will call him back. Informed pt to be waiting for a call. Referrals GO TO FACILITY REFUSED

## 2020-10-03 NOTE — Telephone Encounter (Signed)
Think you have addressed this already

## 2020-10-03 NOTE — Telephone Encounter (Signed)
Patient called in stating his blood sugar was spiking and then dropping, started to have issues with his vision then it progressed to weakness today. Was advised from [facility] to go to urgent care or ED or UC but has refused.

## 2020-10-03 NOTE — Telephone Encounter (Signed)
[facility] - Client TELEPHONE ADVICE RECORD [facility] Patient Name: Christian Petty Gender: Male DOB: 10/18/1955 Age: 65 Y 70 M 74 D Return Phone Number: [phone] (Primary) Address[place] [address] Client [facility] - Client Client Site [facility] - [facility] Physician Alma Friendly - NP Contact Type Call Who Is Calling Patient / Member / Family / Caregiver Call Type Triage / Clinical Relationship To Patient Self Return Phone Number [phone] (Primary) Chief Complaint Blood Sugar Low Reason for Call Symptomatic / Request for Health Information Initial Comment Caller states is from [facility] office. Pt is complaining that his sugar going up and down that messed up his vision last night. Still having vision issues. No appts available. Translation No Nurse Assessment Nurse: Alveta Heimlich, RN, Rise Paganini Date/Time (Eastern Time): 10/03/2020 2:48:07 PM Confirm and document reason for call. If symptomatic, describe symptoms. ---Caller states his blood sugar has been up and down since last night. It was low and made him staggery. He does not know what is was. It is now 189. He has not had the vision problems today. He does take insulin. He feels very weak with no energy. Does the patient have any new or worsening symptoms? ---Yes Will a triage be completed? ---Yes Related visit to physician within the last 2 weeks? ---No Does the PT have any chronic conditions? (i.e. diabetes, asthma, this includes High risk factors for pregnancy, etc.) ---Yes List chronic conditions. ---diabetes Is this a behavioral health or substance abuse call? ---No Guidelines Guideline Title Affirmed Question Affirmed Notes Nurse Date/Time (Eastern Time) Diabetes - Low Blood Sugar Patient sounds very sick or weak to the triager Alveta Heimlich, RN, [facility] 10/03/2020 2:50:37 PM Disp. Time Eilene Ghazi Time) Disposition Final  User 10/03/2020 2:51:41 PM Go to ED Now (or PCP triage) Yes Alveta Heimlich, RN, Ali Lowe Disagree/Comply Disagree PLEASE NOTE: All timestamps contained within this report are represented as [place] Standard Time. CONFIDENTIALTY NOTICE: This fax transmission is intended only for the addressee. It contains information that is legally privileged, confidential or otherwise protected from use or disclosure. If you are not the intended recipient, you are strictly prohibited from reviewing, disclosing, copying using or disseminating any of this information or taking any action in reliance on or regarding this information. If you have received this fax in error, please notify us immediately by telephone so that we can arrange for its return to [place]. Phone: [phone], Toll-Free: [phone], Fax: [phone] Page: 2 of 2 Call Id: [id number] [place] Understands Yes PreDisposition Call Doctor Care Advice Given Per Guideline GO TO ED NOW (OR PCP TRIAGE): * IF NO PCP (PRIMARY CARE PROVIDER) SECOND-LEVEL TRIAGE: You need to be seen within the next hour. Go to the [facility] at _____________ [place] as soon as you can. CARE ADVICE given per Diabetes - Low Blood Sugar (Adult) guideline. Comments User: Debby Bud, RN Date/Time Eilene Ghazi Time): 10/03/2020 2:53:08 PM He refused ER or urgent care and says he wants them to work him in at the office if possible. Will check with office. User: Debby Bud, RN Date/Time Eilene Ghazi Time): 10/03/2020 2:56:40 PM Spoke to Andee Poles on the backline. She will send a message to the team and someone will call him back. Informed pt to be waiting for a call. Referrals GO TO FACILITY REFUSED

## 2020-10-03 NOTE — Telephone Encounter (Signed)
I spoke with pt and on 10/02/20 at 10pm pt was watching TV and playing game on phone and his vision became messed up (pt could not describe vision issue more than was messed up), pt felt weak and no energy. Now pt is not having CP,SOB,Vision has cleared up, no dizziness and no H/A. Pt has generalized all over weakness.  10/02/20 BS not sure what time taken was 127. 10/03/20 BS 2 hrs after breakfast was 179 and today at 2:45 PM pt did not eat lunch BS was 179. Pts wife took BP in LA was 196/157 and I asked to repeat BS in RA BP ws 141/79 P 95 and on repeat LA was 153/83 P 92.  pts wife waited 15 ' and recked BP LA 141/73 P 93 and RA BP 149/89 P 93.  Pt has been taking the four diabetic meds as instructed by directions on med list.  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 Pt will see Gentry Fitz NP on 10/08/20 at 12:40 but needs covid test with neg results back on 10/06/20 for pt to have in office visit on 10/08/20. Pt voiced understanding. Pt refusing to go to ED or UC; advised pt if condition changes or worsens pt should go to UC or ED. Pt voiced understanding. Sending note to Gentry Fitz NP.

## 2020-10-03 NOTE — Telephone Encounter (Signed)
Noted, he has been given proper precautions.  Will evaluate patient as scheduled.

## 2020-10-05 ENCOUNTER — Other Ambulatory Visit: Payer: Self-pay | Admitting: Primary Care

## 2020-10-05 DIAGNOSIS — E782 Mixed hyperlipidemia: Secondary | ICD-10-CM

## 2020-10-07 ENCOUNTER — Ambulatory Visit: Payer: Medicare Other | Admitting: Family Medicine

## 2020-10-08 ENCOUNTER — Ambulatory Visit: Payer: Medicare Other | Admitting: Primary Care

## 2020-10-09 ENCOUNTER — Encounter: Payer: Self-pay | Admitting: Gastroenterology

## 2020-10-19 ENCOUNTER — Other Ambulatory Visit: Payer: Self-pay | Admitting: Primary Care

## 2020-10-19 DIAGNOSIS — E119 Type 2 diabetes mellitus without complications: Secondary | ICD-10-CM

## 2020-10-19 DIAGNOSIS — E782 Mixed hyperlipidemia: Secondary | ICD-10-CM

## 2020-11-03 ENCOUNTER — Other Ambulatory Visit: Payer: Self-pay | Admitting: Primary Care

## 2020-11-03 DIAGNOSIS — E119 Type 2 diabetes mellitus without complications: Secondary | ICD-10-CM

## 2020-11-03 DIAGNOSIS — I10 Essential (primary) hypertension: Secondary | ICD-10-CM

## 2020-12-08 ENCOUNTER — Other Ambulatory Visit: Payer: Self-pay

## 2020-12-08 ENCOUNTER — Ambulatory Visit (AMBULATORY_SURGERY_CENTER): Payer: Self-pay | Admitting: *Deleted

## 2020-12-08 VITALS — Ht 71.0 in | Wt 201.0 lb

## 2020-12-08 DIAGNOSIS — Z86010 Personal history of colonic polyps: Secondary | ICD-10-CM

## 2020-12-08 MED ORDER — PEG 3350-KCL-NA BICARB-NACL 420 G PO SOLR: 4000.0000 mL | Freq: Once | ORAL | 0 refills | Status: AC

## 2020-12-08 NOTE — Progress Notes (Signed)

## 2020-12-09 ENCOUNTER — Ambulatory Visit (INDEPENDENT_AMBULATORY_CARE_PROVIDER_SITE_OTHER): Payer: Medicare Other | Admitting: Primary Care

## 2020-12-09 ENCOUNTER — Encounter: Payer: Self-pay | Admitting: Primary Care

## 2020-12-09 ENCOUNTER — Other Ambulatory Visit: Payer: Self-pay

## 2020-12-09 VITALS — BP 138/64 | HR 100 | Temp 98.1°F | Ht 71.0 in | Wt 201.0 lb

## 2020-12-09 DIAGNOSIS — E119 Type 2 diabetes mellitus without complications: Secondary | ICD-10-CM | POA: Diagnosis not present

## 2020-12-09 LAB — POCT GLYCOSYLATED HEMOGLOBIN (HGB A1C): Hemoglobin A1C: 7.4 % — AB (ref 4.0–5.6)

## 2020-12-09 MED ORDER — LANTUS SOLOSTAR 100 UNIT/ML ~~LOC~~ SOPN: PEN_INJECTOR | SUBCUTANEOUS | 3 refills | Status: DC

## 2020-12-09 NOTE — Patient Instructions (Addendum)
Continue Lantus 45 units daily for diabetes. Continue glipizide, metformin, and Trulicity.  Continue to work on [employer], great job!!  Please schedule a follow up appointment in 6 months.   It was a pleasure to see you today!

## 2020-12-09 NOTE — Progress Notes (Signed)
Subjective:    Patient ID: Christian Petty, male    DOB: February 24, 1956, 65 y.o.   MRN: [id number]  HPI  Christian Petty is a very pleasant 65 y.o. male with a history of type 2 diabetes, hypertension, BPH, tobacco abuse, hyperlipidemia who presents today for follow up of diabetes.  Current medications include: Trulicity 2.40 mg weekly, glipizide 10 mg BID, Lantus 45 units HS, metformin 1000 mg BID.   He is checking his blood glucose 1 times daily and is getting readings of 100's-130's.   Last A1C: 8.0 in February 2022, 7.4 today Last Eye Exam: UTD Last Foot Exam: Due Pneumonia Vaccination: 2016 ACE/ARB: losartan  Statin: atorvastatin   Since his last visit he's cut back a lot on fast food, processed food. He is taking his meals with him on the road.    Review of Systems  Eyes: Negative for visual disturbance.  Respiratory: Negative for shortness of breath.   Cardiovascular: Negative for chest pain.  Neurological: Negative for dizziness and numbness.         Past Medical History:  Diagnosis Date  . Allergy   . Arthritis   . BPH (benign prostatic hyperplasia)   . Chronic back pain   . HTN (hypertension)    states under control with med., has been on med. x 2-3 yr.  . Hyperlipidemia, mild   . Insulin dependent diabetes mellitus   . Trigger thumb of left hand 10/2017    Social History   Socioeconomic History  . Marital status: Married    Spouse name: Not on file  . Number of children: 2  . Years of education: Not on file  . Highest education level: Not on file  Occupational History  . Occupation: truck Geophysicist/field seismologist   Tobacco Use  . Smoking status: Former Smoker    Packs/day: 0.00    Quit date: 11/16/2010    Years since quitting: 10.0  . Smokeless tobacco: Never Used  Vaping Use  . Vaping Use: Never used  Substance and Sexual Activity  . Alcohol use: Yes    Comment: occasionally  . Drug use: No  . Sexual activity: Not on file  Other Topics Concern  . Not on file  Social  History Narrative   Married.   2 children, 2 grandchildren.   Works for himself as a Administrator.   Social Determinants of Health   Financial Resource Strain: Not on file  Food Insecurity: Not on file  Transportation Needs: Not on file  Physical Activity: Not on file  Stress: Not on file  Social Connections: Not on file  Intimate Partner Violence: Not on file    Past Surgical History:  Procedure Laterality Date  . COLONOSCOPY  06/19/2006  . COLONOSCOPY WITH PROPOFOL  09/28/2016  . INGUINAL HERNIA REPAIR    . LUMBAR LAMINECTOMY     x 2  . POLYPECTOMY    . TRIGGER FINGER RELEASE Right 10/04/2017   Procedure: RELEASE TRIGGER THUMB, RIGHT;  Surgeon: Marchia Bond, MD;  Location: [facility];  Service: Orthopedics;  Laterality: Right;    Family History  Problem Relation Age of Onset  . Diabetes Father   . Breast cancer Mother   . Stroke Other 46       F  . Colon cancer Neg Hx   . Colon polyps Neg Hx   . Esophageal cancer Neg Hx   . Rectal cancer Neg Hx   . Stomach cancer Neg Hx  No Known Allergies  Current Outpatient Medications on File Prior to Visit  Medication Sig Dispense Refill  . atorvastatin (LIPITOR) 40 MG tablet TAKE 1 TABLET BY MOUTH ONCE DAILY FOR CHOLESTEROL 90 tablet 3  . CONTOUR NEXT TEST test strip Use as instructed 100 strip 2  . Dulaglutide (TRULICITY) 1.47 WG/9.5AO SOPN Inject 0.75 mg into the skin once a week. For diabetes. 6 mL 1  . fenofibrate 160 MG tablet TAKE 1 TABLET(160 MG) BY MOUTH DAILY FOR CHOLESTEROL 90 tablet 3  . glipiZIDE (GLUCOTROL) 10 MG tablet TAKE 1 TABLET BY MOUTH TWICE DAILY BEFORE A MEAL FOR DIABETES 180 tablet 0  . insulin glargine (LANTUS SOLOSTAR) 100 UNIT/ML Solostar Pen ADMINISTER 42 UNITS UNDER THE SKIN AT BEDTIME (Patient taking differently: ADMINISTER 45 UNITS UNDER THE SKIN AT BEDTIME) 15 mL 2  . Insulin Pen Needle (CAREFINE PEN NEEDLES) 31G X 8 MM MISC Use with insulin once nightly at bedtime. 100 each 3   . Lancets MISC Use as instructed to test blood sugar daily 100 each 0  . losartan (COZAAR) 100 MG tablet TAKE 1 TABLET BY MOUTH EVERY DAY FOR BLOOD PRESSURE 90 tablet 3  . metFORMIN (GLUCOPHAGE) 1000 MG tablet TAKE 1 TABLET BY MOUTH TWICE DAILY WITH MEAL FOR DIABETES 180 tablet 3  . tamsulosin (FLOMAX) 0.4 MG CAPS capsule TAKE 1 CAPSULE BY MOUTH DAILY AFTER BREAKFAST, FOR URINE FLOW 90 capsule 3   No current facility-administered medications on file prior to visit.    BP 138/64   Pulse 100   Temp 98.1 F (36.7 C) (Temporal)   Ht 5\' 11"  (1.803 m)   Wt 201 lb (91.2 kg)   SpO2 99%   BMI 28.03 kg/m  Objective:   Physical Exam Cardiovascular:     Rate and Rhythm: Normal rate and regular rhythm.  Pulmonary:     Effort: Pulmonary effort is normal.     Breath sounds: Normal breath sounds. No wheezing or rales.  Musculoskeletal:     Cervical back: Neck supple.  Skin:    General: Skin is warm and dry.  Neurological:     Mental Status: He is alert and oriented to person, place, and time.           Assessment & Plan:      This visit occurred during the SARS-CoV-2 public health emergency.  Safety protocols were in place, including screening questions prior to the visit, additional usage of staff PPE, and extensive cleaning of exam room while observing appropriate contact time as indicated for disinfecting solutions.

## 2020-12-09 NOTE — Assessment & Plan Note (Signed)
A1C of 7.4 today which is improved!! Commended him on dietary changes, encouraged to continue.   Continue Lantus 45 units daily, Trulicity 4.93 mg weekly, metformin 1000 mg BID, glipizide 10 mg BID.  Foot exam today. Managed on ARB and statin. Pneumonia and eye exam UTD.

## 2020-12-29 ENCOUNTER — Telehealth: Payer: Self-pay | Admitting: Gastroenterology

## 2020-12-29 ENCOUNTER — Encounter: Payer: Medicare Other | Admitting: Gastroenterology

## 2020-12-29 NOTE — Telephone Encounter (Signed)
Please see note.

## 2020-12-29 NOTE — Telephone Encounter (Signed)
Please let patient know that he will be charged for late cancellation as per office policy.

## 2020-12-29 NOTE — Telephone Encounter (Addendum)
Good morning Dr. Silverio Decamp, patient called stating they are out of town due to work and is not able to make it today to his procedure.  He will call at a later date to reschedule.

## 2021-01-13 ENCOUNTER — Other Ambulatory Visit: Payer: Self-pay | Admitting: Primary Care

## 2021-01-13 DIAGNOSIS — E119 Type 2 diabetes mellitus without complications: Secondary | ICD-10-CM

## 2021-01-14 ENCOUNTER — Encounter: Payer: Self-pay | Admitting: Emergency Medicine

## 2021-01-14 ENCOUNTER — Telehealth: Payer: Self-pay | Admitting: *Deleted

## 2021-01-14 ENCOUNTER — Other Ambulatory Visit: Payer: Self-pay

## 2021-01-14 ENCOUNTER — Ambulatory Visit
Admission: EM | Admit: 2021-01-14 | Discharge: 2021-01-14 | Disposition: A | Discharge status: Home / Self Care | Payer: Medicare Other | Attending: Emergency Medicine | Admitting: Emergency Medicine

## 2021-01-14 DIAGNOSIS — L03012 Cellulitis of left finger: Secondary | ICD-10-CM | POA: Diagnosis not present

## 2021-01-14 DIAGNOSIS — R200 Anesthesia of skin: Secondary | ICD-10-CM

## 2021-01-14 MED ORDER — SULFAMETHOXAZOLE-TRIMETHOPRIM 800-160 MG PO TABS: 1.0000 | ORAL_TABLET | Freq: Two times a day (BID) | ORAL | 0 refills | Status: AC

## 2021-01-14 NOTE — ED Triage Notes (Signed)
Pt presents today with c/o of pain/swelling to left pinky finger. He reports numbness to 5th and 3rd finger x 1 month. Denies injury.

## 2021-01-14 NOTE — Discharge Instructions (Addendum)
Take the antibiotic as directed.  Go to the emergency department if you have increased redness, increased swelling, fever, puslike drainage, or other concerning symptoms.   Schedule an appointment with an orthopedic hand specialist such as the one listed below for evaluation of the numbness in your fingers.

## 2021-01-14 NOTE — ED Provider Notes (Signed)
Christian Petty    CSN: [id number] Arrival date & time: 01/14/21  1449      History   Chief Complaint Chief Complaint  Patient presents with   Hand Pain    Left 5th    HPI Christian Petty is a 65 y.o. male.  Patient presents with pain, redness, swelling at the end of his left fifth finger.  No open wounds, drainage, ecchymosis.  He states this started 1 month ago but has gotten worse this week.  He also reports numbness in his fifth, fourth, third fingers.  No falls or injury.  No redness or swelling in his fourth or third fingers.  He denies fever, chills, or other symptoms.  His medical history includes diabetes, hypertension, chronic back pain, arthritis, trigger thumb on left hand.  The history is provided by the patient and medical records.   Past Medical History:  Diagnosis Date   Allergy    Arthritis    BPH (benign prostatic hyperplasia)    Chronic back pain    HTN (hypertension)    states under control with med., has been on med. x 2-3 yr.   Hyperlipidemia, mild    Insulin dependent diabetes mellitus    Trigger thumb of left hand 10/2017    Patient Active Problem List   Diagnosis Date Noted   Chronic back pain 11/24/2018   Murmur 09/08/2018   Trigger thumb of right hand 10/04/2017   Obesity (BMI 30-39.9) 09/25/2013   Hyperlipidemia 10/19/2011   General medical examination 11/09/2010   BPH (benign prostatic hyperplasia) 02/20/2008   TOBACCO ABUSE 02/20/2008   Type 2 diabetes mellitus (Early) 11/21/2007   HOMOCYSTINEMIA 11/21/2007   Essential hypertension 11/21/2007    Past Surgical History:  Procedure Laterality Date   COLONOSCOPY  06/19/2006   COLONOSCOPY WITH PROPOFOL  09/28/2016   INGUINAL HERNIA REPAIR     LUMBAR LAMINECTOMY     x 2   POLYPECTOMY     TRIGGER FINGER RELEASE Right 10/04/2017   Procedure: RELEASE TRIGGER THUMB, RIGHT;  Surgeon: Marchia Bond, MD;  Location: [facility];  Service: Orthopedics;  Laterality: Right;        Home Medications    Prior to Admission medications   Medication Sig Start Date End Date Taking? Authorizing Provider  sulfamethoxazole-trimethoprim (BACTRIM DS) 800-160 MG tablet Take 1 tablet by mouth 2 (two) times daily for 7 days. 01/14/21 01/21/21 Yes Sharion Balloon, NP  atorvastatin (LIPITOR) 40 MG tablet TAKE 1 TABLET BY MOUTH ONCE DAILY FOR CHOLESTEROL 10/20/20   Lesleigh Noe, MD  CONTOUR NEXT TEST test strip Use as instructed 06/05/20   Pleas Koch, NP  Dulaglutide (TRULICITY) 5.46 TK/3.5WS SOPN Inject 0.75 mg into the skin once a week. For diabetes. 09/09/20   Pleas Koch, NP  fenofibrate 160 MG tablet TAKE 1 TABLET(160 MG) BY MOUTH DAILY FOR CHOLESTEROL 10/05/20   Pleas Koch, NP  glipiZIDE (GLUCOTROL) 10 MG tablet TAKE 1 TABLET BY MOUTH TWICE DAILY BEFORE A MEAL FOR DIABETES 01/14/21   Pleas Koch, NP  insulin glargine (LANTUS SOLOSTAR) 100 UNIT/ML Solostar Pen ADMINISTER 45 UNITS UNDER THE SKIN AT BEDTIME for diabetes. 12/09/20   Pleas Koch, NP  Insulin Pen Needle (CAREFINE PEN NEEDLES) 31G X 8 MM MISC Use with insulin once nightly at bedtime. 06/05/20   Pleas Koch, NP  Lancets MISC Use as instructed to test blood sugar daily 06/05/20   Pleas Koch, NP  losartan (COZAAR)  100 MG tablet TAKE 1 TABLET BY MOUTH EVERY DAY FOR BLOOD PRESSURE 11/03/20   Pleas Koch, NP  metFORMIN (GLUCOPHAGE) 1000 MG tablet TAKE 1 TABLET BY MOUTH TWICE DAILY WITH MEAL FOR DIABETES 09/30/20   Pleas Koch, NP  tamsulosin (FLOMAX) 0.4 MG CAPS capsule TAKE 1 CAPSULE BY MOUTH DAILY AFTER BREAKFAST, FOR URINE FLOW 09/30/20   Pleas Koch, NP    Family History Family History  Problem Relation Age of Onset   Diabetes Father    Breast cancer Mother    Stroke Other 48       F   Colon cancer Neg Hx    Colon polyps Neg Hx    Esophageal cancer Neg Hx    Rectal cancer Neg Hx    Stomach cancer Neg Hx     Social History Social History    Tobacco Use   Smoking status: Former    Packs/day: 0.00    Pack years: 0.00    Types: Cigarettes    Quit date: 11/16/2010    Years since quitting: 10.1   Smokeless tobacco: Never  Vaping Use   Vaping Use: Never used  Substance Use Topics   Alcohol use: Yes    Comment: occasionally   Drug use: No     Allergies   Patient has no known allergies.   Review of Systems Review of Systems  Constitutional:  Negative for chills and fever.  Respiratory:  Negative for cough and shortness of breath.   Cardiovascular:  Negative for chest pain and palpitations.  Gastrointestinal:  Negative for abdominal pain and vomiting.  Musculoskeletal:  Positive for arthralgias and joint swelling.  Skin:  Positive for color change. Negative for rash.  Neurological:  Positive for numbness. Negative for weakness.  All other systems reviewed and are negative.   Physical Exam Triage Vital Signs ED Triage Vitals  Enc Vitals Group     BP 01/14/21 1622 (!) 158/65     Pulse Rate 01/14/21 1622 100     Resp 01/14/21 1622 18     Temp 01/14/21 1622 97.6 F (36.4 C)     Temp Source 01/14/21 1622 Oral     SpO2 01/14/21 1622 95 %     Weight --      Height --      Head Circumference --      Peak Flow --      Pain Score 01/14/21 1621 0     Pain Loc --      Pain Edu? --      Excl. in [place]? --    No data found.  Updated Vital Signs BP (!) 158/65 (BP Location: Right Arm)   Pulse 100   Temp 97.6 F (36.4 C) (Oral)   Resp 18   SpO2 95%   Visual Acuity Right Eye Distance:   Left Eye Distance:   Bilateral Distance:    Right Eye Near:   Left Eye Near:    Bilateral Near:     Physical Exam Vitals and nursing note reviewed.  Constitutional:      General: He is not in acute distress.    Appearance: He is well-developed. He is not ill-appearing.  HENT:     Head: Normocephalic and atraumatic.     Mouth/Throat:     Mouth: Mucous membranes are moist.  Eyes:     Conjunctiva/sclera: Conjunctivae  normal.  Cardiovascular:     Rate and Rhythm: Normal rate and regular rhythm.  Heart sounds: Normal heart sounds.  Pulmonary:     Effort: Pulmonary effort is normal. No respiratory distress.     Breath sounds: Normal breath sounds.  Abdominal:     Palpations: Abdomen is soft.     Tenderness: There is no abdominal tenderness.  Musculoskeletal:        General: Swelling and tenderness present. Normal range of motion.       Hands:     Cervical back: Neck supple.  Skin:    General: Skin is warm and dry.     Capillary Refill: Capillary refill takes less than 2 seconds.     Findings: Erythema present.     Comments: Mild erythema and edema of left fifth finger from DIP to tip.  No indication of felon.  No wounds or drainage.  No ecchymosis.  Nails on all fingers are cut/bitten down too quick.  Neurological:     General: No focal deficit present.     Mental Status: He is alert and oriented to person, place, and time.     Sensory: No sensory deficit.     Motor: No weakness.     Gait: Gait normal.  Psychiatric:        Mood and Affect: Mood normal.        Behavior: Behavior normal.     UC Treatments / Results  Labs (all labs ordered are listed, but only abnormal results are displayed) Labs Reviewed - No data to display  EKG   Radiology No results found.  Procedures Procedures (including critical care time)  Medications Ordered in UC Medications - No data to display  Initial Impression / Assessment and Plan / UC Course  I have reviewed the triage vital signs and the nursing notes.  Pertinent labs & imaging results that were available during my care of the patient were reviewed by me and considered in my medical decision making (see chart for details).  Cellulitis of left little finger.  Numbness of left fingers.  Treating cellulitis with Septra DS.  Strict instructions for going to the ER if he has signs of worsening infection discussed at length.  Discussed that he  should schedule an appointment with an orthopedic hand specialist to discuss the numbness in his fingers.  Contact information given for Dr. Caralyn Guile as he is on-call for unassigned today.  Patient agrees to plan of care.   Final Clinical Impressions(s) / UC Diagnoses   Final diagnoses:  Cellulitis of left little finger  Numbness of fingers     Discharge Instructions      Take the antibiotic as directed.  Go to the emergency department if you have increased redness, increased swelling, fever, puslike drainage, or other concerning symptoms.   Schedule an appointment with an orthopedic hand specialist such as the one listed below for evaluation of the numbness in your fingers.     ED Prescriptions     Medication Sig Dispense Auth. Provider   sulfamethoxazole-trimethoprim (BACTRIM DS) 800-160 MG tablet Take 1 tablet by mouth 2 (two) times daily for 7 days. 14 tablet Sharion Balloon, NP      I have reviewed the PDMP during this encounter.   Sharion Balloon, NP 01/14/21 [phone]

## 2021-01-14 NOTE — Telephone Encounter (Signed)
Patient had called the office and was transferred to Access Nurse because of symptoms. Patient called the office back stating that he was told that he needed to go to an UC. Patient stated that he was not just going to any UC because he does not trust them. Patient stated that he started with some numbness in his pinky finger on his left hand about a month ago. Patient stated now the numbness has spread to two other fingers. Patient stated that the pinky finger has gotten a lot worse in the past 2 days. Patient stated that the pinky finger is red, swollen and warm to the touch. Patient stated that he was told that Allie Bossier NP does not have any appointments available until 01/20/21. Patient stated that he knows that he should be seen before then. Patient was advised that he should not wait and should be seen today. There are not any appointments available at the office tomorrow. Patient was given information on the [facility]. Patient stated that he will plan on heading over there shortly.

## 2021-01-14 NOTE — Telephone Encounter (Signed)
PLEASE NOTE: All timestamps contained within this report are represented as [place] Standard Time. CONFIDENTIALTY NOTICE: This fax transmission is intended only for the addressee. It contains information that is legally privileged, confidential or otherwise protected from use or disclosure. If you are not the intended recipient, you are strictly prohibited from reviewing, disclosing, copying using or disseminating any of this information or taking any action in reliance on or regarding this information. If you have received this fax in error, please notify us immediately by telephone so that we can arrange for its return to [place]. Phone: [phone], Toll-Free: [phone], Fax: [phone] Page: 1 of 2 Call Id: [id number] [facility] - Client TELEPHONE ADVICE RECORD AccessNurse Patient Name: Christian Petty Gender: Male DOB: 1956-03-26 Age: 65 Y 17 D Return Phone Number: [phone] (Primary) Address[place]  [address] Client [facility] - Client Client Site [facility] - Day Physician Alma Friendly - NP Contact Type Call Who Is Calling Patient / Member / Family / Caregiver Call Type Triage / Clinical Relationship To Patient Self Return Phone Number [phone] (Primary) Chief Complaint NUMBNESS/TINGLING- sudden on one side of the body or face Reason for Call Symptomatic / Request for [place] states he his left hand is going numb and is spreading Translation No Nurse Assessment Nurse: Altamease Oiler, RN, Adriana Date/Time (Eastern Time): 01/14/2021 1:28:13 PM Confirm and document reason for call. If symptomatic, describe symptoms. ---pt states for about 1 month he noticed numbing to his "pinky" now has spread to next 2 fingers. left hand. Does the patient have any new or worsening symptoms? ---Yes Will a triage be completed? ---Yes Related visit to  physician within the last 2 weeks? ---No Does the PT have any chronic conditions? (i.e. diabetes, asthma, this includes High risk factors for pregnancy, etc.) ---Yes List chronic conditions. ---diabetes htn Is this a behavioral health or substance abuse call? ---No Guidelines Guideline Title Affirmed Question Affirmed Notes Nurse Date/Time (Eastern Time) Neurologic Deficit [1] Weakness of the face, arm / hand, or leg / foot on one side of the body AND [2] gradual onset (e.g., days to weeks) AND [3] present now Altamease Oiler, Therapist, sports, Fabio Bering 01/14/2021 1:30:11 PM Disp. Time Eilene Ghazi Time) Disposition Final User 01/14/2021 1:27:19 PM Send to Urgent Queue Gwenlyn Perking NOTE: All timestamps contained within this report are represented as [place] Standard Time. CONFIDENTIALTY NOTICE: This fax transmission is intended only for the addressee. It contains information that is legally privileged, confidential or otherwise protected from use or disclosure. If you are not the intended recipient, you are strictly prohibited from reviewing, disclosing, copying using or disseminating any of this information or taking any action in reliance on or regarding this information. If you have received this fax in error, please notify us immediately by telephone so that we can arrange for its return to [place]. Phone: [phone], Toll-Free: [phone], Fax: [phone] Page: 2 of 2 Call Id: [id number] 01/14/2021 1:35:24 PM See HCP within 4 Hours (or PCP triage) Yes Altamease Oiler, RN, Darylene Price Disagree/Comply Disagree Caller Understands Yes PreDisposition Call Doctor Care Advice Given Per Guideline SEE HCP (OR PCP TRIAGE) WITHIN 4 HOURS: * IF OFFICE WILL BE OPEN: You need to be seen within the next 3 or 4 hours. Call your doctor (or NP/PA) now or as soon as the office opens. CALL BACK IF: * You become worse CARE ADVICE given per Neurologic Deficit (Adult) guideline. Comments  User: Kizzie Fantasia, RN Date/Time  Eilene Ghazi Time): 01/14/2021 1:36:51 PM pt refused triage outcome. states he would rather be seen at the office. transferred pt to office at this time Referrals [facility] REFUSED

## 2021-01-14 NOTE — Telephone Encounter (Signed)
It looks like he has checked in at [facility]. Please check on him tomorrow. Will await notes.

## 2021-01-28 ENCOUNTER — Ambulatory Visit (INDEPENDENT_AMBULATORY_CARE_PROVIDER_SITE_OTHER): Payer: Medicare Other | Admitting: Primary Care

## 2021-01-28 ENCOUNTER — Encounter: Payer: Self-pay | Admitting: Primary Care

## 2021-01-28 ENCOUNTER — Other Ambulatory Visit: Payer: Self-pay

## 2021-01-28 VITALS — BP 118/58 | HR 100 | Temp 97.9°F | Ht 71.0 in | Wt 197.0 lb

## 2021-01-28 DIAGNOSIS — Z794 Long term (current) use of insulin: Secondary | ICD-10-CM

## 2021-01-28 DIAGNOSIS — I10 Essential (primary) hypertension: Secondary | ICD-10-CM | POA: Diagnosis not present

## 2021-01-28 DIAGNOSIS — R42 Dizziness and giddiness: Secondary | ICD-10-CM

## 2021-01-28 DIAGNOSIS — E119 Type 2 diabetes mellitus without complications: Secondary | ICD-10-CM

## 2021-01-28 DIAGNOSIS — F17200 Nicotine dependence, unspecified, uncomplicated: Secondary | ICD-10-CM | POA: Diagnosis not present

## 2021-01-28 DIAGNOSIS — R5383 Other fatigue: Secondary | ICD-10-CM

## 2021-01-28 DIAGNOSIS — Z1159 Encounter for screening for other viral diseases: Secondary | ICD-10-CM

## 2021-01-28 DIAGNOSIS — R011 Cardiac murmur, unspecified: Secondary | ICD-10-CM | POA: Diagnosis not present

## 2021-01-28 HISTORY — DX: Dizziness and giddiness: R42

## 2021-01-28 LAB — BASIC METABOLIC PANEL WITH GFR
BUN: 31 mg/dL — ABNORMAL HIGH (ref 6–23)
CO2: 24 meq/L (ref 19–32)
Calcium: 9.9 mg/dL (ref 8.4–10.5)
Chloride: 106 meq/L (ref 96–112)
Creatinine, Ser: 1.38 mg/dL (ref 0.40–1.50)
GFR: 53.45 mL/min — ABNORMAL LOW
Glucose, Bld: 239 mg/dL — ABNORMAL HIGH (ref 70–99)
Potassium: 4.6 meq/L (ref 3.5–5.1)
Sodium: 139 meq/L (ref 135–145)

## 2021-01-28 LAB — CBC
HCT: 33.7 % — ABNORMAL LOW (ref 39.0–52.0)
Hemoglobin: 11.2 g/dL — ABNORMAL LOW (ref 13.0–17.0)
MCHC: 33.4 g/dL (ref 30.0–36.0)
MCV: 80.3 fl (ref 78.0–100.0)
Platelets: 385 10*3/uL (ref 150.0–400.0)
RBC: 4.2 Mil/uL — ABNORMAL LOW (ref 4.22–5.81)
RDW: 14.7 % (ref 11.5–15.5)
WBC: 8.2 10*3/uL (ref 4.0–10.5)

## 2021-01-28 LAB — TSH: TSH: 1.47 u[IU]/mL (ref 0.35–5.50)

## 2021-01-28 NOTE — Assessment & Plan Note (Addendum)
Chronic, intermittent, briefly occurring with ambulation.  ECG today with NSR with rate of 92. No PAC/PVC, acute ST changes. Appears very similar to ECG from 2019.  Orthostatic from lying to standing, discussed this with patient today.   Checking labs.

## 2021-01-28 NOTE — Progress Notes (Signed)
Subjective:    Patient ID: Christian Petty, male    DOB: 1955-10-28, 65 y.o.   MRN: [id number]  HPI  Christian Petty is a very pleasant 66 y.o. male with a history of hypertension, type 2 diabetes, trigger thumb, BPH, tobacco abuse, chronic back pain who presents today to discuss multiple symptoms.  He feels fatigued, no energy, "can't get enough sleep". Sleeping longer than usual, can sleep all day, had to be woken up on the 4th of July to take his evening medications, will fall sleep watching TV. Denies feeling tired when driving. He snores, has snored for years, doesn't sleep in the same room with his wife. He's never had a sleep study.   This morning he's noticed peeling skin to the fingertips of his left hand. Recent infection to left 5th digit, treated at urgent care about 1-2 weeks ago, completed his seven day course of Bactrim DS tablets. Infection resolved.   A1C in late May 2022 of 7.4. He admits to not taking any of his medications for two days during the week of July 4th, forgot to take. He is checking his blood sugars three to four times weekly, he is not checking daily. Readings are ranging mid 100's to low 200's.   He's noticed intermittent dizziness when walking, occurs quickly and will last a few seconds. Denies falls. This began 6 months ago.   Weight loss of 8 pounds since February 2022 (per our scales). He has been trying to lose weight but cutting back on junk food, improving his diet overall, less fast food, more fruit.   He denies night sweats, hematuria, rectal bleeding, chest pain, shortness of breath. He quit smoking 10 years ago, smoked for 40+ years, two PPD. Is now using a vape once weekly.    Wt Readings from Last 3 Encounters:  01/28/21 197 lb (89.4 kg)  12/09/20 201 lb (91.2 kg)  12/08/20 201 lb (91.2 kg)     BP Readings from Last 3 Encounters:  01/28/21 (!) 118/58  01/14/21 (!) 158/65  12/09/20 138/64        Review of Systems  Constitutional:  Positive  for fatigue. Negative for unexpected weight change.  Respiratory:  Negative for cough and shortness of breath.   Cardiovascular:  Negative for chest pain.  Gastrointestinal:  Negative for blood in stool.  Neurological:  Positive for dizziness. Negative for headaches.        Past Medical History:  Diagnosis Date   Allergy    Arthritis    BPH (benign prostatic hyperplasia)    Chronic back pain    HTN (hypertension)    states under control with med., has been on med. x 2-3 yr.   Hyperlipidemia, mild    Insulin dependent diabetes mellitus    Trigger thumb of left hand 10/2017    Social History   Socioeconomic History   Marital status: Married    Spouse name: Not on file   Number of children: 2   Years of education: Not on file   Highest education level: Not on file  Occupational History   Occupation: truck driver   Tobacco Use   Smoking status: Former    Packs/day: 0.00    Pack years: 0.00    Types: Cigarettes    Quit date: 11/16/2010    Years since quitting: 10.2   Smokeless tobacco: Never  Vaping Use   Vaping Use: Never used  Substance and Sexual Activity   Alcohol use: Yes  Comment: occasionally   Drug use: No   Sexual activity: Not on file  Other Topics Concern   Not on file  Social History Narrative   Married.   2 children, 2 grandchildren.   Works for himself as a Administrator.   Social Determinants of Health   Financial Resource Strain: Not on file  Food Insecurity: Not on file  Transportation Needs: Not on file  Physical Activity: Not on file  Stress: Not on file  Social Connections: Not on file  Intimate Partner Violence: Not on file    Past Surgical History:  Procedure Laterality Date   COLONOSCOPY  06/19/2006   COLONOSCOPY WITH PROPOFOL  09/28/2016   INGUINAL HERNIA REPAIR     LUMBAR LAMINECTOMY     x 2   POLYPECTOMY     TRIGGER FINGER RELEASE Right 10/04/2017   Procedure: RELEASE TRIGGER THUMB, RIGHT;  Surgeon: Marchia Bond, MD;   Location: [facility];  Service: Orthopedics;  Laterality: Right;    Family History  Problem Relation Age of Onset   Diabetes Father    Breast cancer Mother    Stroke Other 14       F   Colon cancer Neg Hx    Colon polyps Neg Hx    Esophageal cancer Neg Hx    Rectal cancer Neg Hx    Stomach cancer Neg Hx     No Known Allergies  Current Outpatient Medications on File Prior to Visit  Medication Sig Dispense Refill   atorvastatin (LIPITOR) 40 MG tablet TAKE 1 TABLET BY MOUTH ONCE DAILY FOR CHOLESTEROL 90 tablet 3   CONTOUR NEXT TEST test strip Use as instructed 100 strip 2   fenofibrate 160 MG tablet TAKE 1 TABLET(160 MG) BY MOUTH DAILY FOR CHOLESTEROL 90 tablet 3   glipiZIDE (GLUCOTROL) 10 MG tablet TAKE 1 TABLET BY MOUTH TWICE DAILY BEFORE A MEAL FOR DIABETES 180 tablet 1   insulin glargine (LANTUS SOLOSTAR) 100 UNIT/ML Solostar Pen ADMINISTER 45 UNITS UNDER THE SKIN AT BEDTIME for diabetes. 30 mL 3   Insulin Pen Needle (CAREFINE PEN NEEDLES) 31G X 8 MM MISC Use with insulin once nightly at bedtime. 100 each 3   Lancets MISC Use as instructed to test blood sugar daily 100 each 0   losartan (COZAAR) 100 MG tablet TAKE 1 TABLET BY MOUTH EVERY DAY FOR BLOOD PRESSURE 90 tablet 3   metFORMIN (GLUCOPHAGE) 1000 MG tablet TAKE 1 TABLET BY MOUTH TWICE DAILY WITH MEAL FOR DIABETES 180 tablet 3   tamsulosin (FLOMAX) 0.4 MG CAPS capsule TAKE 1 CAPSULE BY MOUTH DAILY AFTER BREAKFAST, FOR URINE FLOW 90 capsule 3   Dulaglutide (TRULICITY) 4.09 BD/5.3GD SOPN Inject 0.75 mg into the skin once a week. For diabetes. (Patient not taking: Reported on 01/28/2021) 6 mL 1   No current facility-administered medications on file prior to visit.    BP (!) 118/58   Pulse 100   Temp 97.9 F (36.6 C) (Temporal)   Ht 5\' 11"  (1.803 m)   Wt 197 lb (89.4 kg)   SpO2 97%   BMI 27.48 kg/m  Objective:   Physical Exam Cardiovascular:     Rate and Rhythm: Normal rate and regular rhythm.   Pulmonary:     Effort: Pulmonary effort is normal.     Breath sounds: Normal breath sounds. No wheezing or rales.  Musculoskeletal:     Cervical back: Neck supple.  Skin:    General: Skin is warm and dry.  Comments: Minimal peeling to finger tips of left hand, 5th, 4th and 1st digits. No erythema. No warmth or swelling.   Neurological:     Mental Status: He is alert and oriented to person, place, and time.  Psychiatric:        Mood and Affect: Mood normal.          Assessment & Plan:      This visit occurred during the SARS-CoV-2 public health emergency.  Safety protocols were in place, including screening questions prior to the visit, additional usage of staff PPE, and extensive cleaning of exam room while observing appropriate contact time as indicated for disinfecting solutions.

## 2021-01-28 NOTE — Assessment & Plan Note (Addendum)
Glucose levels seem high, he is not checking daily despite numerous recommendations.  Encouraged daily checks. Continue Glipizide 10 mg, Lantus 45 units, metformin 1282 mg BID, trulicity 0.81 mg weekly

## 2021-01-28 NOTE — Assessment & Plan Note (Addendum)
Acute for the last two months.  Differentials include sleep apnea, metabolic cause, diabetes.   Checking labs today including TSH, BMP, CBC. A1C 6 weeks ago was 7.4. PSA in 5 months ago within normal range.   ECG today with NSR with rate of 92. No PAC/PVC or ST changes. Appears very similar to ECG from 2019.  Epworth Sleepiness Score of 6, although I do still suspect underlying sleep apnea. He declines for now, may need to consult with pulmonology

## 2021-01-28 NOTE — Patient Instructions (Signed)
Stop by the lab prior to leaving today. I will notify you of your results once received.   Come by Monday next week for the chest xray. You do not need an appointment.  Start checking your blood sugar levels everyday.  Appropriate times to check your blood sugar levels are:  -Before any meal (breakfast, lunch, dinner) -Two hours after any meal (breakfast, lunch, dinner) -Bedtime  Consider a sleep study.  It was a pleasure to see you today!

## 2021-01-28 NOTE — Assessment & Plan Note (Signed)
Well controlled today. Continue losartan 100 mg

## 2021-01-28 NOTE — Assessment & Plan Note (Signed)
Noted on exam. Patient never scheduled echo cardiogram that was ordered several times.   Consider adding once labs return.

## 2021-01-28 NOTE — Assessment & Plan Note (Signed)
Checking chest xray given fatigue and prior tobacco abuse history.

## 2021-01-29 LAB — HEPATITIS C ANTIBODY
Hepatitis C Ab: NONREACTIVE
SIGNAL TO CUT-OFF: 0.01 (ref <1.00)

## 2021-02-02 ENCOUNTER — Telehealth: Payer: Self-pay | Admitting: Primary Care

## 2021-02-02 ENCOUNTER — Ambulatory Visit
Admission: RE | Admit: 2021-02-02 | Discharge: 2021-02-02 | Disposition: A | Discharge status: Home / Self Care | Payer: Medicare Other | Attending: Internal Medicine | Admitting: Internal Medicine

## 2021-02-02 ENCOUNTER — Ambulatory Visit
Admission: RE | Admit: 2021-02-02 | Discharge: 2021-02-02 | Disposition: A | Discharge status: Home / Self Care | Payer: Medicare Other | Source: Ambulatory Visit | Attending: Primary Care | Admitting: Primary Care

## 2021-02-02 ENCOUNTER — Other Ambulatory Visit: Payer: Self-pay

## 2021-02-02 DIAGNOSIS — R5383 Other fatigue: Secondary | ICD-10-CM | POA: Insufficient documentation

## 2021-02-02 DIAGNOSIS — R634 Abnormal weight loss: Secondary | ICD-10-CM | POA: Diagnosis not present

## 2021-02-02 DIAGNOSIS — F17200 Nicotine dependence, unspecified, uncomplicated: Secondary | ICD-10-CM

## 2021-02-02 NOTE — Telephone Encounter (Signed)
Pt came in office stating he has a chest xray scheduled and I informed pt that the xray machine is down and we could not do xrays at this time.I informed pt that the provider can put in a request to have it done at another location either [facility] or a [place] location this week. Pt stated that he is going out of town  tomorrow evening 02/03/21 and would like to have it done before he leaves.

## 2021-02-02 NOTE — Telephone Encounter (Signed)
Pt came to clinic today for x-ray but x-ray is under repair. Advised pt there is not estimate when x-ray will be repaired and gave other locations for pt to receive x-ray. Pt will go to [place]. Apologized pt was not contacted before coming into clinic. Pt appreciative.

## 2021-02-03 ENCOUNTER — Telehealth: Payer: Self-pay | Admitting: *Deleted

## 2021-02-03 NOTE — Telephone Encounter (Signed)
Patient left a voicemail stating that he had an x-ray done yesterday and would like the results. Patient stated that he is leaving to go out of town today at 4:00 pm and will not be back until Sunday. Patient wants a call back with the results if someone needs to talk with him today.

## 2021-02-03 NOTE — Telephone Encounter (Signed)
Called patient let know results have not been reviewed but I will call on cell phone with any information. Patient agreed with that and just wanted to make sure we would call him when received.

## 2021-02-05 ENCOUNTER — Other Ambulatory Visit: Payer: Self-pay | Admitting: Primary Care

## 2021-02-05 DIAGNOSIS — R5383 Other fatigue: Secondary | ICD-10-CM

## 2021-02-05 DIAGNOSIS — R42 Dizziness and giddiness: Secondary | ICD-10-CM

## 2021-02-05 DIAGNOSIS — R011 Cardiac murmur, unspecified: Secondary | ICD-10-CM

## 2021-03-02 ENCOUNTER — Other Ambulatory Visit: Payer: Self-pay

## 2021-03-02 ENCOUNTER — Ambulatory Visit (HOSPITAL_COMMUNITY): Payer: Medicare Other | Attending: Internal Medicine

## 2021-03-02 ENCOUNTER — Telehealth: Payer: Self-pay | Admitting: Family Medicine

## 2021-03-02 DIAGNOSIS — R011 Cardiac murmur, unspecified: Secondary | ICD-10-CM | POA: Diagnosis not present

## 2021-03-02 DIAGNOSIS — R931 Abnormal findings on diagnostic imaging of heart and coronary circulation: Secondary | ICD-10-CM

## 2021-03-02 DIAGNOSIS — R5383 Other fatigue: Secondary | ICD-10-CM | POA: Insufficient documentation

## 2021-03-02 DIAGNOSIS — R42 Dizziness and giddiness: Secondary | ICD-10-CM

## 2021-03-02 DIAGNOSIS — I421 Obstructive hypertrophic cardiomyopathy: Secondary | ICD-10-CM

## 2021-03-02 LAB — ECHOCARDIOGRAM COMPLETE
AV Mean grad: 8.5 mmHg
AV Peak grad: 14.2 mmHg
Ao pk vel: 1.89 m/s
Area-P 1/2: 3.01 cm2
S' Lateral: 2.8 cm

## 2021-03-02 MED ORDER — PERFLUTREN LIPID MICROSPHERE
1.0000 mL | INTRAVENOUS | Status: AC | PRN
  Administered 2021-03-02: 1 mL via INTRAVENOUS

## 2021-03-02 NOTE — Telephone Encounter (Signed)
Please call pt to let him know his ECHO showed some signs of cardiomyopathy and enlarged heart.   As such I think he should see Cardiology  I would advise against strenous exercise or activity until he sees them. If he develops any chest pain or shortness of breath with activity he should stop immediately.   Walking or mild exercise (as long as no symptoms should be fine).

## 2021-03-03 NOTE — Telephone Encounter (Signed)
Patient left a voicemail requesting a call back about his test results.

## 2021-03-04 NOTE — Telephone Encounter (Signed)
Referral placed.

## 2021-03-04 NOTE — Telephone Encounter (Signed)
Called patient reviewed information. He does not have Cardiology. Would like referral per patient request have mailed copy of results to home address.

## 2021-03-27 ENCOUNTER — Telehealth: Payer: Self-pay | Admitting: *Deleted

## 2021-03-27 NOTE — Telephone Encounter (Signed)
Spoke with the patient and explained the recommendations. Rescheduled PV and colonoscopy for november. Patient aware to get cardiac clearance at office visit before colonoscopy can be done.

## 2021-03-27 NOTE — Telephone Encounter (Signed)
John,  Please review echo. 02/2021. Patient was referred to see cardiologist, appointment is after colonoscopy. Okay to proceed with colonoscopy as scheduled on 04/27/21 at [facility]? Thanks, Hasna Stefanik pv

## 2021-04-27 ENCOUNTER — Encounter: Payer: Medicare Other | Admitting: Gastroenterology

## 2021-05-04 ENCOUNTER — Other Ambulatory Visit: Payer: Self-pay | Admitting: Primary Care

## 2021-05-04 DIAGNOSIS — E119 Type 2 diabetes mellitus without complications: Secondary | ICD-10-CM

## 2021-05-11 ENCOUNTER — Ambulatory Visit (INDEPENDENT_AMBULATORY_CARE_PROVIDER_SITE_OTHER): Payer: Medicare Other

## 2021-05-11 ENCOUNTER — Other Ambulatory Visit (HOSPITAL_BASED_OUTPATIENT_CLINIC_OR_DEPARTMENT_OTHER): Payer: Self-pay | Admitting: Cardiology

## 2021-05-11 ENCOUNTER — Other Ambulatory Visit: Payer: Self-pay

## 2021-05-11 ENCOUNTER — Ambulatory Visit (HOSPITAL_BASED_OUTPATIENT_CLINIC_OR_DEPARTMENT_OTHER): Payer: Medicare Other | Admitting: Cardiology

## 2021-05-11 ENCOUNTER — Encounter (HOSPITAL_BASED_OUTPATIENT_CLINIC_OR_DEPARTMENT_OTHER): Payer: Self-pay | Admitting: Cardiology

## 2021-05-11 ENCOUNTER — Other Ambulatory Visit (HOSPITAL_BASED_OUTPATIENT_CLINIC_OR_DEPARTMENT_OTHER): Payer: Self-pay | Admitting: *Deleted

## 2021-05-11 VITALS — BP 136/60 | HR 99 | Ht 71.0 in | Wt 205.4 lb

## 2021-05-11 DIAGNOSIS — R5383 Other fatigue: Secondary | ICD-10-CM

## 2021-05-11 DIAGNOSIS — I421 Obstructive hypertrophic cardiomyopathy: Secondary | ICD-10-CM

## 2021-05-11 DIAGNOSIS — R42 Dizziness and giddiness: Secondary | ICD-10-CM

## 2021-05-11 DIAGNOSIS — I10 Essential (primary) hypertension: Secondary | ICD-10-CM

## 2021-05-11 DIAGNOSIS — Z01810 Encounter for preprocedural cardiovascular examination: Secondary | ICD-10-CM | POA: Diagnosis not present

## 2021-05-11 DIAGNOSIS — Z01812 Encounter for preprocedural laboratory examination: Secondary | ICD-10-CM

## 2021-05-11 LAB — CBC
Hematocrit: 41.2 % (ref 37.5–51.0)
Hemoglobin: 13.6 g/dL (ref 13.0–17.7)
MCH: 27.5 pg (ref 26.6–33.0)
MCHC: 33 g/dL (ref 31.5–35.7)
MCV: 83 fL (ref 79–97)
Platelets: 381 10*3/uL (ref 150–450)
RBC: 4.94 x10E6/uL (ref 4.14–5.80)
RDW: 13.3 % (ref 11.6–15.4)
WBC: 7.5 10*3/uL (ref 3.4–10.8)

## 2021-05-11 MED ORDER — METOPROLOL SUCCINATE ER 50 MG PO TB24: 50.0000 mg | ORAL_TABLET | Freq: Every day | ORAL | 3 refills | Status: DC

## 2021-05-11 NOTE — Patient Instructions (Addendum)
Medication Instructions:  Please start Metoprolol Succinate 50 mg a day. Continue all other medications as listed.  *If you need a refill on your cardiac medications before your next appointment, please call your pharmacy*  Lab Work: Please have blood work today (BMP, CBC) here in the 3rd floor.  If you have labs (blood work) drawn today and your tests are completely normal, you will receive your results only by: [place] (if you have MyChart) OR A paper copy in the mail If you have any lab test that is abnormal or we need to change your treatment, we will call you to review the results.  Testing/Procedures: Your physician has requested that you have a cardiac MRI. Cardiac MRI uses a computer to create images of your heart as its beating, producing both still and moving pictures of your heart and major blood vessels. For further information please visit [email or web address] Please follow the instruction sheet given to you today for more information.    You are scheduled for Cardiac MRI on ______________. Please arrive at the [facility] main entrance of [facility] at ________________ (30-45 minutes prior to test start time). ?  [facility] [address] [place], [place] [address] [phone]  Please take advantage of the free valet parking available at the MAIN entrance (A entrance). Proceed to the [facility] Radiology Department (First Floor). ? Magnetic resonance imaging (MRI) is a painless test that produces images of the inside of the body without using Xrays.  During an MRI, strong magnets and radio waves work together in a [employer], [employer] party to form detailed images.   MRI images may provide more details about a medical condition than X-rays, CT scans, and ultrasounds can provide.  You may be given earphones to listen for instructions.  You may eat a light breakfast and take medications as ordered with the exception of HCTZ (fluid pill, other).  Please avoid stimulants for 12 hr prior to test. (Ie. Caffeine, nicotine, chocolate, or antihistamine medications)  If a contrast material will be used, an IV will be inserted into one of your veins. Contrast material will be injected into your IV. It will leave your body through your urine within a day. You may be told to drink plenty of fluids to help flush the contrast material out of your system.  You will be asked to remove all metal, including: Watch, jewelry, and other metal objects including hearing aids, hair pieces and dentures. Also wearable glucose monitoring systems (ie. Freestyle Libre and Omnipods) (Braces and fillings normally are not a problem.)   TEST WILL TAKE APPROXIMATELY 1 HOUR  PLEASE NOTIFY SCHEDULING AT LEAST 24 HOURS IN ADVANCE IF YOU ARE UNABLE TO KEEP YOUR APPOINTMENT. [phone]  Please call Marchia Bond, cardiac imaging nurse navigator with any questions/concerns. Marchia Bond RN Navigator Cardiac Imaging Gordy Clement RN Navigator Cardiac Imaging [facility] [phone] Office    ZIO XT- Long Term Monitor Instructions  Your physician has requested you wear a ZIO patch monitor for 14 days.  This is a single patch monitor. [employer] supplies one patch monitor per enrollment. Additional stickers are not available. Please do not apply patch if you will be having a Nuclear Stress Test,  Echocardiogram, Cardiac CT, MRI, or Chest Xray during the period you would be wearing the  monitor. The patch cannot be worn during these tests. You cannot remove and re-apply the  ZIO XT patch monitor.  Your ZIO patch monitor will be  mailed 3 day USPS to your address on file. It may take 3-5 days  to receive your monitor after you have been enrolled.  Once you have received your monitor, please review the enclosed instructions. Your monitor  has already been registered assigning a specific monitor serial # to you.  Billing and Patient Assistance  Program Information  We have supplied [employer] with any of your insurance information on file for billing purposes. [employer] offers a sliding scale Patient Assistance Program for patients that do not have  insurance, or whose insurance does not completely cover the cost of the ZIO monitor.  You must apply for the Patient Assistance Program to qualify for this discounted rate.  To apply, please call [employer] at [phone], select option 4, select option 2, ask to apply for  Patient Assistance Program. Theodore Demark will ask your household income, and how many people  are in your household. They will quote your out-of-pocket cost based on that information.  [employer] will also be able to set up a 77-month, interest-free payment plan if needed.  Applying the monitor   Shave hair from upper left chest.  Hold abrader disc by orange tab. Rub abrader in 40 strokes over the upper left chest as  indicated in your monitor instructions.  Clean area with 4 enclosed alcohol pads. Let dry.  Apply patch as indicated in monitor instructions. Patch will be placed under collarbone on left  side of chest with arrow pointing upward.  Rub patch adhesive wings for 2 minutes. Remove white label marked "1". Remove the white  label marked "2". Rub patch adhesive wings for 2 additional minutes.  While looking in a mirror, press and release button in center of patch. A small green light will  flash 3-4 times. This will be your only indicator that the monitor has been turned on.  Do not shower for the first 24 hours. You may shower after the first 24 hours.  Press the button if you feel a symptom. You will hear a small click. Record Date, Time and  Symptom in the Patient Logbook.  When you are ready to remove the patch, follow instructions on the last 2 pages of Patient  Logbook. Stick patch monitor onto the last page of Patient Logbook.  Place Patient Logbook in the blue and white box. Use locking tab on box and tape box  closed  securely. The blue and white box has prepaid postage on it. Please place it in the mailbox as  soon as possible. Your physician should have your test results approximately 7 days after the  monitor has been mailed back to [facility]  Call [place] at [phone] if you have questions regarding  your ZIO XT patch monitor. Call them immediately if you see an orange light blinking on your  monitor.  If your monitor falls off in less than 4 days, contact our Monitor department at [phone].  If your monitor becomes loose or falls off after 4 days call [employer] at [phone] for  suggestions on securing your monitor  Follow-Up: At [facility], you and your health needs are our priority.  As part of our continuing mission to provide you with exceptional heart care, we have created designated Provider Care Teams.  These Care Teams include your primary Cardiologist (physician) and Advanced Practice Providers (APPs -  Physician Assistants and Nurse Practitioners) who all work together to provide you with the care you need, when you need it.  We recommend signing up  for the patient portal called "MyChart".  Sign up information is provided on this After Visit Summary.  MyChart is used to connect with patients for Virtual Visits (Telemedicine).  Patients are able to view lab/test results, encounter notes, upcoming appointments, etc.  Non-urgent messages can be sent to your provider as well.   To learn more about what you can do with MyChart, go to [email or web address].    Your next appointment:   1 month(s) (after Cardiac MRI and Zio)  The format for your next appointment:   In Person  Provider:   Candee Furbish, MD  Thank you for choosing [facility]!!

## 2021-05-11 NOTE — Progress Notes (Signed)
Cardiology Office Note:    Date:  05/11/2021   ID:  Christian Petty, DOB 03/10/1956, MRN [id number]  PCP:  Pleas Koch, NP   [facility] Providers Cardiologist:  None     Referring MD: Lesleigh Noe, MD   History of Present Illness:    Christian Petty is a 64 y.o. male here for pre-surgical clearance for colonoscopy, and to discuss his abnormal echocardiogram (03/02/2021).  His echocardiogram showed hyperdynamic ejection fraction of 75% with outflow tract gradient of approximately 40 mmHg compatible with hypertrophic cardiomyopathy.  His LV wall thickness of 1.4 cm.  Today he is accompanied by his wife.  Sometimes he has dizziness after standing too fast, or even after sitting down. He noticed blurry vision as well after sitting down.  Lately he also has noticed becoming short of breath more quickly, with minimal exertion.  Quit smoking 15 years ago. Uses a vape with no nicotine.  Of note, he works as a Administrator, and is often driving. He is not able to participate in much formal exercise.  He denies any palpitations, or chest pain. No headaches, syncope, orthopnea, or PND. Also has no lower extremity edema.   Past Medical History:  Diagnosis Date   Allergy    Arthritis    BPH (benign prostatic hyperplasia)    Chronic back pain    HTN (hypertension)    states under control with med., has been on med. x 2-3 yr.   Hyperlipidemia, mild    Insulin dependent diabetes mellitus    Trigger thumb of left hand 10/2017    Past Surgical History:  Procedure Laterality Date   COLONOSCOPY  06/19/2006   COLONOSCOPY WITH PROPOFOL  09/28/2016   INGUINAL HERNIA REPAIR     LUMBAR LAMINECTOMY     x 2   POLYPECTOMY     TRIGGER FINGER RELEASE Right 10/04/2017   Procedure: RELEASE TRIGGER THUMB, RIGHT;  Surgeon: Marchia Bond, MD;  Location: [facility];  Service: Orthopedics;  Laterality: Right;    Current Medications: Current Meds  Medication Sig   atorvastatin  (LIPITOR) 40 MG tablet TAKE 1 TABLET BY MOUTH ONCE DAILY FOR CHOLESTEROL   CONTOUR NEXT TEST test strip Use as instructed   Dulaglutide (TRULICITY) 0.53 ZJ/6.7HA SOPN Inject 0.75 mg into the skin once a week. For diabetes.   fenofibrate 160 MG tablet TAKE 1 TABLET(160 MG) BY MOUTH DAILY FOR CHOLESTEROL   glipiZIDE (GLUCOTROL) 10 MG tablet TAKE 1 TABLET BY MOUTH TWICE DAILY BEFORE A MEAL FOR DIABETES   insulin glargine (LANTUS SOLOSTAR) 100 UNIT/ML Solostar Pen ADMINISTER 45 UNITS UNDER THE SKIN AT BEDTIME for diabetes.   Insulin Pen Needle (CAREFINE PEN NEEDLES) 31G X 8 MM MISC Use with insulin once nightly at bedtime.   Lancets MISC Use as instructed to test blood sugar daily   losartan (COZAAR) 100 MG tablet TAKE 1 TABLET BY MOUTH EVERY DAY FOR BLOOD PRESSURE   metFORMIN (GLUCOPHAGE) 1000 MG tablet TAKE 1 TABLET BY MOUTH TWICE DAILY WITH MEAL FOR DIABETES   metoprolol succinate (TOPROL-XL) 50 MG 24 hr tablet Take 1 tablet (50 mg total) by mouth daily. Take with or immediately following a meal.   tamsulosin (FLOMAX) 0.4 MG CAPS capsule TAKE 1 CAPSULE BY MOUTH DAILY AFTER BREAKFAST, FOR URINE FLOW     Allergies:   Patient has no known allergies.   Social History   Socioeconomic History   Marital status: Married    Spouse name: Not on file  Number of children: 2   Years of education: Not on file   Highest education level: Not on file  Occupational History   Occupation: truck driver   Tobacco Use   Smoking status: Former    Packs/day: 0.00    Types: Cigarettes    Quit date: 11/16/2010    Years since quitting: 10.4   Smokeless tobacco: Never  Vaping Use   Vaping Use: Never used  Substance and Sexual Activity   Alcohol use: Yes    Comment: occasionally   Drug use: No   Sexual activity: Not on file  Other Topics Concern   Not on file  Social History Narrative   Married.   2 children, 2 grandchildren.   Works for himself as a Administrator.   Social Determinants of Health    Financial Resource Strain: Not on file  Food Insecurity: Not on file  Transportation Needs: Not on file  Physical Activity: Not on file  Stress: Not on file  Social Connections: Not on file     Family History: The patient's family history includes Breast cancer in his mother; Diabetes in his father; Stroke (age of onset: 43) in an other family member. There is no history of Colon cancer, Colon polyps, Esophageal cancer, Rectal cancer, or Stomach cancer.  ROS:   Please see the history of present illness.    (+) Shortness of breath (+) Dizziness (+) Blurry vision All other systems reviewed and are negative.  EKGs/Labs/Other Studies Reviewed:    The following studies were reviewed today:  Echo 03/02/2021:  1. LV systolic function is vigorous Turbulent flow through narrow LVOT.  With Valsalva, peak gradient through LV/LVOT/AV is 40 mm Hg, consistent  with HOCM physiology. Left ventricular ejection fraction, by estimation,  is >75%. The left ventricle has  hyperdynamic function. There is mild left ventricular hypertrophy. Left  ventricular diastolic parameters are consistent with Grade I diastolic  dysfunction (impaired relaxation).   2. Right ventricular systolic function is normal. The right ventricular  size is normal.   3. Systolic anterior motion of anterior mitral leaflet. . Trivial mitral  valve regurgitation.   4. The aortic valve is tricuspid. Aortic valve regurgitation is not  visualized. Mild to moderate aortic valve sclerosis/calcification is  present, without any evidence of aortic stenosis.   EKG:  EKG is personally reviewed and interpreted. 05/11/2021: Sinus rhythm. Rate 99 bpm. Left posterior fascicular block. T wave inversion inferior leads.  Recent Labs: 09/09/2020: ALT 17 01/28/2021: BUN 31; Creatinine, Ser 1.38; Hemoglobin 11.2; Platelets 385.0; Potassium 4.6; Sodium 139; TSH 1.47   Recent Lipid Panel    Component Value Date/Time   CHOL 132 09/09/2020  1107   TRIG 191.0 (H) 09/09/2020 1107   TRIG 225 (HH) 07/04/2006 1002   HDL 23.60 (L) 09/09/2020 1107   CHOLHDL 6 09/09/2020 1107   VLDL 38.2 09/09/2020 1107   LDLCALC 70 09/09/2020 1107   LDLDIRECT 100.0 06/07/2019 1201     Risk Assessment/Calculations:           Physical Exam:    VS:  BP 136/60   Pulse 99   Ht 5\' 11"  (1.803 m)   Wt 205 lb 6.4 oz (93.2 kg)   SpO2 95%   BMI 28.65 kg/m     Wt Readings from Last 3 Encounters:  05/11/21 205 lb 6.4 oz (93.2 kg)  01/28/21 197 lb (89.4 kg)  12/09/20 201 lb (91.2 kg)     GEN: Well nourished, well developed in no  acute distress HEENT: Normal NECK: No JVD; No carotid bruits LYMPHATICS: No lymphadenopathy CARDIAC: RRR, 3/6 systolic murmur, no rubs, no gallops RESPIRATORY:  Clear to auscultation without rales, wheezing or rhonchi  ABDOMEN: Soft, non-tender, non-distended MUSCULOSKELETAL:  No edema; No deformity  SKIN: Warm and dry NEUROLOGIC:  Alert and oriented x 3 PSYCHIATRIC:  Normal affect   ASSESSMENT:    1. Essential hypertension   2. HOCM (hypertrophic obstructive cardiomyopathy) ([place])   3. Pre-procedure lab exam    PLAN:    In order of problems listed above:  HOCM (hypertrophic obstructive cardiomyopathy) ([place]) Echocardiogram reviewed from 03/02/2021.  Turbulent flow through a narrow left ventricular outflow tract with peak gradient of 40 mmHg consistent with hypertrophic obstructive cardiomyopathy physiology.  His wall thickness however is measured at 1.4 cm which is not severe.  He has had no early family history of sudden cardiac death.  No unexplained syncope.  No chest pain no shortness of breath. We will start beta-blocker, Toprol-XL 50 mg once a day.  Explained the importance of maintaining good hydration.  We will check a cardiac MRI for further evaluation of his HOCM.  While wearing the ZIO monitor, I have asked him to participate in some exercise.  We want to make sure that there are no adverse  arrhythmias present.  Further testing may include coronary evaluation given his ongoing dyspnea on exertion.  We will take 1 step at a time.  Diabetes mellitus with coincident hypertension (HCC) Currently on atorvastatin 40 mg.  Excellent with his diabetes.  No changes made.  Per primary team.  I will see him back in 1 month post testing.   Follow-up: 1 month.  Medication Adjustments/Labs and Tests Ordered: Current medicines are reviewed at length with the patient today.  Concerns regarding medicines are outlined above.  Orders Placed This Encounter  Procedures   MR CARDIAC MORPHOLOGY W WO CONTRAST   CBC   Basic metabolic panel   EKG [id number]     Meds ordered this encounter  Medications   metoprolol succinate (TOPROL-XL) 50 MG 24 hr tablet    Sig: Take 1 tablet (50 mg total) by mouth daily. Take with or immediately following a meal.    Dispense:  90 tablet    Refill:  3     Patient Instructions  Medication Instructions:  Please start Metoprolol Succinate 50 mg a day. Continue all other medications as listed.  *If you need a refill on your cardiac medications before your next appointment, please call your pharmacy*  Lab Work: Please have blood work today (BMP, CBC) here in the 3rd floor.  If you have labs (blood work) drawn today and your tests are completely normal, you will receive your results only by: [place] (if you have MyChart) OR A paper copy in the mail If you have any lab test that is abnormal or we need to change your treatment, we will call you to review the results.  Testing/Procedures: Your physician has requested that you have a cardiac MRI. Cardiac MRI uses a computer to create images of your heart as its beating, producing both still and moving pictures of your heart and major blood vessels. For further information please visit [email or web address] Please follow the instruction sheet given to you today for more information.    You are scheduled  for Cardiac MRI on ______________. Please arrive at the [facility] main entrance of [facility] at ________________ (30-45 minutes prior to test start time). ?  [facility] [place], [place] [address] [phone]  Please take advantage of the free valet parking available at the MAIN entrance (A entrance). Proceed to the [facility] Radiology Department (First Floor). ? Magnetic resonance imaging (MRI) is a painless test that produces images of the inside of the body without using Xrays.  During an MRI, strong magnets and radio waves work together in a Research officer, political party to form detailed images.   MRI images may provide more details about a medical condition than X-rays, CT scans, and ultrasounds can provide.  You may be given earphones to listen for instructions.  You may eat a light breakfast and take medications as ordered with the exception of HCTZ (fluid pill, other). Please avoid stimulants for 12 hr prior to test. (Ie. Caffeine, nicotine, chocolate, or antihistamine medications)  If a contrast material will be used, an IV will be inserted into one of your veins. Contrast material will be injected into your IV. It will leave your body through your urine within a day. You may be told to drink plenty of fluids to help flush the contrast material out of your system.  You will be asked to remove all metal, including: Watch, jewelry, and other metal objects including hearing aids, hair pieces and dentures. Also wearable glucose monitoring systems (ie. Freestyle Libre and Omnipods) (Braces and fillings normally are not a problem.)   TEST WILL TAKE APPROXIMATELY 1 HOUR  PLEASE NOTIFY SCHEDULING AT LEAST 24 HOURS IN ADVANCE IF YOU ARE UNABLE TO KEEP YOUR APPOINTMENT. [phone]  Please call Marchia Bond, cardiac imaging nurse navigator with any questions/concerns. Marchia Bond RN Navigator Cardiac Imaging Gordy Clement RN Navigator Cardiac  Imaging [facility] [phone] Office    ZIO XT- Long Term Monitor Instructions  Your physician has requested you wear a ZIO patch monitor for 14 days.  This is a single patch monitor. [employer] supplies one patch monitor per enrollment. Additional stickers are not available. Please do not apply patch if you will be having a Nuclear Stress Test,  Echocardiogram, Cardiac CT, MRI, or Chest Xray during the period you would be wearing the  monitor. The patch cannot be worn during these tests. You cannot remove and re-apply the  ZIO XT patch monitor.  Your ZIO patch monitor will be mailed 3 day USPS to your address on file. It may take 3-5 days  to receive your monitor after you have been enrolled.  Once you have received your monitor, please review the enclosed instructions. Your monitor  has already been registered assigning a specific monitor serial # to you.  Billing and Patient Assistance Program Information  We have supplied [employer] with any of your insurance information on file for billing purposes. [employer] offers a sliding scale Patient Assistance Program for patients that do not have  insurance, or whose insurance does not completely cover the cost of the ZIO monitor.  You must apply for the Patient Assistance Program to qualify for this discounted rate.  To apply, please call [employer] at [phone], select option 4, select option 2, ask to apply for  Patient Assistance Program. Theodore Demark will ask your household income, and how many people  are in your household. They will quote your out-of-pocket cost based on that information.  [employer] will also be able to set up a 4-month, interest-free payment plan if needed.  Applying the monitor   Shave hair from upper left chest.  Hold abrader disc by orange tab. Rub  abrader in 40 strokes over the upper left chest as  indicated in your monitor instructions.  Clean area with 4 enclosed alcohol pads. Let dry.   Apply patch as indicated in monitor instructions. Patch will be placed under collarbone on left  side of chest with arrow pointing upward.  Rub patch adhesive wings for 2 minutes. Remove white label marked "1". Remove the white  label marked "2". Rub patch adhesive wings for 2 additional minutes.  While looking in a mirror, press and release button in center of patch. A small green light will  flash 3-4 times. This will be your only indicator that the monitor has been turned on.  Do not shower for the first 24 hours. You may shower after the first 24 hours.  Press the button if you feel a symptom. You will hear a small click. Record Date, Time and  Symptom in the Patient Logbook.  When you are ready to remove the patch, follow instructions on the last 2 pages of Patient  Logbook. Stick patch monitor onto the last page of Patient Logbook.  Place Patient Logbook in the blue and white box. Use locking tab on box and tape box closed  securely. The blue and white box has prepaid postage on it. Please place it in the mailbox as  soon as possible. Your physician should have your test results approximately 7 days after the  monitor has been mailed back to [facility].  Call [facility] at [phone] if you have questions regarding  your ZIO XT patch monitor. Call them immediately if you see an orange light blinking on your  monitor.  If your monitor falls off in less than 4 days, contact our Monitor department at [phone].  If your monitor becomes loose or falls off after 4 days call [employer] at [phone] for  suggestions on securing your monitor  Follow-Up: At [facility], you and your health needs are our priority.  As part of our continuing mission to provide you with exceptional heart care, we have created designated Provider Care Teams.  These Care Teams include your primary Cardiologist (physician) and Advanced Practice Providers (APPs -  Physician Assistants  and Nurse Practitioners) who all work together to provide you with the care you need, when you need it.  We recommend signing up for the patient portal called "MyChart".  Sign up information is provided on this After Visit Summary.  MyChart is used to connect with patients for Virtual Visits (Telemedicine).  Patients are able to view lab/test results, encounter notes, upcoming appointments, etc.  Non-urgent messages can be sent to your provider as well.   To learn more about what you can do with MyChart, go to [email or web address].    Your next appointment:   1 month(s) (after Cardiac MRI and Zio)  The format for your next appointment:   In Person  Provider:   Candee Furbish, MD  Thank you for choosing [facility]!!     I,Mathew Stumpf,acting as a scribe for Candee Furbish, MD.,have documented all relevant documentation on the behalf of Candee Furbish, MD,as directed by  Candee Furbish, MD while in the presence of Candee Furbish, MD.  I, Candee Furbish, MD, have reviewed all documentation for this visit. The documentation on 05/11/21 for the exam, diagnosis, procedures, and orders are all accurate and complete.   Signed, Candee Furbish, MD  05/11/2021 5:08 PM    [place]

## 2021-05-11 NOTE — Progress Notes (Unsigned)
Patient enrolled for [employer] to mail a 14 day ZIO XT monitor to his address on file.

## 2021-05-11 NOTE — Assessment & Plan Note (Addendum)
Echocardiogram reviewed from 03/02/2021.  Turbulent flow through a narrow left ventricular outflow tract with peak gradient of 40 mmHg consistent with hypertrophic obstructive cardiomyopathy physiology.  His wall thickness however is measured at 1.4 cm which is not severe.  He has had no early family history of sudden cardiac death.  No unexplained syncope.  No chest pain no shortness of breath. We will start beta-blocker, Toprol-XL 50 mg once a day.  Explained the importance of maintaining good hydration.  We will check a cardiac MRI for further evaluation of his HOCM.  While wearing the ZIO monitor, I have asked him to participate in some exercise.  We want to make sure that there are no adverse arrhythmias present.  Further testing may include coronary evaluation given his ongoing dyspnea on exertion.  We will take 1 step at a time.

## 2021-05-11 NOTE — Assessment & Plan Note (Signed)
Currently on atorvastatin 40 mg.  Excellent with his diabetes.  No changes made.  Per primary team.

## 2021-05-12 LAB — BASIC METABOLIC PANEL
BUN/Creatinine Ratio: 16 (ref 10–24)
BUN: 21 mg/dL (ref 8–27)
CO2: 20 mmol/L (ref 20–29)
Calcium: 10.7 mg/dL — ABNORMAL HIGH (ref 8.6–10.2)
Chloride: 97 mmol/L (ref 96–106)
Creatinine, Ser: 1.35 mg/dL — ABNORMAL HIGH (ref 0.76–1.27)
Glucose: 369 mg/dL — ABNORMAL HIGH (ref 70–99)
Potassium: 4.9 mmol/L (ref 3.5–5.2)
Sodium: 134 mmol/L (ref 134–144)
eGFR: 58 mL/min/{1.73_m2} — ABNORMAL LOW (ref >59)

## 2021-05-20 ENCOUNTER — Encounter: Payer: Self-pay | Admitting: *Deleted

## 2021-05-28 ENCOUNTER — Telehealth: Payer: Self-pay | Admitting: Primary Care

## 2021-05-28 NOTE — Telephone Encounter (Signed)
Left message to return call to our office.  When calls back need to let them know that is something they need to reach out to GI office for. We do not have that information.

## 2021-05-28 NOTE — Telephone Encounter (Signed)
Pt wife called stating that pt is having a colonoscopy on 06/01/21. Pt wife states  that they need the prep for it. Please advise.

## 2021-06-01 ENCOUNTER — Encounter: Payer: Medicare Other | Admitting: Gastroenterology

## 2021-06-02 NOTE — Telephone Encounter (Signed)
Patients appointment has passed with no call back assume that he received information from GI

## 2021-06-05 ENCOUNTER — Telehealth: Payer: Self-pay | Admitting: Cardiology

## 2021-06-05 NOTE — Telephone Encounter (Signed)
Patient's MRI has been changed to December 6th.  I could not find a timely appt to reschedule his appt with Dr Marlou Porch. Patient is having colonoscopy on 12/12. Wife doesn't want him to wait too long to be seen.

## 2021-06-05 NOTE — Telephone Encounter (Signed)
Spoke with the pts wife and advised her that I will forward to Dr. Marlou Porch nurse to review and determine when the pt needs to follow up after his MRI.

## 2021-06-08 NOTE — Telephone Encounter (Signed)
Spoke with wife, Kennyth Lose.  Pt has not placed his zio though he received several days ago.  Pt's cardiac MRI is scheduled for 12/6.  Pt is out of town until Friday or Saturday currently therefore he will wait to place zio until 12/6 after MRI.   Follow up appt with Dr Marlou Porch rescheduled 07/22/2021 at 11:40 am.

## 2021-06-09 ENCOUNTER — Ambulatory Visit (HOSPITAL_COMMUNITY): Admission: RE | Admit: 2021-06-09 | Payer: Medicare Other | Source: Ambulatory Visit

## 2021-06-15 ENCOUNTER — Ambulatory Visit: Payer: Medicare Other | Admitting: Cardiology

## 2021-06-16 ENCOUNTER — Ambulatory Visit: Payer: Medicare Other | Admitting: Primary Care

## 2021-06-16 ENCOUNTER — Telehealth: Payer: Self-pay

## 2021-06-16 NOTE — Telephone Encounter (Signed)
Pt wife called and cancelled appt, pt is out of state and will call back to reschedule

## 2021-06-16 NOTE — Telephone Encounter (Signed)
Noted  

## 2021-06-16 NOTE — Telephone Encounter (Signed)
[place] Night - Client Nonclinical Telephone Record  [facility] Client [facility] Night - Client Client Site [place] - Night Provider Alma Friendly - NP Contact Type Call Who Is Calling Patient / Member / Family / Caregiver Caller Name [place] Phone Number [phone] Patient Name Christian Petty Patient DOB June 19, 1956 Call Type Message Only Information Provided Reason for Call Request to [place] Appointment Initial Comment Caller states her husband has is out of state and needs to cancel appt for today Patient request to speak to RN No Additional Comment hrs prov Disp. Time Disposition Final User 06/16/2021 7:33:50 AM General Information Provided Yes Idolina Primer Call Closed By: Idolina Primer Transaction Date/Time: 06/16/2021 7:31:48 AM (ET

## 2021-06-22 ENCOUNTER — Telehealth (HOSPITAL_COMMUNITY): Payer: Self-pay | Admitting: Emergency Medicine

## 2021-06-22 NOTE — Telephone Encounter (Signed)
Reaching out to patient to offer assistance regarding upcoming cardiac imaging study; pt verbalizes understanding of appt date/time, parking situation and where to check in, , and verified current allergies; name and call back number provided for further questions should they arise Marchia Bond RN Navigator Cardiac Imaging [facility] [phone] office [phone] cell  Pt unsure if hes claustro Denies metal implants

## 2021-06-23 ENCOUNTER — Other Ambulatory Visit: Payer: Self-pay

## 2021-06-23 ENCOUNTER — Ambulatory Visit (HOSPITAL_COMMUNITY)
Admission: RE | Admit: 2021-06-23 | Discharge: 2021-06-23 | Disposition: A | Discharge status: Home / Self Care | Payer: Medicare Other | Source: Ambulatory Visit | Attending: Cardiology | Admitting: Cardiology

## 2021-06-23 ENCOUNTER — Encounter (HOSPITAL_COMMUNITY): Payer: Self-pay

## 2021-06-23 DIAGNOSIS — R5383 Other fatigue: Secondary | ICD-10-CM | POA: Diagnosis not present

## 2021-06-23 DIAGNOSIS — I421 Obstructive hypertrophic cardiomyopathy: Secondary | ICD-10-CM

## 2021-06-23 DIAGNOSIS — R42 Dizziness and giddiness: Secondary | ICD-10-CM | POA: Diagnosis not present

## 2021-06-23 NOTE — Progress Notes (Signed)
MRI attempted on Pt.  He is claustrophobic and was unable to have scan.  Offered  to call and get meds ordered but pt did not wish to continue at this time.

## 2021-06-29 ENCOUNTER — Encounter: Payer: Medicare Other | Admitting: Gastroenterology

## 2021-07-09 NOTE — Progress Notes (Signed)
Sure, if he is willing Christian Furbish, MD

## 2021-07-15 DIAGNOSIS — R5383 Other fatigue: Secondary | ICD-10-CM | POA: Diagnosis not present

## 2021-07-15 DIAGNOSIS — I421 Obstructive hypertrophic cardiomyopathy: Secondary | ICD-10-CM | POA: Diagnosis not present

## 2021-07-15 DIAGNOSIS — R42 Dizziness and giddiness: Secondary | ICD-10-CM | POA: Diagnosis not present

## 2021-07-21 ENCOUNTER — Telehealth: Payer: Self-pay | Admitting: Cardiology

## 2021-07-21 NOTE — Telephone Encounter (Signed)
Spoke with the patient who states that he will be out of town tomorrow and is not able to come in for an appointment. He would like to have a virtual visit with Dr. Marlou Porch instead. Dr. Marlou Porch and his nurse are out of the office today. Appointment changed to virtual but advised patient that we may need to reschedule if Dr. Marlou Porch prefers to see him in the office

## 2021-07-21 NOTE — Telephone Encounter (Signed)
New Message:    Patient is scheduled for an appointment tomorrow with Dr Marlou Porch. 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 He wants to know if he can his results from his Monitor today over the phone please?Marland Kitchen

## 2021-07-22 ENCOUNTER — Other Ambulatory Visit: Payer: Self-pay

## 2021-07-22 ENCOUNTER — Encounter: Payer: Self-pay | Admitting: Cardiology

## 2021-07-22 ENCOUNTER — Telehealth (INDEPENDENT_AMBULATORY_CARE_PROVIDER_SITE_OTHER): Payer: Medicare Other | Admitting: Cardiology

## 2021-07-22 DIAGNOSIS — E7800 Pure hypercholesterolemia, unspecified: Secondary | ICD-10-CM | POA: Diagnosis not present

## 2021-07-22 DIAGNOSIS — I10 Essential (primary) hypertension: Secondary | ICD-10-CM | POA: Diagnosis not present

## 2021-07-22 DIAGNOSIS — I421 Obstructive hypertrophic cardiomyopathy: Secondary | ICD-10-CM

## 2021-07-22 NOTE — Assessment & Plan Note (Signed)
Could not tolerate noise in cardiac MRI/claustrophobia.  This is okay.  If we must obtain MRI in the future, we will give him Valium.  For now we will forego.  He does not wish to proceed.  ZIO monitor overall reassuring no ventricular tachycardia no atrial fibrillation.  Brief atrial tachycardia noted.  Benign.  Rare PVCs.  Continue with Toprol XL 50 mg once a day.  Blood pressure excellent, pulse excellent.  Be careful when getting up from a seated position too quickly.  Hydrate well.  Discussed his overall risk with hypertrophic cardiomyopathy.  Fortunately, he does not meet any significant high risk parameters such as extreme wall thickness, syncope, first-degree relative sudden cardiac death, ventricular tachycardia.

## 2021-07-22 NOTE — Assessment & Plan Note (Signed)
Currently well controlled.  No changes made.  Continue with Toprol 50 and losartan 100 mg

## 2021-07-22 NOTE — Patient Instructions (Signed)
Medication Instructions:  No changes *If you need a refill on your cardiac medications before your next appointment, please call your pharmacy*   Lab Work: none  Testing/Procedures: none   Follow-Up: At [facility], you and your health needs are our priority.  As part of our continuing mission to provide you with exceptional heart care, we have created designated Provider Care Teams.  These Care Teams include your primary Cardiologist (physician) and Advanced Practice Providers (APPs -  Physician Assistants and Nurse Practitioners) who all work together to provide you with the care you need, when you need it.  We recommend signing up for the patient portal called "MyChart".  Sign up information is provided on this After Visit Summary.  MyChart is used to connect with patients for Virtual Visits (Telemedicine).  Patients are able to view lab/test results, encounter notes, upcoming appointments, etc.  Non-urgent messages can be sent to your provider as well.   To learn more about what you can do with MyChart, go to [email or web address].    Your next appointment:   6 month(s)  The format for your next appointment:   In Person  Provider:   One of our Advanced Practice Providers (Nurse Practitioner or Physician's Assistant   Other Instructions

## 2021-07-22 NOTE — Progress Notes (Signed)
Virtual Visit via Video Note   This visit type was conducted due to national recommendations for restrictions regarding the COVID-19 Pandemic (e.g. social distancing) in an effort to limit this patient's exposure and mitigate transmission in our community.  Due to his co-morbid illnesses, this patient is at least at moderate risk for complications without adequate follow up.  This format is felt to be most appropriate for this patient at this time.  All issues noted in this document were discussed and addressed.  A limited physical exam was performed with this format.  Please refer to the patient's chart for his consent to telehealth for [facility]     Date:  07/22/2021   ID:  Christian Petty, DOB December 23, 1955, MRN [id number] The patient was identified using 2 identifiers.  Patient Location: Home Provider Location: Office/Clinic   PCP:  Pleas Koch, NP   [facility] Providers Cardiologist:  Candee Furbish, MD     Evaluation Performed:  Follow-Up Visit  History of Present Illness:    Christian Petty is a 66 y.o. male here for follow-up of HOCM and discussion of Zio and cardiac MRI results. The MRI was unable to be completed due to severe claustrophobia. He did not wish to proceed with the exam.  Previously discussed his abnormal echocardiogram (03/02/2021).  His echocardiogram showed hyperdynamic ejection fraction of 75% with outflow tract gradient of approximately 40 mmHg compatible with hypertrophic cardiomyopathy.  His LV wall thickness of 1.4 cm.  At his last appointment he reported dizziness after standing or sitting too quickly, as well as blurry vision after sitting down. He also noted SOB with minimal exertion.  Quit smoking 15 years ago. Uses a vape with no nicotine.  Of note, he works as a Administrator, and is often driving. This limits his formal exercise.  Today: Overall, he appears well.  He reports that as long as he takes his time with sitting or standing, he will not  suffer from dizziness. He denies any syncopal episodes.  He remains compliant with his medications.  He denies any palpitations, chest pain, or shortness of breath. No headaches, orthopnea, PND, lower extremity edema or exertional symptoms.  The patient does not have symptoms concerning for COVID-19 infection (fever, chills, cough, or new shortness of breath).    Past Medical History:  Diagnosis Date   Allergy    Arthritis    BPH (benign prostatic hyperplasia)    Chronic back pain    HTN (hypertension)    states under control with med., has been on med. x 2-3 yr.   Hyperlipidemia, mild    Insulin dependent diabetes mellitus    Trigger thumb of left hand 10/2017   Past Surgical History:  Procedure Laterality Date   COLONOSCOPY  06/19/2006   COLONOSCOPY WITH PROPOFOL  09/28/2016   INGUINAL HERNIA REPAIR     LUMBAR LAMINECTOMY     x 2   POLYPECTOMY     TRIGGER FINGER RELEASE Right 10/04/2017   Procedure: RELEASE TRIGGER THUMB, RIGHT;  Surgeon: Marchia Bond, MD;  Location: [facility];  Service: Orthopedics;  Laterality: Right;     Current Meds  Medication Sig   atorvastatin (LIPITOR) 40 MG tablet TAKE 1 TABLET BY MOUTH ONCE DAILY FOR CHOLESTEROL   CONTOUR NEXT TEST test strip Use as instructed   Dulaglutide (TRULICITY) 4.81 EH/6.3JS SOPN Inject 0.75 mg into the skin once a week. For diabetes.   fenofibrate 160 MG tablet TAKE 1 TABLET(160 MG) BY MOUTH  DAILY FOR CHOLESTEROL   glipiZIDE (GLUCOTROL) 10 MG tablet TAKE 1 TABLET BY MOUTH TWICE DAILY BEFORE A MEAL FOR DIABETES   insulin glargine (LANTUS SOLOSTAR) 100 UNIT/ML Solostar Pen ADMINISTER 45 UNITS UNDER THE SKIN AT BEDTIME for diabetes.   Insulin Pen Needle (CAREFINE PEN NEEDLES) 31G X 8 MM MISC Use with insulin once nightly at bedtime.   Lancets MISC Use as instructed to test blood sugar daily   losartan (COZAAR) 100 MG tablet TAKE 1 TABLET BY MOUTH EVERY DAY FOR BLOOD PRESSURE   metFORMIN (GLUCOPHAGE) 1000  MG tablet TAKE 1 TABLET BY MOUTH TWICE DAILY WITH MEAL FOR DIABETES   metoprolol succinate (TOPROL-XL) 50 MG 24 hr tablet Take 1 tablet (50 mg total) by mouth daily. Take with or immediately following a meal.   tamsulosin (FLOMAX) 0.4 MG CAPS capsule TAKE 1 CAPSULE BY MOUTH DAILY AFTER BREAKFAST, FOR URINE FLOW     Allergies:   Patient has no allergy information on record.   Social History   Tobacco Use   Smoking status: Former    Packs/day: 0.00    Types: Cigarettes    Quit date: 11/16/2010    Years since quitting: 10.6   Smokeless tobacco: Never  Vaping Use   Vaping Use: Never used  Substance Use Topics   Alcohol use: Yes    Comment: occasionally   Drug use: No     Family Hx: The patient's family history includes Breast cancer in his mother; Diabetes in his father; Stroke (age of onset: 27) in an other family member. There is no history of Colon cancer, Colon polyps, Esophageal cancer, Rectal cancer, or Stomach cancer.  ROS:   Please see the history of present illness.    All other systems reviewed and are negative.   Prior CV studies:   The following studies were reviewed today:  Monitor 2022: No adverse rhythms No VT No AFIB Brief atrial tachycardia - benign   Patch Wear Time:  14 days and 0 hours (2022-12-06T13:46:22-0500 to 2022-12-20T13:46:26-0500)   Patient had a min HR of 57 bpm, max HR of 148 bpm, and avg HR of 86 bpm. Predominant underlying rhythm was Sinus Rhythm. 2 Supraventricular Tachycardia runs occurred, the run with the fastest interval lasting 12 beats with a max rate of 148 bpm (avg 141  bpm); the run with the fastest interval was also the longest. Isolated SVEs were rare (<1.0%), SVE Couplets were rare (<1.0%), and SVE Triplets were rare (<1.0%). Isolated VEs were rare (<1.0%), VE Couplets were rare (<1.0%), and no VE Triplets were  present. Ventricular Bigeminy and Trigeminy were present.   Echo 03/02/2021:  1. LV systolic function is vigorous  Turbulent flow through narrow LVOT.  With Valsalva, peak gradient through LV/LVOT/AV is 40 mm Hg, consistent  with HOCM physiology. Left ventricular ejection fraction, by estimation,  is >75%. The left ventricle has  hyperdynamic function. There is mild left ventricular hypertrophy. Left  ventricular diastolic parameters are consistent with Grade I diastolic  dysfunction (impaired relaxation).   2. Right ventricular systolic function is normal. The right ventricular  size is normal.   3. Systolic anterior motion of anterior mitral leaflet. . Trivial mitral  valve regurgitation.   4. The aortic valve is tricuspid. Aortic valve regurgitation is not  visualized. Mild to moderate aortic valve sclerosis/calcification is  present, without any evidence of aortic stenosis.    Labs/Other Tests and Data Reviewed:    EKG:   EKG is personally reviewed and interpreted.  07/22/2021: EKG was not ordered. 05/11/2021: Sinus rhythm. Rate 99 bpm. Left posterior fascicular block. T wave inversion inferior leads.  Recent Labs: 09/09/2020: ALT 17 01/28/2021: TSH 1.47 05/11/2021: BUN 21; Creatinine, Ser 1.35; Hemoglobin 13.6; Platelets 381; Potassium 4.9; Sodium 134   Recent Lipid Panel Lab Results  Component Value Date/Time   CHOL 132 09/09/2020 11:07 AM   TRIG 191.0 (H) 09/09/2020 11:07 AM   TRIG 225 (HH) 07/04/2006 10:02 AM   HDL 23.60 (L) 09/09/2020 11:07 AM   CHOLHDL 6 09/09/2020 11:07 AM   LDLCALC 70 09/09/2020 11:07 AM   LDLDIRECT 100.0 06/07/2019 12:01 PM    Wt Readings from Last 3 Encounters:  07/22/21 205 lb (93 kg)  05/11/21 205 lb 6.4 oz (93.2 kg)  01/28/21 197 lb (89.4 kg)     Risk Assessment/Calculations:         Objective:    Vital Signs:  BP 113/69    Pulse 77    Ht 5\' 11"  (1.803 m)    Wt 205 lb (93 kg)    BMI 28.59 kg/m    VITAL SIGNS:  reviewed GEN:  no acute distress EYES:  sclerae anicteric, EOMI - Extraocular Movements Intact RESPIRATORY:  normal respiratory effort,  symmetric expansion CARDIOVASCULAR:  no peripheral edema SKIN:  no rash, lesions or ulcers. MUSCULOSKELETAL:  no obvious deformities. NEURO:  alert and oriented x 3, no obvious focal deficit PSYCH:  normal affect   ASSESSMENT & PLAN:    HOCM (hypertrophic obstructive cardiomyopathy) ([place]) Could not tolerate noise in cardiac MRI/claustrophobia.  This is okay.  If we must obtain MRI in the future, we will give him Valium.  For now we will forego.  He does not wish to proceed.  ZIO monitor overall reassuring no ventricular tachycardia no atrial fibrillation.  Brief atrial tachycardia noted.  Benign.  Rare PVCs.  Continue with Toprol XL 50 mg once a day.  Blood pressure excellent, pulse excellent.  Be careful when getting up from a seated position too quickly.  Hydrate well.  Discussed his overall risk with hypertrophic cardiomyopathy.  Fortunately, he does not meet any significant high risk parameters such as extreme wall thickness, syncope, first-degree relative sudden cardiac death, ventricular tachycardia.  Essential hypertension Currently well controlled.  No changes made.  Continue with Toprol 50 and losartan 100 mg  Hyperlipidemia Atorvastatin 40 mg once a day.  LDL 70 at last check.  No myalgias.        COVID-19 Education: The signs and symptoms of COVID-19 were discussed with the patient and how to seek care for testing (follow up with PCP or arrange E-visit).  The importance of social distancing was discussed today.  Time:   Today, I have spent 21 minutes with the patient with telehealth technology discussing the above problems.     Medication Adjustments/Labs and Tests Ordered: Current medicines are reviewed at length with the patient today.  Concerns regarding medicines are outlined above.   Tests Ordered: No orders of the defined types were placed in this encounter.   Medication Changes: No orders of the defined types were placed in this encounter.   Follow  Up:  In Person in 6 month(s) with APP  I,Mathew Stumpf,acting as a scribe for [employer], MD.,have documented all relevant documentation on the behalf of Candee Furbish, MD,as directed by  Candee Furbish, MD while in the presence of Candee Furbish, MD.  I, Candee Furbish, MD, have reviewed all documentation for this visit. The documentation on 07/22/21  for the exam, diagnosis, procedures, and orders are all accurate and complete.   Signed, Candee Furbish, MD  07/22/2021 9:49 AM    [facility]

## 2021-07-22 NOTE — Assessment & Plan Note (Signed)
Atorvastatin 40 mg once a day.  LDL 70 at last check.  No myalgias.

## 2021-07-31 ENCOUNTER — Other Ambulatory Visit: Payer: Self-pay | Admitting: Primary Care

## 2021-07-31 DIAGNOSIS — E119 Type 2 diabetes mellitus without complications: Secondary | ICD-10-CM

## 2021-07-31 NOTE — Telephone Encounter (Signed)
Patient cancelled his follow up visit with me for November. He MUST be seen ASAP for further refills. He is overdue for his diabetes follow up.  Let me know when he's scheduled.

## 2021-08-01 ENCOUNTER — Other Ambulatory Visit: Payer: Self-pay | Admitting: Primary Care

## 2021-08-01 DIAGNOSIS — E119 Type 2 diabetes mellitus without complications: Secondary | ICD-10-CM

## 2021-08-14 ENCOUNTER — Other Ambulatory Visit: Payer: Self-pay

## 2021-08-14 ENCOUNTER — Telehealth: Payer: Self-pay | Admitting: Primary Care

## 2021-08-14 ENCOUNTER — Telehealth: Payer: Medicare Other | Admitting: Primary Care

## 2021-08-14 ENCOUNTER — Ambulatory Visit (INDEPENDENT_AMBULATORY_CARE_PROVIDER_SITE_OTHER): Payer: Medicare Other | Admitting: Primary Care

## 2021-08-14 ENCOUNTER — Encounter: Payer: Self-pay | Admitting: Primary Care

## 2021-08-14 VITALS — BP 126/62 | HR 83 | Temp 98.0°F | Resp 18 | Ht 71.0 in | Wt 210.0 lb

## 2021-08-14 DIAGNOSIS — Z794 Long term (current) use of insulin: Secondary | ICD-10-CM

## 2021-08-14 DIAGNOSIS — E119 Type 2 diabetes mellitus without complications: Secondary | ICD-10-CM

## 2021-08-14 DIAGNOSIS — E1165 Type 2 diabetes mellitus with hyperglycemia: Secondary | ICD-10-CM | POA: Diagnosis not present

## 2021-08-14 LAB — POCT GLYCOSYLATED HEMOGLOBIN (HGB A1C): Hemoglobin A1C: 9 % — AB (ref 4.0–5.6)

## 2021-08-14 MED ORDER — FREESTYLE LIBRE 3 SENSOR MISC: 1.0000 | 1 refills | Status: DC

## 2021-08-14 MED ORDER — TRULICITY 0.75 MG/0.5ML ~~LOC~~ SOAJ: 0.7500 mg | SUBCUTANEOUS | 1 refills | Status: DC

## 2021-08-14 MED ORDER — GLIPIZIDE 10 MG PO TABS: ORAL_TABLET | ORAL | 1 refills | Status: DC

## 2021-08-14 NOTE — Telephone Encounter (Signed)
Christian Petty,   Can you work with this patient for coverage of Trulicity 8.06 mg weekly? It was too expensive with insurance.   Thanks! Allie Bossier, NP-C

## 2021-08-14 NOTE — Telephone Encounter (Signed)
Noted, both prescriptions were resent to [facility] [place] on [address].

## 2021-08-14 NOTE — Addendum Note (Signed)
Addended by: Pleas Koch on: 08/14/2021 04:09 PM   Modules accepted: Orders

## 2021-08-14 NOTE — Telephone Encounter (Signed)
Mrs. Christian Petty called in and stated that Mr. Christian Petty was seen earlier and the [facility] on [address] does not have the medications and wanted to know if they can [facility] [address]. And its for Dulaglutide (TRULICITY) 5.62 ZH/0.8MV SOPN and Continuous Blood Gluc Sensor (FREESTYLE LIBRE 3 SENSOR) MISC

## 2021-08-14 NOTE — Progress Notes (Signed)
Subjective:    Patient ID: Christian Petty, male    DOB: 1955-11-14, 66 y.o.   MRN: [id number]  HPI  Christian Petty is a very pleasant 66 y.o. male with a history of hypertension, type 2 diabetes, BPH, hyperlipidemia who presents today for follow up of diabetes.  Current medications include: metformin 1000 mg BID, Glipizide 10 mg BID, Lantus 45 units HS, Trulicity 3.41 mg weekly.  He cannot afford Trulicity 9.37 mg weekly.   He forgets to take all of his medications once to twice weekly, especially his insulin. He will often forget his evening doses of Glipizide and Metformin.   He is checking his blood glucose infrequently, last check was one week ago soon after breakfast which was 183.   Last A1C: 7.4 in May 2022, 9.0  Last Eye Exam: Due Last Foot Exam: UTD Pneumonia Vaccination: 2016 Urine Microalbumin: None. Managed on ARB Statin: atorvastatin   Dietary changes since last visit: None. He is eating cakes and cookies once every 1-3 days. Eats take out and fast food often.    Exercise: None. He does walk some.    BP Readings from Last 3 Encounters:  08/14/21 126/62  07/22/21 113/69  05/11/21 136/60      Review of Systems  Eyes:  Negative for visual disturbance.  Respiratory:  Negative for shortness of breath.   Cardiovascular:  Negative for chest pain.  Neurological:  Negative for dizziness and numbness.        Past Medical History:  Diagnosis Date   Allergy    Arthritis    BPH (benign prostatic hyperplasia)    Chronic back pain    HTN (hypertension)    states under control with med., has been on med. x 2-3 yr.   Hyperlipidemia, mild    Insulin dependent diabetes mellitus    Trigger thumb of left hand 10/2017    Social History   Socioeconomic History   Marital status: Married    Spouse name: Not on file   Number of children: 2   Years of education: Not on file   Highest education level: Not on file  Occupational History   Occupation: truck driver   Tobacco  Use   Smoking status: Former    Packs/day: 0.00    Types: Cigarettes    Quit date: 11/16/2010    Years since quitting: 10.7   Smokeless tobacco: Never  Vaping Use   Vaping Use: Never used  Substance and Sexual Activity   Alcohol use: Yes    Comment: occasionally   Drug use: No   Sexual activity: Not on file  Other Topics Concern   Not on file  Social History Narrative   Married.   2 children, 2 grandchildren.   Works for himself as a Administrator.   Social Determinants of Health   Financial Resource Strain: Not on file  Food Insecurity: Not on file  Transportation Needs: Not on file  Physical Activity: Not on file  Stress: Not on file  Social Connections: Not on file  Intimate Partner Violence: Not on file    Past Surgical History:  Procedure Laterality Date   COLONOSCOPY  06/19/2006   COLONOSCOPY WITH PROPOFOL  09/28/2016   INGUINAL HERNIA REPAIR     LUMBAR LAMINECTOMY     x 2   POLYPECTOMY     TRIGGER FINGER RELEASE Right 10/04/2017   Procedure: RELEASE TRIGGER THUMB, RIGHT;  Surgeon: Marchia Bond, MD;  Location: [facility];  Service: Orthopedics;  Laterality: Right;    Family History  Problem Relation Age of Onset   Diabetes Father    Breast cancer Mother    Stroke Other 96       F   Colon cancer Neg Hx    Colon polyps Neg Hx    Esophageal cancer Neg Hx    Rectal cancer Neg Hx    Stomach cancer Neg Hx     Not on File  Current Outpatient Medications on File Prior to Visit  Medication Sig Dispense Refill   atorvastatin (LIPITOR) 40 MG tablet TAKE 1 TABLET BY MOUTH ONCE DAILY FOR CHOLESTEROL 90 tablet 3   CONTOUR NEXT TEST test strip Use as instructed 100 strip 2   Dulaglutide (TRULICITY) 5.00 XF/8.1WE SOPN Inject 0.75 mg into the skin once a week. For diabetes. 6 mL 1   fenofibrate 160 MG tablet TAKE 1 TABLET(160 MG) BY MOUTH DAILY FOR CHOLESTEROL 90 tablet 3   glipiZIDE (GLUCOTROL) 10 MG tablet TAKE 1 TABLET BY MOUTH TWICE DAILY BEFORE  A MEAL FOR DIABETES. Office visit required for further refills. 60 tablet 0   insulin glargine (LANTUS SOLOSTAR) 100 UNIT/ML Solostar Pen ADMINISTER 45 UNITS UNDER THE SKIN AT BEDTIME for diabetes. 30 mL 3   Insulin Pen Needle (CAREFINE PEN NEEDLES) 31G X 8 MM MISC Use with insulin once nightly at bedtime. 100 each 3   Lancets MISC Use as instructed to test blood sugar daily 100 each 0   losartan (COZAAR) 100 MG tablet TAKE 1 TABLET BY MOUTH EVERY DAY FOR BLOOD PRESSURE 90 tablet 3   metFORMIN (GLUCOPHAGE) 1000 MG tablet TAKE 1 TABLET BY MOUTH TWICE DAILY WITH MEAL FOR DIABETES 180 tablet 3   metoprolol succinate (TOPROL-XL) 50 MG 24 hr tablet Take 1 tablet (50 mg total) by mouth daily. Take with or immediately following a meal. 90 tablet 3   tamsulosin (FLOMAX) 0.4 MG CAPS capsule TAKE 1 CAPSULE BY MOUTH DAILY AFTER BREAKFAST, FOR URINE FLOW 90 capsule 3   No current facility-administered medications on file prior to visit.    BP 126/62    Pulse 83    Temp 98 F (36.7 C) (Temporal)    Resp 18    Ht 5\' 11"  (1.803 m)    Wt 210 lb (95.3 kg)    SpO2 96%    BMI 29.29 kg/m  Objective:   Physical Exam Cardiovascular:     Rate and Rhythm: Normal rate and regular rhythm.  Pulmonary:     Effort: Pulmonary effort is normal.     Breath sounds: Normal breath sounds. No wheezing or rales.  Musculoskeletal:     Cervical back: Neck supple.  Skin:    General: Skin is warm and dry.  Neurological:     Mental Status: He is alert and oriented to person, place, and time.          Assessment & Plan:   >22 minutes spent face to face with patient, >50% spent counseling or coordinating care.    This visit occurred during the SARS-CoV-2 public health emergency.  Safety protocols were in place, including screening questions prior to the visit, additional usage of staff PPE, and extensive cleaning of exam room while observing appropriate contact time as indicated for disinfecting solutions.

## 2021-08-14 NOTE — Assessment & Plan Note (Signed)
Uncontrolled today with A1C of 9.0.   Long discussion today regarding the absolute need to start checking blood sugars and the dangers that can occur without checking glucose. We also discussed FreeStyle Libre and he is interested. Rx sent to pharmacy.  He cannot afford Trulicity, will have pharmacy work on patient assistance program for 0.75 mg weekly dose.  Also discussed the need for better compliance to ALL medications, he verbalized understanding. I offered to change his metformin and glipizide to ER/XL versions, he declines.   Continue metformin 1000 mg BID, glipizide 10 mg BID, Lantus 45 units daily. Will try to get Trulicity 8.25 mg weekly covered.   He will send [place] glucose readings in a few weeks. We will see him back in 3 months.

## 2021-08-14 NOTE — Patient Instructions (Signed)
You must take all of your medications everyday!  Pick up the FreeStyle Libre glucose monitoring system so that you can continuously monitor your blood sugars. Call me blood sugar readings in 2 weeks.   I will have the pharmacist call you regarding coverage for Trulicity for diabetes.  Stop eating junk food!  It was a pleasure to see you today!  Diabetes Mellitus and Nutrition, Adult When you have diabetes, or diabetes mellitus, it is very important to have healthy eating habits because your blood sugar (glucose) levels are greatly affected by what you eat and drink. Eating healthy foods in the right amounts, at about the same times every day, can help you: Manage your blood glucose. Lower your risk of heart disease. Improve your blood pressure. Reach or maintain a healthy weight. What can affect my meal plan? Every person with diabetes is different, and each person has different needs for a meal plan. Your health care provider may recommend that you work with a dietitian to make a meal plan that is best for you. Your meal plan may vary depending on factors such as: The calories you need. The medicines you take. Your weight. Your blood glucose, blood pressure, and cholesterol levels. Your activity level. Other health conditions you have, such as heart or kidney disease. How do carbohydrates affect me? Carbohydrates, also called carbs, affect your blood glucose level more than any other type of food. Eating carbs raises the amount of glucose in your blood. It is important to know how many carbs you can safely have in each meal. This is different for every person. Your dietitian can help you calculate how many carbs you should have at each meal and for each snack. How does alcohol affect me? Alcohol can cause a decrease in blood glucose (hypoglycemia), especially if you use insulin or take certain diabetes medicines by mouth. Hypoglycemia can be a life-threatening condition. Symptoms of  hypoglycemia, such as sleepiness, dizziness, and confusion, are similar to symptoms of having too much alcohol. Do not drink alcohol if: Your health care provider tells you not to drink. You are pregnant, may be pregnant, or are planning to become pregnant. If you drink alcohol: Limit how much you have to: 0-1 drink a day for women. 0-2 drinks a day for men. Know how much alcohol is in your drink. In the [place]., one drink equals one 12 oz bottle of beer (355 mL), one 5 oz glass of wine (148 mL), or one 1 oz glass of hard liquor (44 mL). Keep yourself hydrated with water, diet soda, or unsweetened iced tea. Keep in mind that regular soda, juice, and other mixers may contain a lot of sugar and must be counted as carbs. What are tips for following this plan? Reading food labels Start by checking the serving size on the Nutrition Facts label of packaged foods and drinks. The number of calories and the amount of carbs, fats, and other nutrients listed on the label are based on one serving of the item. Many items contain more than one serving per package. Check the total grams (g) of carbs in one serving. Check the number of grams of saturated fats and trans fats in one serving. Choose foods that have a low amount or none of these fats. Check the number of milligrams (mg) of salt (sodium) in one serving. Most people should limit total sodium intake to less than 2,300 mg per day. Always check the nutrition information of foods labeled as "low-fat" or "nonfat."  These foods may be higher in added sugar or refined carbs and should be avoided. Talk to your dietitian to identify your daily goals for nutrients listed on the label. Shopping Avoid buying canned, pre-made, or processed foods. These foods tend to be high in fat, sodium, and added sugar. Shop around the outside edge of the grocery store. This is where you will most often find fresh fruits and vegetables, bulk grains, fresh meats, and fresh dairy  products. Cooking Use low-heat cooking methods, such as baking, instead of high-heat cooking methods, such as deep frying. Cook using healthy oils, such as olive, canola, or sunflower oil. Avoid cooking with butter, cream, or high-fat meats. Meal planning Eat meals and snacks regularly, preferably at the same times every day. Avoid going long periods of time without eating. Eat foods that are high in fiber, such as fresh fruits, vegetables, beans, and whole grains. Eat 4-6 oz (112-168 g) of lean protein each day, such as lean meat, chicken, fish, eggs, or tofu. One ounce (oz) (28 g) of lean protein is equal to: 1 oz (28 g) of meat, chicken, or fish. 1 egg.  cup (62 g) of tofu. Eat some foods each day that contain healthy fats, such as avocado, nuts, seeds, and fish. What foods should I eat? Fruits Berries. Apples. Oranges. Peaches. Apricots. Plums. Grapes. Mangoes. Papayas. Pomegranates. Kiwi. Cherries. Vegetables Leafy greens, including lettuce, spinach, kale, chard, collard greens, mustard greens, and cabbage. Beets. Cauliflower. Broccoli. Carrots. Green beans. Tomatoes. Peppers. Onions. Cucumbers. Brussels sprouts. Grains Whole grains, such as whole-wheat or whole-grain bread, crackers, tortillas, cereal, and pasta. Unsweetened oatmeal. Quinoa. Brown or wild rice. Meats and other proteins Seafood. Poultry without skin. Lean cuts of poultry and beef. Tofu. Nuts. Seeds. Dairy Low-fat or fat-free dairy products such as milk, yogurt, and cheese. The items listed above may not be a complete list of foods and beverages you can eat and drink. Contact a dietitian for more information. What foods should I avoid? Fruits Fruits canned with syrup. Vegetables Canned vegetables. Frozen vegetables with butter or cream sauce. Grains Refined white flour and flour products such as bread, pasta, snack foods, and cereals. Avoid all processed foods. Meats and other proteins Fatty cuts of meat.  Poultry with skin. Breaded or fried meats. Processed meat. Avoid saturated fats. Dairy Full-fat yogurt, cheese, or milk. Beverages Sweetened drinks, such as soda or iced tea. The items listed above may not be a complete list of foods and beverages you should avoid. Contact a dietitian for more information. Questions to ask a health care provider Do I need to meet with a certified diabetes care and education specialist? Do I need to meet with a dietitian? What number can I call if I have questions? When are the best times to check my blood glucose? Where to find more information: American Diabetes Association: diabetes.org Academy of Nutrition and Dietetics: eatright.[employer] of Diabetes and Digestive and Kidney Diseases: Amen[employer].is Association of Diabetes Care & Education Specialists: diabeteseducator.org Summary It is important to have healthy eating habits because your blood sugar (glucose) levels are greatly affected by what you eat and drink. It is important to use alcohol carefully. A healthy meal plan will help you manage your blood glucose and lower your risk of heart disease. Your health care provider may recommend that you work with a dietitian to make a meal plan that is best for you. This information is not intended to replace advice given to you by your health care  provider. Make sure you discuss any questions you have with your health care provider. Document Revised: 02/06/2020 Document Reviewed: 02/06/2020 Elsevier Patient Education  [place].

## 2021-08-17 NOTE — Telephone Encounter (Signed)
It looks like Sharyn Lull our pharmacist is working on [employer] covered. Will await her update.  Yes, send in freestyle [place] as DME.

## 2021-08-17 NOTE — Telephone Encounter (Signed)
Called patient to make sure that he had been able to pick up meds. States that they were both over $400. He is not able to get either one. Should the feestyle been sent as DME?

## 2021-08-17 NOTE — Telephone Encounter (Signed)
[place], please review Trulicity assistance program requirements with patient and see if he would like to proceed with application.  To qualify, you must meet the requirements listed below:  You have no insurance or you have Medicare Part D.  You are not enrolled in [place], full [place] (LIS, Extra Help) or Veterans ([place]) Benefits  Your annual household income is less than the Annual Adjusted Gross Income Limit listed below: Income limit for family of 2 is less than $73,240 annually.   Debbora Dus, PharmD Clinical Pharmacist Practitioner [facility] [phone]

## 2021-08-18 ENCOUNTER — Other Ambulatory Visit: Payer: Self-pay | Admitting: Primary Care

## 2021-08-18 DIAGNOSIS — Z794 Long term (current) use of insulin: Secondary | ICD-10-CM

## 2021-08-18 DIAGNOSIS — E1165 Type 2 diabetes mellitus with hyperglycemia: Secondary | ICD-10-CM

## 2021-08-18 MED ORDER — FREESTYLE LIBRE 3 SENSOR MISC: 1.0000 | 1 refills | Status: DC

## 2021-08-18 NOTE — Telephone Encounter (Signed)
Left message for the patient to return phone call if interested in applying for assistance with Trulicity 0.75mg  weekly injection.  Debbora Dus, CPP notified  Avel Sensor, [place] Assistant [phone]

## 2021-08-21 NOTE — Telephone Encounter (Signed)
Attempted to reach out to the patient to see if there is interest in applying for Trulicity 0.75mg  weekly injection. Patient did not answer the telephone. Message left with contact phone number.  Debbora Dus, CPP notified  Avel Sensor, [place] Assistant [phone]

## 2021-08-24 NOTE — Progress Notes (Signed)
Called patient to inform him about Trulicity patient assistance. I went over the requirements with the patient and he would like to proceed. Application for Trulicity has been completed and uploaded. Patient will come to the [facility] office on 02/13 to complete his portion. I advised patient to bring proof of income with him to the office.   Debbora Dus, CPP notified  Marijean Niemann, [place] Clinical Pharmacy Assistant [phone]  Time Spent: 30 Minutes

## 2021-08-24 NOTE — Telephone Encounter (Signed)
Reviewed Trulicity forms and sent to [place] to print and coordinate signatures.

## 2021-08-25 NOTE — Telephone Encounter (Signed)
Order for freestyle faxed to [facility]

## 2021-08-31 NOTE — Telephone Encounter (Signed)
Forms printed and placed in front office "patient documents" file for patient to come sign and leave proof of income. Please return to me once complete.

## 2021-09-14 NOTE — Telephone Encounter (Signed)
Spoke with the patient and informed him to go by with financial information to [facility] for signing of PAP forms. Office can copy financials and fax forms once information is obtained.   [place] CPP notified  Avel Sensor, [place] Assistant [phone]  Total time spent for month CPA: 10 min.

## 2021-09-15 ENCOUNTER — Telehealth: Payer: Self-pay | Admitting: Primary Care

## 2021-09-15 NOTE — Telephone Encounter (Signed)
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

## 2021-09-18 NOTE — Telephone Encounter (Signed)
Tried to call patient to get more information regarding the letter needed. Unable to leave a message because mail box is full.  ?

## 2021-09-21 DIAGNOSIS — H5203 Hypermetropia, bilateral: Secondary | ICD-10-CM | POA: Diagnosis not present

## 2021-09-21 DIAGNOSIS — H04212 Epiphora due to excess lacrimation, left lacrimal gland: Secondary | ICD-10-CM | POA: Diagnosis not present

## 2021-09-21 DIAGNOSIS — E119 Type 2 diabetes mellitus without complications: Secondary | ICD-10-CM | POA: Diagnosis not present

## 2021-09-21 DIAGNOSIS — H02831 Dermatochalasis of right upper eyelid: Secondary | ICD-10-CM | POA: Diagnosis not present

## 2021-09-21 LAB — HM DIABETES EYE EXAM

## 2021-09-22 NOTE — Telephone Encounter (Signed)
Pt came to the office stating that he talked to someone yesterday about needing the same letter that Christian Petty did for him last year. Please advise. ?

## 2021-09-22 NOTE — Telephone Encounter (Addendum)
Patient has dropped off copy of letter that we have given him. I have called patient he did not have copy of form but I was able to find online and print. Placed in your folder. Patient would like to have done as soon as possible.  ?

## 2021-09-23 NOTE — Telephone Encounter (Signed)
Called patient informed that letter and form are at reception for pick up. Will come by and get.  ?

## 2021-09-23 NOTE — Telephone Encounter (Signed)
Noted. ?Printed and placed in [place]. ?

## 2021-09-23 NOTE — Telephone Encounter (Signed)
Completed and placed in Joellen's inbox. 

## 2021-09-23 NOTE — Telephone Encounter (Signed)
He needs letter as well. Like one we provided last year.  ?

## 2021-10-05 ENCOUNTER — Encounter: Payer: Self-pay | Admitting: Primary Care

## 2021-10-11 ENCOUNTER — Other Ambulatory Visit: Payer: Self-pay | Admitting: Primary Care

## 2021-10-11 DIAGNOSIS — I10 Essential (primary) hypertension: Secondary | ICD-10-CM

## 2021-10-11 DIAGNOSIS — N400 Enlarged prostate without lower urinary tract symptoms: Secondary | ICD-10-CM

## 2021-10-16 ENCOUNTER — Telehealth: Payer: Self-pay

## 2021-10-16 NOTE — Progress Notes (Signed)
? ? ?  Chronic Care Management ?Pharmacy Assistant  ? ?Name: Christian Petty  MRN: [id number] DOB: 1955-08-27 ? ?Reason for Encounter: CCM (Trulicity 2778 PAP) ?  ?Patient was to come by the office to sign PAP forms and bring financial information so Trulicity application with [employer] could be completed. As of today this has not been completed. I called patient to follow up. Patient stated he has been tied up doing other things. I advised the patient that the forms will be at the office when he is able to sign them. No other action needed; patient will come to the office when ready.  ? ?Charlene Brooke, CPP notified ? ?Marijean Niemann, RMA ?Clinical Pharmacy Assistant ?[phone] ? ? ?

## 2021-10-25 ENCOUNTER — Other Ambulatory Visit: Payer: Self-pay | Admitting: Primary Care

## 2021-10-25 ENCOUNTER — Other Ambulatory Visit: Payer: Self-pay | Admitting: Family Medicine

## 2021-10-25 DIAGNOSIS — E119 Type 2 diabetes mellitus without complications: Secondary | ICD-10-CM

## 2021-10-25 DIAGNOSIS — E118 Type 2 diabetes mellitus with unspecified complications: Secondary | ICD-10-CM

## 2021-10-25 DIAGNOSIS — E782 Mixed hyperlipidemia: Secondary | ICD-10-CM

## 2021-10-26 NOTE — Telephone Encounter (Signed)
Is this okay to refill? 

## 2021-11-04 ENCOUNTER — Other Ambulatory Visit: Payer: Self-pay | Admitting: Primary Care

## 2021-11-04 DIAGNOSIS — E782 Mixed hyperlipidemia: Secondary | ICD-10-CM

## 2021-11-13 ENCOUNTER — Ambulatory Visit: Payer: Medicare Other | Admitting: Primary Care

## 2021-11-27 ENCOUNTER — Ambulatory Visit: Payer: Medicare Other | Admitting: Primary Care

## 2021-11-30 ENCOUNTER — Telehealth: Payer: Self-pay

## 2021-11-30 ENCOUNTER — Ambulatory Visit: Payer: Medicare Other

## 2021-11-30 NOTE — Telephone Encounter (Signed)
Unsuccessful attempt to reach patient on preferred number listed in notes for scheduled AWV. Left message on voicemail okay to reschedule. 

## 2021-12-10 ENCOUNTER — Telehealth: Payer: Self-pay | Admitting: Primary Care

## 2021-12-10 NOTE — Telephone Encounter (Signed)
Left message for patient to call back and schedule Medicare Annual Wellness Visit (AWV) either virtually or phone   awvi 12/17/20 per [place]   please schedule at anytime with health coach  This should be a 45 minute visit.  I left my direct # [phone]

## 2021-12-15 ENCOUNTER — Ambulatory Visit: Payer: Medicare Other | Admitting: Primary Care

## 2021-12-16 ENCOUNTER — Ambulatory Visit (INDEPENDENT_AMBULATORY_CARE_PROVIDER_SITE_OTHER): Payer: Medicare Other | Admitting: Primary Care

## 2021-12-16 VITALS — BP 134/72 | HR 74 | Temp 98.6°F | Ht 71.0 in | Wt 207.0 lb

## 2021-12-16 DIAGNOSIS — Z794 Long term (current) use of insulin: Secondary | ICD-10-CM | POA: Diagnosis not present

## 2021-12-16 DIAGNOSIS — E782 Mixed hyperlipidemia: Secondary | ICD-10-CM | POA: Diagnosis not present

## 2021-12-16 DIAGNOSIS — E1165 Type 2 diabetes mellitus with hyperglycemia: Secondary | ICD-10-CM | POA: Diagnosis not present

## 2021-12-16 LAB — POCT GLYCOSYLATED HEMOGLOBIN (HGB A1C): Hemoglobin A1C: 7.8 % — AB (ref 4.0–5.6)

## 2021-12-16 LAB — LIPID PANEL
Cholesterol: 140 mg/dL (ref 0–200)
HDL: 26.5 mg/dL — ABNORMAL LOW (ref >39.00)
NonHDL: 113.12
Total CHOL/HDL Ratio: 5
Triglycerides: 236 mg/dL — ABNORMAL HIGH (ref 0.0–149.0)
VLDL: 47.2 mg/dL — ABNORMAL HIGH (ref 0.0–40.0)

## 2021-12-16 LAB — COMPREHENSIVE METABOLIC PANEL
ALT: 16 U/L (ref 0–53)
AST: 14 U/L (ref 0–37)
Albumin: 4.4 g/dL (ref 3.5–5.2)
Alkaline Phosphatase: 27 U/L — ABNORMAL LOW (ref 39–117)
BUN: 25 mg/dL — ABNORMAL HIGH (ref 6–23)
CO2: 27 mEq/L (ref 19–32)
Calcium: 10.5 mg/dL (ref 8.4–10.5)
Chloride: 99 mEq/L (ref 96–112)
Creatinine, Ser: 1.36 mg/dL (ref 0.40–1.50)
GFR: 54.05 mL/min — ABNORMAL LOW (ref >60.00)
Glucose, Bld: 187 mg/dL — ABNORMAL HIGH (ref 70–99)
Potassium: 4.4 mEq/L (ref 3.5–5.1)
Sodium: 135 mEq/L (ref 135–145)
Total Bilirubin: 0.5 mg/dL (ref 0.2–1.2)
Total Protein: 7.3 g/dL (ref 6.0–8.3)

## 2021-12-16 LAB — CBC
HCT: 40.2 % (ref 39.0–52.0)
Hemoglobin: 13.2 g/dL (ref 13.0–17.0)
MCHC: 32.7 g/dL (ref 30.0–36.0)
MCV: 82.9 fl (ref 78.0–100.0)
Platelets: 339 10*3/uL (ref 150.0–400.0)
RBC: 4.86 Mil/uL (ref 4.22–5.81)
RDW: 14.7 % (ref 11.5–15.5)
WBC: 8.3 10*3/uL (ref 4.0–10.5)

## 2021-12-16 LAB — LDL CHOLESTEROL, DIRECT: Direct LDL: 89 mg/dL

## 2021-12-16 NOTE — Patient Instructions (Addendum)
Stop by the lab prior to leaving today. I will notify you of your results once received.   Continue to work on [employer] as discussed.  Please complete the paperwork for your Trulicity medication for diabetes. Let me know once this is done.  Please schedule a follow up visit for 6 months for your physical.  It was a pleasure to see you today!

## 2021-12-16 NOTE — Assessment & Plan Note (Signed)
Improved with A1c of 7.8 today!  Commended him on dietary changes, encouraged to continue. Continue Lantus 45 units daily, metformin at 1000 mg twice daily.  I strongly advised he follow through with the patient assistance paperwork for Trulicity as I believe he would be successful on this medication.  Labs pending today. Follow-up in 6 months or sooner if glucose levels start escalating.

## 2021-12-16 NOTE — Progress Notes (Signed)
Subjective:    Patient ID: Christian Petty, male    DOB: 07/13/1956, 66 y.o.   MRN: [id number]  HPI  Christian Petty is a very pleasant 66 y.o. male with a history of type 2 diabetes, hypertension, BPH, tobacco abuse, hyperlipidemia who presents today for follow-up of diabetes.  Current medications include: Trulicity 8.93 mg weekly, Lantus 45 units at bedtime, metformin 1000 mg twice daily.  He never completed paperwork for Trulicity for patient assistance. He does plan on following through with this soon.   He is checking his blood glucose 3-4 times weekly and is getting readings ranging 130's-150's.   Last A1C: 9.0 in January 2023, 7.8 today Last Eye Exam: UTD Last Foot Exam: Due  Pneumonia Vaccination: 2016 Urine Microalbumin: None. Managed on ARB Statin: atorvastatin   Dietary changes since last visit: He has cut back on portion sizes.    Exercise: None.       Review of Systems  Eyes:  Negative for visual disturbance.  Respiratory:  Negative for shortness of breath.   Cardiovascular:  Negative for chest pain.  Neurological:  Negative for dizziness and numbness.        Past Medical History:  Diagnosis Date   Allergy    Arthritis    BPH (benign prostatic hyperplasia)    Chronic back pain    HTN (hypertension)    states under control with med., has been on med. x 2-3 yr.   Hyperlipidemia, mild    Insulin dependent diabetes mellitus    Trigger thumb of left hand 10/2017    Social History   Socioeconomic History   Marital status: Married    Spouse name: Not on file   Number of children: 2   Years of education: Not on file   Highest education level: Not on file  Occupational History   Occupation: truck driver   Tobacco Use   Smoking status: Former    Packs/day: 0.00    Types: Cigarettes    Quit date: 11/16/2010    Years since quitting: 11.0   Smokeless tobacco: Never  Vaping Use   Vaping Use: Never used  Substance and Sexual Activity   Alcohol use: Yes     Comment: occasionally   Drug use: No   Sexual activity: Not on file  Other Topics Concern   Not on file  Social History Narrative   Married.   2 children, 2 grandchildren.   Works for himself as a Administrator.   Social Determinants of Health   Financial Resource Strain: Not on file  Food Insecurity: Not on file  Transportation Needs: Not on file  Physical Activity: Not on file  Stress: Not on file  Social Connections: Not on file  Intimate Partner Violence: Not on file    Past Surgical History:  Procedure Laterality Date   COLONOSCOPY  06/19/2006   COLONOSCOPY WITH PROPOFOL  09/28/2016   INGUINAL HERNIA REPAIR     LUMBAR LAMINECTOMY     x 2   POLYPECTOMY     TRIGGER FINGER RELEASE Right 10/04/2017   Procedure: RELEASE TRIGGER THUMB, RIGHT;  Surgeon: Marchia Bond, MD;  Location: [facility];  Service: Orthopedics;  Laterality: Right;    Family History  Problem Relation Age of Onset   Diabetes Father    Breast cancer Mother    Stroke Other 77       F   Colon cancer Neg Hx    Colon polyps Neg Hx  Esophageal cancer Neg Hx    Rectal cancer Neg Hx    Stomach cancer Neg Hx     Not on File  Current Outpatient Medications on File Prior to Visit  Medication Sig Dispense Refill   atorvastatin (LIPITOR) 40 MG tablet TAKE 1 TABLET BY MOUTH EVERY DAY FOR CHOLESTEROL 90 tablet 0   Continuous Blood Gluc Sensor (FREESTYLE LIBRE 3 SENSOR) MISC 1 Device by Does not apply route continuous. Place 1 sensor on the skin every 14 days. Use to check glucose continuously 6 each 1   CONTOUR NEXT TEST test strip Use as instructed 100 strip 2   Dulaglutide (TRULICITY) 7.90 WI/0.9BD SOPN Inject 0.75 mg into the skin once a week. For diabetes. 6 mL 1   fenofibrate 160 MG tablet Take 1 tablet (160 mg total) by mouth daily. for cholesterol. 90 tablet 0   glipiZIDE (GLUCOTROL) 10 MG tablet TAKE 1 TABLET BY MOUTH TWICE DAILY BEFORE A MEAL FOR DIABETES. 180 tablet 1   insulin  glargine (LANTUS SOLOSTAR) 100 UNIT/ML Solostar Pen ADMINISTER 45 UNITS UNDER THE SKIN AT BEDTIME for diabetes. 30 mL 3   Insulin Pen Needle (CAREFINE PEN NEEDLES) 31G X 8 MM MISC Use with insulin once nightly at bedtime. 100 each 3   Lancets MISC Use as instructed to test blood sugar daily 100 each 0   losartan (COZAAR) 100 MG tablet TAKE 1 TABLET BY MOUTH EVERY DAY FOR BLOOD PRESSURE 90 tablet 0   metFORMIN (GLUCOPHAGE) 1000 MG tablet TAKE 1 TABLET BY MOUTH TWICE DAILY WITH MEAL FOR DIABETES 180 tablet 0   metoprolol succinate (TOPROL-XL) 50 MG 24 hr tablet Take 1 tablet (50 mg total) by mouth daily. Take with or immediately following a meal. 90 tablet 3   tamsulosin (FLOMAX) 0.4 MG CAPS capsule TAKE 1 CAPSULE BY MOUTH ONCE DAILY AFTER BREAKFAST, FOR URINE FLOW 90 capsule 0   No current facility-administered medications on file prior to visit.    BP 134/72   Pulse 74   Temp 98.6 F (37 C) (Oral)   Ht '5\' 11"'$  (1.803 m)   Wt 207 lb (93.9 kg)   SpO2 98%   BMI 28.87 kg/m  Objective:   Physical Exam Cardiovascular:     Rate and Rhythm: Normal rate and regular rhythm.  Pulmonary:     Effort: Pulmonary effort is normal.     Breath sounds: Normal breath sounds. No wheezing or rales.  Musculoskeletal:     Cervical back: Neck supple.  Skin:    General: Skin is warm and dry.  Neurological:     Mental Status: He is alert and oriented to person, place, and time.          Assessment & Plan:

## 2021-12-22 ENCOUNTER — Telehealth: Payer: Self-pay | Admitting: Pharmacist

## 2021-12-22 DIAGNOSIS — E1165 Type 2 diabetes mellitus with hyperglycemia: Secondary | ICD-10-CM

## 2021-12-22 MED ORDER — OZEMPIC (0.25 OR 0.5 MG/DOSE) 2 MG/3ML ~~LOC~~ SOPN: PEN_INJECTOR | SUBCUTANEOUS | 0 refills | Status: DC

## 2021-12-22 NOTE — Telephone Encounter (Signed)
Patient called to discuss Trulicity assistance. Per chart review, this was prepared for him earlier in the year but he did not complete it yet. He was not enrolled in this program last year and [facility] is not accepting new applicants for Trulicity at this time. Per patient he has not been very consistent with Trulicity and did not pick it up from the pharmacy due to high copay.   We can switch him to Ozempic and apply to [employer], who is accepting applications.

## 2021-12-22 NOTE — Addendum Note (Signed)
Addended by: Pleas Koch on: 12/22/2021 04:49 PM   Modules accepted: Orders

## 2021-12-22 NOTE — Telephone Encounter (Signed)
Yes! New Rx sent to pharmacy for [place].

## 2021-12-23 NOTE — Telephone Encounter (Signed)
Received signed paperwork from patient. We are missing his Medicare number (from red-white-blue original Medicare card).

## 2021-12-23 NOTE — Progress Notes (Signed)
Called patient for his Medicare ID number; no answer; left a message  Charlene Brooke, CPP notified  Marijean Niemann, [place] Clinical Pharmacy Assistant [phone]

## 2021-12-24 NOTE — Progress Notes (Signed)
Contacted patient for is ID number off his Medicare card. No answer; left message.   Charlene Brooke, CPP notified  Marijean Niemann, [place] Clinical Pharmacy Assistant [phone]

## 2021-12-31 ENCOUNTER — Telehealth: Payer: Self-pay

## 2021-12-31 NOTE — Progress Notes (Signed)
Called patient to advise him we need his Red, White and Blue card from [employer] to process his Ozempic patient assistance forms. Patient is going to have his wife send me a copy of the card.   Lillie Fragmin, CPP notified  Marijean Niemann, [place] Clinical Pharmacy Assistant [phone]

## 2022-01-02 ENCOUNTER — Other Ambulatory Visit: Payer: Self-pay | Admitting: Primary Care

## 2022-01-02 DIAGNOSIS — N400 Enlarged prostate without lower urinary tract symptoms: Secondary | ICD-10-CM

## 2022-01-02 DIAGNOSIS — I10 Essential (primary) hypertension: Secondary | ICD-10-CM

## 2022-01-25 ENCOUNTER — Other Ambulatory Visit: Payer: Self-pay | Admitting: Primary Care

## 2022-01-25 DIAGNOSIS — Z794 Long term (current) use of insulin: Secondary | ICD-10-CM

## 2022-01-25 DIAGNOSIS — E119 Type 2 diabetes mellitus without complications: Secondary | ICD-10-CM

## 2022-01-25 DIAGNOSIS — E782 Mixed hyperlipidemia: Secondary | ICD-10-CM

## 2022-01-25 DIAGNOSIS — E1165 Type 2 diabetes mellitus with hyperglycemia: Secondary | ICD-10-CM

## 2022-02-12 ENCOUNTER — Other Ambulatory Visit: Payer: Self-pay | Admitting: Primary Care

## 2022-02-12 DIAGNOSIS — E782 Mixed hyperlipidemia: Secondary | ICD-10-CM

## 2022-02-21 ENCOUNTER — Other Ambulatory Visit: Payer: Self-pay | Admitting: Primary Care

## 2022-02-21 DIAGNOSIS — E119 Type 2 diabetes mellitus without complications: Secondary | ICD-10-CM

## 2022-03-23 ENCOUNTER — Ambulatory Visit: Payer: Medicare Other | Admitting: Family Medicine

## 2022-03-24 ENCOUNTER — Encounter: Payer: Self-pay | Admitting: Family Medicine

## 2022-03-24 ENCOUNTER — Ambulatory Visit (INDEPENDENT_AMBULATORY_CARE_PROVIDER_SITE_OTHER): Payer: Medicare Other | Admitting: Family Medicine

## 2022-03-24 VITALS — BP 90/58 | HR 85 | Wt 204.4 lb

## 2022-03-24 DIAGNOSIS — R051 Acute cough: Secondary | ICD-10-CM | POA: Diagnosis not present

## 2022-03-24 LAB — POC COVID19 BINAXNOW: SARS Coronavirus 2 Ag: NEGATIVE

## 2022-03-24 MED ORDER — GUAIFENESIN-CODEINE 100-10 MG/5ML PO SYRP: 5.0000 mL | ORAL_SOLUTION | Freq: Every evening | ORAL | 0 refills | Status: DC | PRN

## 2022-03-24 MED ORDER — BENZONATATE 200 MG PO CAPS: 200.0000 mg | ORAL_CAPSULE | Freq: Two times a day (BID) | ORAL | 0 refills | Status: DC | PRN

## 2022-03-24 MED ORDER — AZITHROMYCIN 250 MG PO TABS: ORAL_TABLET | ORAL | 0 refills | Status: DC

## 2022-03-24 NOTE — Progress Notes (Signed)
Patient ID: Christian Petty, male    DOB: 12-Jan-1956, 66 y.o.   MRN: [id number]  This visit was conducted in person.  BP (!) 90/58   Pulse 85   Wt 204 lb 6.4 oz (92.7 kg)   SpO2 95%   BMI 28.51 kg/m    CC:  Chief Complaint  Patient presents with   Nasal Congestion    Chest cold, congestion, cough    Subjective:   HPI: Christian Petty is a 66 y.o. male patient of Allie Bossier, NP former smoker presenting on 03/24/2022 for Nasal Congestion (Chest cold, congestion, cough)  Date of onset:  8 days ago  Initial symptoms cough, productive... cough is keeping him up at night. Productive green mucus cough, rattling in chest.  Symptoms progressed to chest soreness from coughing.  Nasal congestion, bit no ear pain, no ST, no sinus pain. No fever, no body aches.  NO SOB, no wheeze. He denies chronic lung disease such as COPD, emphysema or asthma.  No known COVID exposure No COVID testing.      Relevant past medical, surgical, family and social history reviewed and updated as indicated. Interim medical history since our last visit reviewed. Allergies and medications reviewed and updated. Outpatient Medications Prior to Visit  Medication Sig Dispense Refill   atorvastatin (LIPITOR) 40 MG tablet TAKE 1 TABLET BY MOUTH EVERY DAY FOR CHOLESTEROL 90 tablet 2   Continuous Blood Gluc Sensor (FREESTYLE LIBRE 3 SENSOR) MISC 1 Device by Does not apply route continuous. Place 1 sensor on the skin every 14 days. Use to check glucose continuously 6 each 1   CONTOUR NEXT TEST test strip Use as instructed 100 strip 2   fenofibrate 160 MG tablet TAKE 1 TABLET(160 MG) BY MOUTH DAILY FOR CHOLESTEROL 90 tablet 1   glipiZIDE (GLUCOTROL) 10 MG tablet TAKE 1 TABLET BY MOUTH TWICE DAILY BEFORE A MEAL FOR DIABETES 180 tablet 1   insulin glargine (LANTUS SOLOSTAR) 100 UNIT/ML Solostar Pen ADMINISTER 45 UNITS UNDER THE SKIN AT BEDTIME FOR DIABETES 45 mL 1   Insulin Pen Needle (CAREFINE PEN NEEDLES) 31G X 8 MM MISC Use  with insulin once nightly at bedtime. 100 each 3   Lancets MISC Use as instructed to test blood sugar daily 100 each 0   losartan (COZAAR) 100 MG tablet TAKE 1 TABLET BY MOUTH EVERY DAY FOR BLOOD PRESSURE 90 tablet 1   metFORMIN (GLUCOPHAGE) 1000 MG tablet TAKE 1 TABLET BY MOUTH TWICE DAILY WITH MEAL FOR DIABETES 180 tablet 1   metoprolol succinate (TOPROL-XL) 50 MG 24 hr tablet Take 1 tablet (50 mg total) by mouth daily. Take with or immediately following a meal. 90 tablet 3   Semaglutide,0.25 or 0.'5MG'$ /DOS, (OZEMPIC, 0.25 OR 0.5 MG/DOSE,) 2 MG/3ML SOPN Inject 0.25 mg into the skin once weekly for 4 weeks, then increase to 0.5 mg once weekly thereafter for diabetes. 9 mL 0   tamsulosin (FLOMAX) 0.4 MG CAPS capsule TAKE 1 CAPSULE BY MOUTH ONCE DAILY AFTER BREAKFAST FOR URINE FLOW 90 capsule 1   No facility-administered medications prior to visit.     Per HPI unless specifically indicated in ROS section below Review of Systems  Constitutional:  Negative for fatigue and fever.  HENT:  Positive for congestion. Negative for ear pain.   Eyes:  Negative for pain.  Respiratory:  Positive for cough. Negative for shortness of breath.   Cardiovascular:  Positive for chest pain. Negative for palpitations and leg swelling.  Gastrointestinal:  Negative  for abdominal pain.  Genitourinary:  Negative for dysuria.  Musculoskeletal:  Negative for arthralgias.  Neurological:  Negative for syncope, light-headedness and headaches.  Psychiatric/Behavioral:  Negative for dysphoric mood.    Objective:  BP (!) 90/58   Pulse 85   Wt 204 lb 6.4 oz (92.7 kg)   SpO2 95%   BMI 28.51 kg/m   Wt Readings from Last 3 Encounters:  03/24/22 204 lb 6.4 oz (92.7 kg)  12/16/21 207 lb (93.9 kg)  08/14/21 210 lb (95.3 kg)      Physical Exam Constitutional:      Appearance: He is well-developed.  HENT:     Head: Normocephalic.     Right Ear: Hearing normal.     Left Ear: Hearing normal.     Nose: Nose normal.   Neck:     Thyroid: No thyroid mass or thyromegaly.     Vascular: No carotid bruit.     Trachea: Trachea normal.  Cardiovascular:     Rate and Rhythm: Normal rate and regular rhythm.     Pulses: Normal pulses.     Heart sounds: Heart sounds not distant. No murmur heard.    No friction rub. No gallop.     Comments: No peripheral edema Pulmonary:     Effort: Pulmonary effort is normal. No respiratory distress.     Breath sounds: Normal breath sounds. No decreased breath sounds, wheezing or rhonchi.     Comments: Constant hacking cough Skin:    General: Skin is warm and dry.     Findings: No rash.  Psychiatric:        Speech: Speech normal.        Behavior: Behavior normal.        Thought Content: Thought content normal.       Results for orders placed or performed in visit on 12/16/21  Lipid panel  Result Value Ref Range   Cholesterol 140 0 - 200 mg/dL   Triglycerides 236.0 (H) 0.0 - 149.0 mg/dL   HDL 26.50 (L) >39.00 mg/dL   VLDL 47.2 (H) 0.0 - 40.0 mg/dL   Total CHOL/HDL Ratio 5    NonHDL 113.12   Comprehensive metabolic panel  Result Value Ref Range   Sodium 135 135 - 145 mEq/L   Potassium 4.4 3.5 - 5.1 mEq/L   Chloride 99 96 - 112 mEq/L   CO2 27 19 - 32 mEq/L   Glucose, Bld 187 (H) 70 - 99 mg/dL   BUN 25 (H) 6 - 23 mg/dL   Creatinine, Ser 1.36 0.40 - 1.50 mg/dL   Total Bilirubin 0.5 0.2 - 1.2 mg/dL   Alkaline Phosphatase 27 (L) 39 - 117 U/L   AST 14 0 - 37 U/L   ALT 16 0 - 53 U/L   Total Protein 7.3 6.0 - 8.3 g/dL   Albumin 4.4 3.5 - 5.2 g/dL   GFR 54.05 (L) >60.00 mL/min   Calcium 10.5 8.4 - 10.5 mg/dL  CBC  Result Value Ref Range   WBC 8.3 4.0 - 10.5 K/uL   RBC 4.86 4.22 - 5.81 Mil/uL   Platelets 339.0 150.0 - 400.0 K/uL   Hemoglobin 13.2 13.0 - 17.0 g/dL   HCT 40.2 39.0 - 52.0 %   MCV 82.9 78.0 - 100.0 fl   MCHC 32.7 30.0 - 36.0 g/dL   RDW 14.7 11.5 - 15.5 %  LDL cholesterol, direct  Result Value Ref Range   Direct LDL 89.0 mg/dL  POCT glycosylated  hemoglobin (Hb  A1C)  Result Value Ref Range   Hemoglobin A1C 7.8 (A) 4.0 - 5.6 %   HbA1c POC (<> result, manual entry)     HbA1c, POC (prediabetic range)     HbA1c, POC (controlled diabetic range)       COVID 19 screen:  No recent travel or known exposure to COVID19 The patient denies respiratory symptoms of COVID 19 at this time. The importance of social distancing was discussed today.   Assessment and Plan    Problem List Items Addressed This Visit     Acute cough - Primary    Acute Negative COVID testing today in office. Likely initially viral upper respiratory tract infection now with bacterial superinfection.  Given smoking history and other chronic issues, I will treat him for bacterial infection with azithromycin.  Prescribed benzonatate for cough during the day and guaifenesin with codeine at night.  Encouraged increase in fluids and rest.  ER and return precautions provided.      Relevant Orders   POC COVID-19 BinaxNow   Meds ordered this encounter  Medications   guaiFENesin-codeine (ROBITUSSIN AC) 100-10 MG/5ML syrup    Sig: Take 5-10 mLs by mouth at bedtime as needed for cough.    Dispense:  180 mL    Refill:  0   benzonatate (TESSALON) 200 MG capsule    Sig: Take 1 capsule (200 mg total) by mouth 2 (two) times daily as needed for cough.    Dispense:  20 capsule    Refill:  0   azithromycin (ZITHROMAX) 250 MG tablet    Sig: 2 tab po x 1 day then 1 tab po daily    Dispense:  6 tablet    Refill:  0   Orders Placed This Encounter  Procedures   POC COVID-19 BinaxNow    Order Specific Question:   Previously tested for COVID-19    Answer:   No    Order Specific Question:   Resident in a congregate (group) care setting    Answer:   No    Order Specific Question:   Employed in healthcare setting    Answer:   No     Eliezer Lofts, MD

## 2022-03-24 NOTE — Assessment & Plan Note (Signed)
Acute Negative COVID testing today in office. Likely initially viral upper respiratory tract infection now with bacterial superinfection.  Given smoking history and other chronic issues, I will treat him for bacterial infection with azithromycin.  Prescribed benzonatate for cough during the day and guaifenesin with codeine at night.  Encouraged increase in fluids and rest.  ER and return precautions provided.

## 2022-03-24 NOTE — Patient Instructions (Signed)
Can use benzonatate for cough during the day, codeine cough suppressant at night. Start and complete antibiotics.  Call if not improving as expected or  not tolerating antibiotics.  GO to ER if severe shortness of breath

## 2022-04-27 ENCOUNTER — Other Ambulatory Visit (HOSPITAL_BASED_OUTPATIENT_CLINIC_OR_DEPARTMENT_OTHER): Payer: Self-pay | Admitting: Cardiology

## 2022-05-14 ENCOUNTER — Telehealth: Payer: Self-pay

## 2022-05-14 ENCOUNTER — Ambulatory Visit: Payer: Medicare Other

## 2022-05-14 NOTE — Telephone Encounter (Signed)
Unsuccessful attempt to reach patient on preferred number listed in notes for scheduled AWV. Left message on voicemail okay to reschedule. 

## 2022-05-18 ENCOUNTER — Encounter: Payer: Self-pay | Admitting: Physician Assistant

## 2022-05-28 ENCOUNTER — Ambulatory Visit: Payer: Medicare Other | Admitting: Family Medicine

## 2022-06-01 ENCOUNTER — Ambulatory Visit (INDEPENDENT_AMBULATORY_CARE_PROVIDER_SITE_OTHER): Payer: Medicare Other | Admitting: Family Medicine

## 2022-06-01 ENCOUNTER — Encounter: Payer: Self-pay | Admitting: Family Medicine

## 2022-06-01 VITALS — BP 110/60 | HR 81 | Temp 98.0°F | Ht 71.0 in | Wt 209.1 lb

## 2022-06-01 DIAGNOSIS — R350 Frequency of micturition: Secondary | ICD-10-CM | POA: Diagnosis not present

## 2022-06-01 DIAGNOSIS — E1165 Type 2 diabetes mellitus with hyperglycemia: Secondary | ICD-10-CM | POA: Diagnosis not present

## 2022-06-01 DIAGNOSIS — Z794 Long term (current) use of insulin: Secondary | ICD-10-CM

## 2022-06-01 LAB — POC URINALSYSI DIPSTICK (AUTOMATED)
Bilirubin, UA: NEGATIVE
Blood, UA: NEGATIVE
Glucose, UA: POSITIVE — AB
Ketones, UA: NEGATIVE
Leukocytes, UA: NEGATIVE
Nitrite, UA: NEGATIVE
Protein, UA: POSITIVE — AB
Spec Grav, UA: 1.025 (ref 1.010–1.025)
Urobilinogen, UA: 0.2 E.U./dL
pH, UA: 5 (ref 5.0–8.0)

## 2022-06-01 LAB — POCT GLYCOSYLATED HEMOGLOBIN (HGB A1C): Hemoglobin A1C: 9.6 % — AB (ref 4.0–5.6)

## 2022-06-01 MED ORDER — MIRABEGRON ER 25 MG PO TB24: 25.0000 mg | ORAL_TABLET | Freq: Every day | ORAL | 11 refills | Status: DC

## 2022-06-01 NOTE — Patient Instructions (Addendum)
Drink water, avoid bladder  irritants. Make sure taking insulin every day. Work on low [employer].  Can try trial of Myrbetriq if not improving with sugar control. Keep appt with PCP as scheduled.

## 2022-06-01 NOTE — Progress Notes (Signed)
Patient ID: Christian Petty, male    DOB: April 14, 1956, 66 y.o.   MRN: [id number]  This visit was conducted in person.  BP 110/60   Pulse 81   Temp 98 F (36.7 C) (Oral)   Ht '5\' 11"'$  (1.803 m)   Wt 209 lb 2 oz (94.9 kg)   SpO2 96%   BMI 29.17 kg/m    CC:  Chief Complaint  Patient presents with   Urinary Frequency    Subjective:   HPI: Christian Petty is a 66 y.o. male presenting on 06/01/2022 for Urinary Frequency   Hx of BPH, DM, on flomax Lab Results  Component Value Date   HGBA1C 7.8 (A) 12/16/2021   Today he reports increase in urinary frequency ongoing x 6 months, worse in last months  Has to rush to get the bathroom, occ incontinence.  Feels emptying completely, no straining. No dribbling.  No dysuria, no hematuria.  No abd pain, no flank pain, no fever.   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  No checking blood sugar lately.   Using advil for back pain. Minimal caffeine, soda, little ETOH.     A1C in office today 9.6! He forgotting 2-3 times a week.  ON metformin,  glipix=zide regualrly.   Cannot afford the semaglutide  Has been seeing Christian Petty. Pharmacist. Relevant past medical, surgical, family and social history reviewed and updated as indicated. Interim medical history since our last visit reviewed. Allergies and medications reviewed and updated. Outpatient Medications Prior to Visit  Medication Sig Dispense Refill   atorvastatin (LIPITOR) 40 MG tablet TAKE 1 TABLET BY MOUTH EVERY DAY FOR CHOLESTEROL 90 tablet 2   azithromycin (ZITHROMAX) 250 MG tablet 2 tab po x 1 day then 1 tab po daily 6 tablet 0   benzonatate (TESSALON) 200 MG capsule Take 1 capsule (200 mg total) by mouth 2 (two) times daily as needed for cough. 20 capsule 0   Continuous Blood Gluc Sensor (FREESTYLE LIBRE 3 SENSOR) MISC 1 Device by Does not apply route continuous. Place 1 sensor on the skin every 14 days. Use to check glucose continuously 6 each 1   CONTOUR NEXT TEST test strip Use as  instructed 100 strip 2   fenofibrate 160 MG tablet TAKE 1 TABLET(160 MG) BY MOUTH DAILY FOR CHOLESTEROL 90 tablet 1   glipiZIDE (GLUCOTROL) 10 MG tablet TAKE 1 TABLET BY MOUTH TWICE DAILY BEFORE A MEAL FOR DIABETES 180 tablet 1   guaiFENesin-codeine (ROBITUSSIN AC) 100-10 MG/5ML syrup Take 5-10 mLs by mouth at bedtime as needed for cough. 180 mL 0   insulin glargine (LANTUS SOLOSTAR) 100 UNIT/ML Solostar Pen ADMINISTER 45 UNITS UNDER THE SKIN AT BEDTIME FOR DIABETES 45 mL 1   Insulin Pen Needle (CAREFINE PEN NEEDLES) 31G X 8 MM MISC Use with insulin once nightly at bedtime. 100 each 3   Lancets MISC Use as instructed to test blood sugar daily 100 each 0   losartan (COZAAR) 100 MG tablet TAKE 1 TABLET BY MOUTH EVERY DAY FOR BLOOD PRESSURE 90 tablet 1   metFORMIN (GLUCOPHAGE) 1000 MG tablet TAKE 1 TABLET BY MOUTH TWICE DAILY WITH MEAL FOR DIABETES 180 tablet 1   metoprolol succinate (TOPROL-XL) 50 MG 24 hr tablet Take 1 tablet (50 mg total) by mouth daily. Please contact the office to schedule appointment for additional refills. 1st Attempt. 30 tablet 0   Semaglutide,0.25 or 0.'5MG'$ /DOS, (OZEMPIC, 0.25 OR 0.5 MG/DOSE,) 2 MG/3ML SOPN Inject 0.25 mg into the  skin once weekly for 4 weeks, then increase to 0.5 mg once weekly thereafter for diabetes. 9 mL 0   tamsulosin (FLOMAX) 0.4 MG CAPS capsule TAKE 1 CAPSULE BY MOUTH ONCE DAILY AFTER BREAKFAST FOR URINE FLOW 90 capsule 1   No facility-administered medications prior to visit.     Per HPI unless specifically indicated in ROS section below Review of Systems  Constitutional:  Negative for fatigue and fever.  HENT:  Negative for ear pain.   Eyes:  Negative for pain.  Respiratory:  Negative for cough and shortness of breath.   Cardiovascular:  Negative for chest pain, palpitations and leg swelling.  Gastrointestinal:  Negative for abdominal pain.  Genitourinary:  Negative for dysuria.  Musculoskeletal:  Negative for arthralgias.  Neurological:   Negative for syncope, light-headedness and headaches.  Psychiatric/Behavioral:  Negative for dysphoric mood.    Objective:  BP 110/60   Pulse 81   Temp 98 F (36.7 C) (Oral)   Ht '5\' 11"'$  (1.803 m)   Wt 209 lb 2 oz (94.9 kg)   SpO2 96%   BMI 29.17 kg/m   Wt Readings from Last 3 Encounters:  06/01/22 209 lb 2 oz (94.9 kg)  03/24/22 204 lb 6.4 oz (92.7 kg)  12/16/21 207 lb (93.9 kg)      Physical Exam Constitutional:      Appearance: He is well-developed.  HENT:     Head: Normocephalic.     Right Ear: Hearing normal.     Left Ear: Hearing normal.     Nose: Nose normal.  Neck:     Thyroid: No thyroid mass or thyromegaly.     Vascular: No carotid bruit.     Trachea: Trachea normal.  Cardiovascular:     Rate and Rhythm: Normal rate and regular rhythm.     Pulses: Normal pulses.     Heart sounds: Heart sounds not distant. No murmur heard.    No friction rub. No gallop.     Comments: No peripheral edema Pulmonary:     Effort: Pulmonary effort is normal. No respiratory distress.     Breath sounds: Normal breath sounds.  Skin:    General: Skin is warm and dry.     Findings: No rash.  Psychiatric:        Speech: Speech normal.        Behavior: Behavior normal.        Thought Content: Thought content normal.       Results for orders placed or performed in visit on 06/01/22  POCT Urinalysis Dipstick (Automated)  Result Value Ref Range   Color, UA Yellow    Clarity, UA Clear    Glucose, UA Positive (A) Negative   Bilirubin, UA Negative    Ketones, UA Negative    Spec Grav, UA 1.025 1.010 - 1.025   Blood, UA Negative    pH, UA 5.0 5.0 - 8.0   Protein, UA Positive (A) Negative   Urobilinogen, UA 0.2 0.2 or 1.0 E.U./dL   Nitrite, UA Negative    Leukocytes, UA Negative Negative     COVID 19 screen:  No recent travel or known exposure to COVID19 The patient denies respiratory symptoms of COVID 19 at this time. The importance of social distancing was discussed today.    Assessment and Plan Problem List Items Addressed This Visit     Type 2 diabetes mellitus with hyperglycemia, with long-term current use of insulin ([place])   Relevant Orders   POCT glycosylated hemoglobin (  Hb A1C) (Completed)   Urinary frequency - Primary     Acute, no sign of infeciton. Potentially secondary to poorly controlled diabetes versus overactive bladder versus bladder irritation. Drink water, avoid bladder  irritants. Make sure taking insulin every day. Work on low [employer].  Can try trial of Myrbetriq if not improving with sugar control.      Relevant Orders   POCT Urinalysis Dipstick (Automated) (Completed)   POCT glycosylated hemoglobin (Hb A1C) (Completed)   Meds ordered this encounter  Medications   DISCONTD: mirabegron ER (MYRBETRIQ) 25 MG TB24 tablet    Sig: Take 1 tablet (25 mg total) by mouth daily.    Dispense:  30 tablet    Refill:  [place], MD

## 2022-06-22 ENCOUNTER — Encounter: Payer: Medicare Other | Admitting: Primary Care

## 2022-06-29 ENCOUNTER — Encounter: Payer: Self-pay | Admitting: Primary Care

## 2022-06-29 ENCOUNTER — Ambulatory Visit: Payer: Medicare Other | Admitting: Primary Care

## 2022-06-29 ENCOUNTER — Ambulatory Visit (INDEPENDENT_AMBULATORY_CARE_PROVIDER_SITE_OTHER): Payer: Medicare Other | Admitting: Primary Care

## 2022-06-29 VITALS — BP 122/68 | HR 102 | Temp 98.2°F | Ht 71.0 in | Wt 206.0 lb

## 2022-06-29 DIAGNOSIS — Z0000 Encounter for general adult medical examination without abnormal findings: Secondary | ICD-10-CM | POA: Diagnosis not present

## 2022-06-29 DIAGNOSIS — E1165 Type 2 diabetes mellitus with hyperglycemia: Secondary | ICD-10-CM | POA: Diagnosis not present

## 2022-06-29 DIAGNOSIS — Z794 Long term (current) use of insulin: Secondary | ICD-10-CM | POA: Diagnosis not present

## 2022-06-29 DIAGNOSIS — M545 Low back pain, unspecified: Secondary | ICD-10-CM | POA: Diagnosis not present

## 2022-06-29 DIAGNOSIS — N400 Enlarged prostate without lower urinary tract symptoms: Secondary | ICD-10-CM

## 2022-06-29 DIAGNOSIS — I421 Obstructive hypertrophic cardiomyopathy: Secondary | ICD-10-CM | POA: Diagnosis not present

## 2022-06-29 DIAGNOSIS — I10 Essential (primary) hypertension: Secondary | ICD-10-CM | POA: Diagnosis not present

## 2022-06-29 DIAGNOSIS — G8929 Other chronic pain: Secondary | ICD-10-CM | POA: Diagnosis not present

## 2022-06-29 DIAGNOSIS — E7800 Pure hypercholesterolemia, unspecified: Secondary | ICD-10-CM | POA: Diagnosis not present

## 2022-06-29 LAB — MICROALBUMIN / CREATININE URINE RATIO
Creatinine,U: 121.3 mg/dL
Microalb Creat Ratio: 2 mg/g (ref 0.0–30.0)
Microalb, Ur: 2.5 mg/dL — ABNORMAL HIGH (ref 0.0–1.9)

## 2022-06-29 MED ORDER — SEMAGLUTIDE (1 MG/DOSE) 4 MG/3ML ~~LOC~~ SOPN: 1.0000 mg | PEN_INJECTOR | SUBCUTANEOUS | 0 refills | Status: DC

## 2022-06-29 NOTE — Patient Instructions (Addendum)
Stop by the lab prior to leaving today. I will notify you of your results once received.   We increased the dose of your Ozempic to 1 mg weekly.  Start this after you finish your last 0.5 mg dose of Ozempic.  You will either be contacted via phone regarding your referral to GI for the colonoscopy. Please let us know if you have not been contacted by anyone within two weeks.  Schedule a follow-up visit for diabetes check in mid to late February 2024.  It was a pleasure to see you today!  Preventive Care 64 Years and Older, Male Preventive care refers to lifestyle choices and visits with your health care provider that can promote health and wellness. Preventive care visits are also called wellness exams. What can I expect for my preventive care visit? Counseling During your preventive care visit, your health care provider may ask about your: Medical history, including: Past medical problems. Family medical history. History of falls. Current health, including: Emotional well-being. Home life and relationship well-being. Sexual activity. Memory and ability to understand (cognition). Lifestyle, including: Alcohol, nicotine or tobacco, and drug use. Access to firearms. Diet, exercise, and sleep habits. Work and work Statistician. Sunscreen use. Safety issues such as seatbelt and bike helmet use. Physical exam Your health care provider will check your: Height and weight. These may be used to calculate your BMI (body mass index). BMI is a measurement that tells if you are at a healthy weight. Waist circumference. This measures the distance around your waistline. This measurement also tells if you are at a healthy weight and may help predict your risk of certain diseases, such as type 2 diabetes and high blood pressure. Heart rate and blood pressure. Body temperature. Skin for abnormal spots. What immunizations do I need?  Vaccines are usually given at various ages, according to a  schedule. Your health care provider will recommend vaccines for you based on your age, medical history, and lifestyle or other factors, such as travel or where you work. What tests do I need? Screening Your health care provider may recommend screening tests for certain conditions. This may include: Lipid and cholesterol levels. Diabetes screening. This is done by checking your blood sugar (glucose) after you have not eaten for a while (fasting). Hepatitis C test. Hepatitis B test. HIV (human immunodeficiency virus) test. STI (sexually transmitted infection) testing, if you are at risk. Lung cancer screening. Colorectal cancer screening. Prostate cancer screening. Abdominal aortic aneurysm (AAA) screening. You may need this if you are a current or former smoker. Talk with your health care provider about your test results, treatment options, and if necessary, the need for more tests. Follow these instructions at home: Eating and drinking  Eat a diet that includes fresh fruits and vegetables, whole grains, lean protein, and low-fat dairy products. Limit your intake of foods with high amounts of sugar, saturated fats, and salt. Take vitamin and mineral supplements as recommended by your health care provider. Do not drink alcohol if your health care provider tells you not to drink. If you drink alcohol: Limit how much you have to 0-2 drinks a day. Know how much alcohol is in your drink. In the [place] one drink equals one 12 oz bottle of beer (355 mL), one 5 oz glass of wine (148 mL), or one 1 oz glass of hard liquor (44 mL). Lifestyle Brush your teeth every morning and night with fluoride toothpaste. Floss one time each day. Exercise for at least 30 minutes  5 or more days each week. Do not use any products that contain nicotine or tobacco. These products include cigarettes, chewing tobacco, and vaping devices, such as e-cigarettes. If you need help quitting, ask your health care provider. Do  not use drugs. If you are sexually active, practice safe sex. Use a condom or other form of protection to prevent STIs. Take aspirin only as told by your health care provider. Make sure that you understand how much to take and what form to take. Work with your health care provider to find out whether it is safe and beneficial for you to take aspirin daily. Ask your health care provider if you need to take a cholesterol-lowering medicine (statin). Find healthy ways to manage stress, such as: Meditation, yoga, or listening to music. Journaling. Talking to a trusted person. Spending time with friends and family. Safety Always wear your seat belt while driving or riding in a vehicle. Do not drive: If you have been drinking alcohol. Do not ride with someone who has been drinking. When you are tired or distracted. While texting. If you have been using any mind-altering substances or drugs. Wear a helmet and other protective equipment during sports activities. If you have firearms in your house, make sure you follow all gun safety procedures. Minimize exposure to UV radiation to reduce your risk of skin cancer. What's next? Visit your health care provider once a year for an annual wellness visit. Ask your health care provider how often you should have your eyes and teeth checked. Stay up to date on all vaccines. This information is not intended to replace advice given to you by your health care provider. Make sure you discuss any questions you have with your health care provider. Document Revised: 12/31/2020 Document Reviewed: 12/31/2020 Elsevier Patient Education  [place].

## 2022-06-29 NOTE — Progress Notes (Signed)
Subjective:    Patient ID: Christian Petty, male    DOB: May 03, 1956, 66 y.o.   MRN: [id number]  HPI  Christian Petty is a very pleasant 66 y.o. male who presents today for complete physical and follow up of chronic conditions.  He has not been taking his afternoon doses of Glipizide and metformin for months. He began his Ozempic about 3-4 weeks ago. He is checking his glucose readings infrequently which are running in the high 100's.   Immunizations: -Tetanus: 2015 -Influenza: Completed this season   -Shingles: Has not completed  -Pneumonia: 2016, unknown   Diet: Fair diet. He has recently increased consumption of salads.  Exercise: No regular exercise.  Eye exam: Completes annually  Dental exam: Completes semi-annually   Colonoscopy: Completed in 2018, due 2021 and never completed  PSA: Due    BP Readings from Last 3 Encounters:  06/29/22 122/68  06/01/22 110/60  03/24/22 (!) 90/58       Review of Systems  Constitutional:  Negative for unexpected weight change.  HENT:  Negative for rhinorrhea.   Respiratory:  Negative for cough and shortness of breath.   Cardiovascular:  Negative for chest pain.  Gastrointestinal:  Negative for constipation and diarrhea.  Genitourinary:  Negative for difficulty urinating.  Musculoskeletal:  Negative for arthralgias and myalgias.  Skin:  Negative for rash.  Allergic/Immunologic: Negative for environmental allergies.  Neurological:  Negative for dizziness, numbness and headaches.  Psychiatric/Behavioral:  The patient is not nervous/anxious.          Past Medical History:  Diagnosis Date   Allergy    Arthritis    BPH (benign prostatic hyperplasia)    Chronic back pain    HTN (hypertension)    states under control with med., has been on med. x 2-3 yr.   Hyperlipidemia, mild    Insulin dependent diabetes mellitus    Trigger thumb of left hand 10/2017    Social History   Socioeconomic History   Marital status: Married    Spouse  name: Not on file   Number of children: 2   Years of education: Not on file   Highest education level: Not on file  Occupational History   Occupation: truck driver   Tobacco Use   Smoking status: Former    Packs/day: 0.00    Types: Cigarettes    Quit date: 11/16/2010    Years since quitting: 11.6   Smokeless tobacco: Never  Vaping Use   Vaping Use: Never used  Substance and Sexual Activity   Alcohol use: Yes    Comment: occasionally   Drug use: No   Sexual activity: Not on file  Other Topics Concern   Not on file  Social History Narrative   Married.   2 children, 2 grandchildren.   Works for himself as a Administrator.   Social Determinants of Health   Financial Resource Strain: Not on file  Food Insecurity: Not on file  Transportation Needs: Not on file  Physical Activity: Not on file  Stress: Not on file  Social Connections: Not on file  Intimate Partner Violence: Not on file    Past Surgical History:  Procedure Laterality Date   COLONOSCOPY  06/19/2006   COLONOSCOPY WITH PROPOFOL  09/28/2016   INGUINAL HERNIA REPAIR     LUMBAR LAMINECTOMY     x 2   POLYPECTOMY     TRIGGER FINGER RELEASE Right 10/04/2017   Procedure: RELEASE TRIGGER THUMB, RIGHT;  Surgeon: Marchia Bond, MD;  Location: [facility];  Service: Orthopedics;  Laterality: Right;    Family History  Problem Relation Age of Onset   Diabetes Father    Breast cancer Mother    Stroke Other 58       F   Colon cancer Neg Hx    Colon polyps Neg Hx    Esophageal cancer Neg Hx    Rectal cancer Neg Hx    Stomach cancer Neg Hx     Not on File  Current Outpatient Medications on File Prior to Visit  Medication Sig Dispense Refill   atorvastatin (LIPITOR) 40 MG tablet TAKE 1 TABLET BY MOUTH EVERY DAY FOR CHOLESTEROL 90 tablet 2   Continuous Blood Gluc Sensor (FREESTYLE LIBRE 3 SENSOR) MISC 1 Device by Does not apply route continuous. Place 1 sensor on the skin every 14 days. Use to check  glucose continuously 6 each 1   CONTOUR NEXT TEST test strip Use as instructed 100 strip 2   fenofibrate 160 MG tablet TAKE 1 TABLET(160 MG) BY MOUTH DAILY FOR CHOLESTEROL 90 tablet 1   glipiZIDE (GLUCOTROL) 10 MG tablet TAKE 1 TABLET BY MOUTH TWICE DAILY BEFORE A MEAL FOR DIABETES 180 tablet 1   guaiFENesin-codeine (ROBITUSSIN AC) 100-10 MG/5ML syrup Take 5-10 mLs by mouth at bedtime as needed for cough. 180 mL 0   insulin glargine (LANTUS SOLOSTAR) 100 UNIT/ML Solostar Pen ADMINISTER 45 UNITS UNDER THE SKIN AT BEDTIME FOR DIABETES 45 mL 1   Insulin Pen Needle (CAREFINE PEN NEEDLES) 31G X 8 MM MISC Use with insulin once nightly at bedtime. 100 each 3   Lancets MISC Use as instructed to test blood sugar daily 100 each 0   losartan (COZAAR) 100 MG tablet TAKE 1 TABLET BY MOUTH EVERY DAY FOR BLOOD PRESSURE 90 tablet 1   metFORMIN (GLUCOPHAGE) 1000 MG tablet TAKE 1 TABLET BY MOUTH TWICE DAILY WITH MEAL FOR DIABETES 180 tablet 1   metoprolol succinate (TOPROL-XL) 50 MG 24 hr tablet Take 1 tablet (50 mg total) by mouth daily. Please contact the office to schedule appointment for additional refills. 1st Attempt. 30 tablet 0   tamsulosin (FLOMAX) 0.4 MG CAPS capsule TAKE 1 CAPSULE BY MOUTH ONCE DAILY AFTER BREAKFAST FOR URINE FLOW 90 capsule 1   No current facility-administered medications on file prior to visit.    BP 122/68   Pulse (!) 102   Temp 98.2 F (36.8 C) (Temporal)   Ht '5\' 11"'$  (1.803 m)   Wt 206 lb (93.4 kg)   SpO2 98%   BMI 28.73 kg/m  Objective:   Physical Exam HENT:     Right Ear: Tympanic membrane and ear canal normal.     Left Ear: Tympanic membrane and ear canal normal.     Nose: Nose normal.     Right Sinus: No maxillary sinus tenderness or frontal sinus tenderness.     Left Sinus: No maxillary sinus tenderness or frontal sinus tenderness.  Eyes:     Conjunctiva/sclera: Conjunctivae normal.  Neck:     Thyroid: No thyromegaly.     Vascular: No carotid bruit.   Cardiovascular:     Rate and Rhythm: Normal rate and regular rhythm.     Heart sounds: Normal heart sounds.  Pulmonary:     Effort: Pulmonary effort is normal.     Breath sounds: Normal breath sounds. No wheezing or rales.  Abdominal:     General: Bowel sounds are normal.     Palpations: Abdomen is  soft.     Tenderness: There is no abdominal tenderness.  Musculoskeletal:        General: Normal range of motion.     Cervical back: Neck supple.  Skin:    General: Skin is warm and dry.  Neurological:     Mental Status: He is alert and oriented to person, place, and time.     Cranial Nerves: No cranial nerve deficit.     Deep Tendon Reflexes: Reflexes are normal and symmetric.  Psychiatric:        Mood and Affect: Mood normal.           Assessment & Plan:   Problem List Items Addressed This Visit       Cardiovascular and Mediastinum   Essential hypertension    Controlled.  Continue metoprolol succinate 50 mg daily, losartan 100 mg daily. CMP reviewed from May 2023.      HOCM (hypertrophic obstructive cardiomyopathy) [facility])    Following with cardiology, office notes reviewed from January 2023.  Continue blood pressure control, continue to metoprolol succinate 50 mg daily.        Endocrine   Type 2 diabetes mellitus with hyperglycemia, with long-term current use of insulin ([place])    Uncontrolled with A1c of 9.6 in November 2023.  Continue glipizide 10 mg twice daily, metformin 1000 mg twice daily, Lantus 45 units daily. Continue Ozempic 0.5 mg weekly for one more week, then increase to 1 mg weekly thereafter.  New Rx sent to pharmacy.  Long discussion regarding the absolute need to start checking blood glucose levels at least twice daily.  Follow up in 2 months.      Relevant Medications   Semaglutide, 1 MG/DOSE, 4 MG/3ML SOPN   Other Relevant Orders   Microalbumin/Creatinine Ratio, Urine     Genitourinary   BPH (benign prostatic hyperplasia)     Controlled.  Continue tamsulosin 0.4 mg daily.        Other   Hyperlipidemia    Continue atorvastatin 40 mg daily and fenofibrate 160 mg daily  Reviewed lipids from May 2023.      Chronic back pain    Controlled. No concerns today.  Continue to monitor.      Preventative health care - Primary    Immunizations UTD.  Declines influenza vaccine. Colonoscopy due, referral placed to GI. PSA due, will collect at next visit.  Discussed the importance of a healthy diet and regular exercise in order for weight loss, and to reduce the risk of further co-morbidity.  Exam stable. Labs pending.  Follow up in 1 year for repeat physical.           Pleas Koch, NP

## 2022-06-29 NOTE — Assessment & Plan Note (Signed)
Immunizations UTD.  Declines influenza vaccine. Colonoscopy due, referral placed to GI. PSA due, will collect at next visit.  Discussed the importance of a healthy diet and regular exercise in order for weight loss, and to reduce the risk of further co-morbidity.  Exam stable. Labs pending.  Follow up in 1 year for repeat physical.

## 2022-06-29 NOTE — Assessment & Plan Note (Signed)
Following with cardiology, office notes reviewed from January 2023.  Continue blood pressure control, continue to metoprolol succinate 50 mg daily.

## 2022-06-29 NOTE — Assessment & Plan Note (Signed)
Controlled.  Continue tamsulosin 0.4 mg daily.

## 2022-06-29 NOTE — Assessment & Plan Note (Signed)
Controlled. No concerns today.  Continue to monitor.

## 2022-06-29 NOTE — Assessment & Plan Note (Signed)
Controlled.  Continue metoprolol succinate 50 mg daily, losartan 100 mg daily. CMP reviewed from May 2023.

## 2022-06-29 NOTE — Assessment & Plan Note (Signed)
Uncontrolled with A1c of 9.6 in November 2023.  Continue glipizide 10 mg twice daily, metformin 1000 mg twice daily, Lantus 45 units daily. Continue Ozempic 0.5 mg weekly for one more week, then increase to 1 mg weekly thereafter.  New Rx sent to pharmacy.  Long discussion regarding the absolute need to start checking blood glucose levels at least twice daily.  Follow up in 2 months.

## 2022-06-29 NOTE — Assessment & Plan Note (Addendum)
Continue atorvastatin 40 mg daily and fenofibrate 160 mg daily  Reviewed lipids from May 2023.

## 2022-07-05 DIAGNOSIS — R350 Frequency of micturition: Secondary | ICD-10-CM | POA: Insufficient documentation

## 2022-07-05 NOTE — Assessment & Plan Note (Signed)
Acute, no sign of infeciton. Potentially secondary to poorly controlled diabetes versus overactive bladder versus bladder irritation. Drink water, avoid bladder  irritants. Make sure taking insulin every day. Work on low [employer].  Can try trial of Myrbetriq if not improving with sugar control.

## 2022-07-14 ENCOUNTER — Other Ambulatory Visit: Payer: Self-pay | Admitting: Primary Care

## 2022-07-14 DIAGNOSIS — I10 Essential (primary) hypertension: Secondary | ICD-10-CM

## 2022-07-14 DIAGNOSIS — N400 Enlarged prostate without lower urinary tract symptoms: Secondary | ICD-10-CM

## 2022-08-04 ENCOUNTER — Other Ambulatory Visit: Payer: Self-pay | Admitting: Primary Care

## 2022-08-04 DIAGNOSIS — E118 Type 2 diabetes mellitus with unspecified complications: Secondary | ICD-10-CM

## 2022-08-04 DIAGNOSIS — E119 Type 2 diabetes mellitus without complications: Secondary | ICD-10-CM

## 2022-08-08 ENCOUNTER — Other Ambulatory Visit: Payer: Self-pay | Admitting: Primary Care

## 2022-08-08 DIAGNOSIS — I421 Obstructive hypertrophic cardiomyopathy: Secondary | ICD-10-CM

## 2022-08-08 DIAGNOSIS — E1165 Type 2 diabetes mellitus with hyperglycemia: Secondary | ICD-10-CM

## 2022-08-08 DIAGNOSIS — E7800 Pure hypercholesterolemia, unspecified: Secondary | ICD-10-CM

## 2022-08-08 DIAGNOSIS — N400 Enlarged prostate without lower urinary tract symptoms: Secondary | ICD-10-CM

## 2022-08-16 ENCOUNTER — Telehealth: Payer: Self-pay

## 2022-08-16 ENCOUNTER — Ambulatory Visit: Payer: Medicare Other

## 2022-08-16 ENCOUNTER — Other Ambulatory Visit: Payer: Self-pay | Admitting: Primary Care

## 2022-08-16 DIAGNOSIS — Z794 Long term (current) use of insulin: Secondary | ICD-10-CM

## 2022-08-16 NOTE — Progress Notes (Signed)
Care Management & Coordination Services Pharmacy Team  Reason for Encounter: Appointment Reminder  Contacted patient to confirm in office appointment with Charlene Brooke, PharmD, on 08/19/2022 at 11:00.  Spoke with patient on 08/16/2022   Do you have any problems getting your medications? No  What is your top health concern you would like to discuss at your upcoming visit? No concerns  Have you seen any other providers since your last visit with PCP? No  Star Rating Drugs:  Medication:  Last Fill: Day Supply Atorvastatin 40 mg 07/14/22 90 Glipizide 10 mg 04/07/22 90 - Rx too old; need new script Losartan 100 mg 07/15/22 90 Metformin 1000 mg 08/05/22 90 Ozempic 1 mg  07/09/22 28 - on backorder Insulin Needles No script on file Verified with [facility]  Patient asked me to call [facility] to request refill of Glipizide, Ozempic and insulin needles  Care Gaps: Annual wellness visit in last year? No Patient has appt on 08/16/22  If Diabetic: Last eye exam / retinopathy screening: Up to date Last diabetic foot exam: Up to date  Charlene Brooke, PharmD notified  Marijean Niemann, [place] Assistant [phone]

## 2022-08-19 ENCOUNTER — Telehealth: Payer: Self-pay | Admitting: Pharmacist

## 2022-08-19 ENCOUNTER — Ambulatory Visit: Payer: Medicare Other | Admitting: Pharmacist

## 2022-08-19 DIAGNOSIS — E119 Type 2 diabetes mellitus without complications: Secondary | ICD-10-CM

## 2022-08-19 MED ORDER — CAREFINE PEN NEEDLES 31G X 8 MM MISC: 3 refills | Status: AC

## 2022-08-19 NOTE — Telephone Encounter (Signed)
Patient requests refills for Ozempic 1 mg and insulin pen needles.  He reports he has lost 15 lbs with Ozempic and reports improvement in glucose, although he does not have any readings written down and cannot recall numbers when prompted. He does deny signs/symptoms of hypoglycemia.  Pharmacy: [facility] [place], [place] - [address] [place] [address] Phone: [phone] Fax: [phone]

## 2022-08-19 NOTE — Telephone Encounter (Signed)
I sent a 3 month supply of Ozempic on 06/29/22, so he should have enough for now. We have an appointment scheduled for later this month.  Refilled pen needles.

## 2022-08-19 NOTE — Telephone Encounter (Signed)
Called patient and reviewed all information. Patient verbalized understanding. Will call if any further questions.  

## 2022-08-19 NOTE — Patient Instructions (Signed)
Visit Information  Phone number for Pharmacist: [phone]  Thank you for meeting with me to discuss your medications! Below is a summary of what we talked about during the visit:   Recommendations/Changes made from today's visit: -No med changes -Pursue Ozempic PAP for cost savings -Reviewed purpose of metoprolol and need for f/u with cardiology; routing to Dr Marlou Porch so he is aware pt is off of metoprolol  Follow up plan: -[place] will follow up with Ozempic PAP -Pharmacist follow up televisit scheduled for 3 months -PCP appt 09/10/22   Charlene Brooke, PharmD, BCACP Clinical Pharmacist [facility] [phone]

## 2022-08-19 NOTE — Progress Notes (Signed)
Care Management & Coordination Services Pharmacy Note  08/19/2022 Name:  Christian Petty MRN:  [id number] DOB:  10/04/1955  Summary: Initial visit -DM: A1c 9.6 (05/2022) elevated; pt has since restarted Ozempic, he reports ~15 lb wt loss since then and improvement in glucose (he checks ~once a week and does not recall numbers); he declines CGM -HTN/HOCM: pt stopped taking metoprolol ~Nov 2023 when prescription ran out; this was initially prescribed by cardiology for HOCM, pt has not seen cardiology since 07/2021 and was advised to continue metoprolol at that time  Recommendations/Changes made from today's visit: -No med changes -Pursue Ozempic PAP for cost savings - submitted online -Reviewed purpose of metoprolol and need for f/u with cardiology; routing to Dr Marlou Porch so he is aware pt is off of metoprolol  Follow up plan: -[place] will follow up with Ozempic PAP -Pharmacist follow up televisit scheduled for 3 months -PCP appt 09/10/22    Subjective: Christian Petty is an 67 y.o. year old male who is a primary patient of Pleas Koch, NP.  The care coordination team was consulted for assistance with disease management and care coordination needs.    Engaged with patient face to face for initial visit. 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 He is on the road for 4-5 days at a time, he does bring all medications with him but often forgets doses due to irregular schedule.   Recent office visits: 06/29/22 NP Allie Bossier OV: annual - pt has not been taking PM doses of metformin and glipizide. Began Ozempic 3-4 weeks ago. Check BG twice daily. Increase Ozempic to 1 mg. Referred for colonoscopy. F/u Feb 2024.  06/01/22 Dr Diona Browner OV: urinary frequency - Rx Myrbetriq.  03/24/22 Dr Diona Browner OV: acute cough - rx zpak, benzonatate, mucinex-DM.  Recent consult visits: 07/22/21 Dr Marlou Porch [facility] (Cardiology): HOCM - ZIO monitor reassuring. Rare PVC.  Continue Toprol.  Hospital visits: None in previous 6 months   Objective:  Lab Results  Component Value Date   CREATININE 1.36 12/16/2021   BUN 25 (H) 12/16/2021   GFR 54.05 (L) 12/16/2021   EGFR 58 (L) 05/11/2021   GFRNONAA >60 11/02/2017   GFRAA >60 11/02/2017   NA 135 12/16/2021   K 4.4 12/16/2021   CALCIUM 10.5 12/16/2021   CO2 27 12/16/2021   GLUCOSE 187 (H) 12/16/2021    Lab Results  Component Value Date/Time   HGBA1C 9.6 (A) 06/01/2022 11:02 AM   HGBA1C 7.8 (A) 12/16/2021 12:56 PM   HGBA1C 8.6 (H) 10/03/2017 08:28 AM   HGBA1C 9.2 (H) 03/31/2017 08:18 AM   GFR 54.05 (L) 12/16/2021 01:33 PM   GFR 53.45 (L) 01/28/2021 12:18 PM   MICROALBUR 2.5 (H) 06/29/2022 12:36 PM   MICROALBUR 2.9 (H) 07/29/2016 09:03 AM    Last diabetic Eye exam:  Lab Results  Component Value Date/Time   HMDIABEYEEXA No Retinopathy 09/21/2021 12:00 AM    Last diabetic Foot exam:  Lab Results  Component Value Date/Time   HMDIABFOOTEX yes 05/03/2010 12:00 AM     Lab Results  Component Value Date   CHOL 140 12/16/2021   HDL 26.50 (L) 12/16/2021   LDLCALC 70 09/09/2020   LDLDIRECT 89.0 12/16/2021   TRIG 236.0 (H) 12/16/2021   CHOLHDL 5 12/16/2021       Latest Ref Rng & Units 12/16/2021    1:33 PM 09/09/2020   11:07 AM 06/07/2019   12:01 PM  Hepatic Function  Total  Protein 6.0 - 8.3 g/dL 7.3  6.7  6.8   Albumin 3.5 - 5.2 g/dL 4.4  4.2  4.4   AST 0 - 37 U/L '14  13  13   '$ ALT 0 - 53 U/L '16  17  24   '$ Alk Phosphatase 39 - 117 U/L 27  33  31   Total Bilirubin 0.2 - 1.2 mg/dL 0.5  0.8  0.5     Lab Results  Component Value Date/Time   TSH 1.47 01/28/2021 12:18 PM   TSH 1.10 10/20/2011 10:54 AM       Latest Ref Rng & Units 12/16/2021    1:33 PM 05/11/2021    1:37 PM 01/28/2021   12:18 PM  CBC  WBC 4.0 - 10.5 K/uL 8.3  7.5  8.2   Hemoglobin 13.0 - 17.0 g/dL 13.2  13.6  11.2   Hematocrit 39.0 - 52.0 % 40.2  41.2  33.7   Platelets 150.0 - 400.0 K/uL 339.0  381  385.0     No  results found for: "VD25OH", "VITAMINB12"  Clinical ASCVD: No  The 10-year ASCVD risk score (Arnett DK, et al., 2019) is: 31.1%   Values used to calculate the score:     Age: 58 years     Sex: Male     Is Non-Hispanic African American: No     Diabetic: Yes     Tobacco smoker: No     Systolic Blood Pressure: 811 mmHg     Is BP treated: Yes     HDL Cholesterol: 26.5 mg/dL     Total Cholesterol: 140 mg/dL        12/16/2021   12:54 PM 12/09/2020    9:03 AM 01/18/2017    9:19 AM  Depression screen PHQ 2/9  Decreased Interest 0 0 0  Down, Depressed, Hopeless 0 0 0  PHQ - 2 Score 0 0 0  Altered sleeping 1 1   Tired, decreased energy 1 0   Change in appetite 0 2   Feeling bad or failure about yourself  0 0   Trouble concentrating 0 0   Moving slowly or fidgety/restless 0 0   Suicidal thoughts 0 0   PHQ-9 Score 2 3   Difficult doing work/chores Not difficult at all Not difficult at all      Social History   Tobacco Use  Smoking Status Former   Packs/day: 0.00   Types: Cigarettes   Quit date: 11/16/2010   Years since quitting: 11.7  Smokeless Tobacco Never   BP Readings from Last 3 Encounters:  06/29/22 122/68  06/01/22 110/60  03/24/22 (!) 90/58   Pulse Readings from Last 3 Encounters:  06/29/22 (!) 102  06/01/22 81  03/24/22 85   Wt Readings from Last 3 Encounters:  06/29/22 206 lb (93.4 kg)  06/01/22 209 lb 2 oz (94.9 kg)  03/24/22 204 lb 6.4 oz (92.7 kg)   BMI Readings from Last 3 Encounters:  06/29/22 28.73 kg/m  06/01/22 29.17 kg/m  03/24/22 28.51 kg/m    Not on File  Medications Reviewed Today     Reviewed by Charlton Haws, [facility] (Pharmacist) on 08/19/22 at 1144  Med List Status: <None>   Medication Order Taking? Sig Documenting Provider Last Dose Status Informant  atorvastatin (LIPITOR) 40 MG tablet [id number] Yes TAKE 1 TABLET BY MOUTH EVERY DAY FOR CHOLESTEROL Pleas Koch, NP Taking Active   CONTOUR NEXT TEST test strip [id number] Yes  Use as instructed Alma Friendly  K, NP Taking Active   fenofibrate 160 MG tablet [id number] Yes TAKE 1 TABLET(160 MG) BY MOUTH DAILY FOR CHOLESTEROL Pleas Koch, NP Taking Active   glipiZIDE (GLUCOTROL) 10 MG tablet [id number] Yes TAKE 1 TABLET BY MOUTH TWICE DAILY BEFORE A MEAL FOR DIABETES Pleas Koch, NP Taking Active   guaiFENesin-codeine [facility]) 100-10 MG/5ML syrup [id number] Yes Take 5-10 mLs by mouth at bedtime as needed for cough. Jinny Sanders, MD Taking Active   insulin glargine (LANTUS SOLOSTAR) 100 UNIT/ML Solostar Pen [id number] Yes ADMINISTER 45 UNITS UNDER THE SKIN AT BEDTIME FOR DIABETES Pleas Koch, NP Taking Active   Insulin Pen Needle (CAREFINE PEN NEEDLES) 31G X 8 MM MISC [id number] Yes Use with insulin once nightly at bedtime. Pleas Koch, NP Taking Active   Lancets MISC [id number] Yes Use as instructed to test blood sugar daily Pleas Koch, NP Taking Active   losartan (COZAAR) 100 MG tablet [id number] Yes TAKE 1 TABLET BY MOUTH EVERY DAY FOR BLOOD PRESSURE Pleas Koch, NP Taking Active   metFORMIN (GLUCOPHAGE) 1000 MG tablet [id number] Yes TAKE 1 TABLET BY MOUTH TWICE DAILY WITH MEAL FOR DIABETES Pleas Koch, NP Taking Active   metoprolol succinate (TOPROL-XL) 50 MG 24 hr tablet [id number] No Take 1 tablet (50 mg total) by mouth daily. Please contact the office to schedule appointment for additional refills. 1st Attempt.  Patient not taking: Reported on 08/19/2022   Jerline Pain, MD Not Taking Active   Semaglutide, 1 MG/DOSE, 4 MG/3ML SOPN [id number] Yes Inject 1 mg as directed once a week. for diabetes. Pleas Koch, NP Taking Active   tamsulosin (FLOMAX) 0.4 MG CAPS capsule [id number] Yes TAKE ONE CAPSULE BY MOUTH ONCE AFTER BREAKFAST FOR URINE FLOW Pleas Koch, NP Taking Active             SDOH:  (Social Determinants of Health) assessments and interventions performed: Yes SDOH Interventions    Flowsheet Row  Care Coordination from 08/19/2022 in [place] Office Visit from 12/16/2021 in [place] at [place] Visit from 12/09/2020 in [place] at [place] Interventions Intervention Not Indicated -- --  Transportation Interventions Intervention Not Indicated -- --  Depression Interventions/Treatment  -- Counseling Counseling  Financial Strain Interventions Other (Comment)  [pursue Ozempic PAP] -- --       Medication Assistance:  Ozempic - [facility] PAP in process for 2024. Submitted online 08/19/22  Medication Access: Within the past 30 days, how often has patient missed a dose of medication? 0 Is a pillbox or other method used to improve adherence? No  Factors that may affect medication adherence? lack of understanding of disease management and irregular schedule (truck driver) Are meds synced by current pharmacy? No  Are meds delivered by current pharmacy? No  Does patient experience delays in picking up medications due to transportation concerns? No   Upstream Services Reviewed: Is patient disadvantaged to use [facility] Pharm[facility]?: No  Current Rx insurance plan: [facility] Name and location of Current pharmacy:  [facility] [place], [place] [address] [address] Phone: [phone] Fax: [phone]  [facility] Pharmacy services reviewed with patient today?: No  Patient requests to transfer care to Upstream Pharmacy?: No  Reason patient declined to change pharmacies: Not mentioned at this visit  Compliance/Adherence/Medication fill history: Care Gaps: AWV  Star-Rating Drugs: Atorvastatin - PDC 99% Glipizide - PDC 92% Losartan - PDC 91% Metformin - PDC 94% Ozempic - PDC 61% (LF 07/09/22 x 90 ds)   Assessment/Plan   Hypertension (BP goal <130/80) -Controlled - per clinic BP -Hx HOCM,  followed with cardiology (Dr Marlou Porch); Pt is no longer taking metoprolol - Rx expired ~05/2022 and pt did not renew it; of note HR was elevated in most recent OV (06/29/22 - P 102) -Pt is not checking BP at home -Current treatment: Losartan 100 mg daily - Appropriate, Effective, Safe, Accessible Metoprolol succinate 50 mg daily - not taking -Medications previously tried: n/a  -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 -Current exercise habits: limited -Educated on BP goals and benefits of medications for prevention of heart attack, stroke and kidney damage; -Reviewed indication for metoprolol - it was started 04/2021 by cardiology for HOCM; will consult with cardiology about whether he should continue indefinitely or whether he can stop it as he is asymptomatic -Counseled to monitor BP at home periodically -Recommended to continue current medication  Hyperlipidemia: (LDL goal < 70) -Not ideally controlled - LDL 89 (11/2021) above goal, LDL has been at goal on same regimen in the past; TRIG 236 improved from peak of 438 -ASCVD risk 31% -Current treatment: Atorvastatin 40 mg daily - Appropriate, Query Effective Fenofibrate 160 mg daily - Appropriate, Effective, Safe, Accessible -Medications previously tried: n/a  -Educated on Cholesterol goals; Benefits of statin for ASCVD risk reduction; Importance of limiting foods high in cholesterol; -Recommended to continue current medication  Diabetes (A1c goal <7%) -Uncontrolled - A1c 9.6% (05/2022), pt has restarted Ozempic since then and reports 16lbs wt loss and improvement in glucose readings; he checks BG about once a week when he thinks about it, he does not have any numbers available during call -Denies hypoglycemic/hyperglycemic symptoms -Current medications: Metformin 1000 mg BID - Appropriate, Query Effective Lantus 46 units HS -Appropriate, Query Effective Ozempic 1 mg weekly -Appropriate, Query Effective Glipizide  10 mg BID -Appropriate, Query Effective -Medications previously tried: n/a  -Educated on A1c and blood sugar goals; Complications of diabetes including kidney damage, retinal damage, and cardiovascular disease; Benefits of weight loss; -Reviewed diet - 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 He should qualify for Ozempic PAP, applied online to [employer]. -Recommend to continue current medication  Health Maintenance -Vaccine gaps: Shingrix, Prevnar, Flu   Charlene Brooke, PharmD, BCACP Clinical Pharmacist [facility] at [facility] [phone]

## 2022-08-28 ENCOUNTER — Other Ambulatory Visit: Payer: Self-pay | Admitting: Primary Care

## 2022-08-28 DIAGNOSIS — E782 Mixed hyperlipidemia: Secondary | ICD-10-CM

## 2022-08-31 NOTE — Telephone Encounter (Signed)
Received 4 pen of Ozempic from [facility]. Label  placed in refrigerator

## 2022-09-07 ENCOUNTER — Encounter: Payer: Self-pay | Admitting: Primary Care

## 2022-09-07 ENCOUNTER — Ambulatory Visit (INDEPENDENT_AMBULATORY_CARE_PROVIDER_SITE_OTHER): Payer: Medicare Other | Admitting: Primary Care

## 2022-09-07 VITALS — BP 146/60 | HR 105 | Temp 98.1°F | Ht 71.0 in | Wt 195.0 lb

## 2022-09-07 DIAGNOSIS — E118 Type 2 diabetes mellitus with unspecified complications: Secondary | ICD-10-CM

## 2022-09-07 DIAGNOSIS — E1165 Type 2 diabetes mellitus with hyperglycemia: Secondary | ICD-10-CM

## 2022-09-07 DIAGNOSIS — E119 Type 2 diabetes mellitus without complications: Secondary | ICD-10-CM

## 2022-09-07 DIAGNOSIS — Z794 Long term (current) use of insulin: Secondary | ICD-10-CM | POA: Diagnosis not present

## 2022-09-07 LAB — POCT GLYCOSYLATED HEMOGLOBIN (HGB A1C): Hemoglobin A1C: 6.2 % — AB (ref 4.0–5.6)

## 2022-09-07 MED ORDER — METFORMIN HCL 1000 MG PO TABS: 500.0000 mg | ORAL_TABLET | Freq: Two times a day (BID) | ORAL | 0 refills | Status: DC

## 2022-09-07 NOTE — Progress Notes (Signed)
Subjective:    Patient ID: Christian Petty, male    DOB: [birth year], 67 y.o.   MRN: [id number]  HPI  Christian Petty is a very pleasant 67 y.o. male with type 2 diabetes, hypertension, BPH, fatigue, hyperlipidemia who presents today for follow up of diabetes.  Current medications include: glipizide 10 mg BID, Lantus 45 units HS, metformin 1000 mg BID, Ozempic 1 mg weekly.   He has noticed increased gas and bloating since the increased dose of Ozempic. He cannot identify a trigger for his symptoms which are worse at night.   He is checking his blood glucose 2-4 times weekly and is getting readings of:  AM fasting: 160-220's.  No low readings below 80. Highest reading was 220.  Last A1C: 9.6 in November 2023, 6.2 today Last Eye Exam: UTD Last Foot Exam: Due Pneumonia Vaccination: 2016 Urine Microalbumin: UTD Statin: atorvastatin   Dietary changes since last visit: 1-2 meals daily, curbed appetite with Ozempic. Reducing sugar intake and avoids sodas.    Exercise: None.    BP Readings from Last 3 Encounters:  09/07/22 (!) 146/60  06/29/22 122/68  06/01/22 110/60      Review of Systems  Respiratory:  Negative for shortness of breath.   Cardiovascular:  Negative for chest pain.  Gastrointestinal:  Negative for nausea.       Increased gas and belching   Endocrine: Negative for polydipsia, polyphagia and polyuria.         Past Medical History:  Diagnosis Date   Allergy    Arthritis    BPH (benign prostatic hyperplasia)    Chronic back pain    HTN (hypertension)    states under control with med., has been on med. x 2-3 yr.   Hyperlipidemia, mild    Insulin dependent diabetes mellitus    Trigger thumb of left hand 10/2017    Social History   Socioeconomic History   Marital status: Married    Spouse name: Not on file   Number of children: 2   Years of education: Not on file   Highest education level: Not on file  Occupational History   Occupation: truck driver    Tobacco Use   Smoking status: Former    Packs/day: 0.00    Types: Cigarettes    Quit date: 11/16/2010    Years since quitting: 11.8   Smokeless tobacco: Never  Vaping Use   Vaping Use: Never used  Substance and Sexual Activity   Alcohol use: Yes    Comment: occasionally   Drug use: No   Sexual activity: Not on file  Other Topics Concern   Not on file  Social History Narrative   Married.   2 children, 2 grandchildren.   Works for himself as a Administrator.   Social Determinants of Health   Financial Resource Strain: Medium Risk (08/19/2022)   Overall Financial Resource Strain (CARDIA)    Difficulty of Paying Living Expenses: Somewhat hard  Food Insecurity: Not on file  Transportation Needs: No Transportation Needs (08/19/2022)   PRAPARE - Hydrologist (Medical): No    Lack of Transportation (Non-Medical): No  Physical Activity: Not on file  Stress: Not on file  Social Connections: Not on file  Intimate Partner Violence: Not on file    Past Surgical History:  Procedure Laterality Date   COLONOSCOPY  06/19/2006   COLONOSCOPY WITH PROPOFOL  09/28/2016   INGUINAL HERNIA REPAIR     LUMBAR LAMINECTOMY  x 2   POLYPECTOMY     TRIGGER FINGER RELEASE Right 10/04/2017   Procedure: RELEASE TRIGGER THUMB, RIGHT;  Surgeon: Marchia Bond, MD;  Location: [facility];  Service: Orthopedics;  Laterality: Right;    Family History  Problem Relation Age of Onset   Diabetes Father    Breast cancer Mother    Stroke Other 4       F   Colon cancer Neg Hx    Colon polyps Neg Hx    Esophageal cancer Neg Hx    Rectal cancer Neg Hx    Stomach cancer Neg Hx     Not on File  Current Outpatient Medications on File Prior to Visit  Medication Sig Dispense Refill   atorvastatin (LIPITOR) 40 MG tablet TAKE 1 TABLET BY MOUTH EVERY DAY FOR CHOLESTEROL 90 tablet 2   CONTOUR NEXT TEST test strip Use as instructed 100 strip 2   fenofibrate 160 MG  tablet TAKE 1 TABLET(160 MG) BY MOUTH DAILY FOR CHOLESTEROL 90 tablet 2   glipiZIDE (GLUCOTROL) 10 MG tablet TAKE 1 TABLET BY MOUTH TWICE DAILY BEFORE A MEAL FOR DIABETES 180 tablet 0   guaiFENesin-codeine (ROBITUSSIN AC) 100-10 MG/5ML syrup Take 5-10 mLs by mouth at bedtime as needed for cough. 180 mL 0   insulin glargine (LANTUS SOLOSTAR) 100 UNIT/ML Solostar Pen ADMINISTER 45 UNITS UNDER THE SKIN AT BEDTIME FOR DIABETES 45 mL 1   Insulin Pen Needle (CAREFINE PEN NEEDLES) 31G X 8 MM MISC Use with insulin once nightly at bedtime. 100 each 3   Lancets MISC Use as instructed to test blood sugar daily 100 each 0   losartan (COZAAR) 100 MG tablet TAKE 1 TABLET BY MOUTH EVERY DAY FOR BLOOD PRESSURE 90 tablet 3   metoprolol succinate (TOPROL-XL) 50 MG 24 hr tablet Take 1 tablet (50 mg total) by mouth daily. Please contact the office to schedule appointment for additional refills. 1st Attempt. 30 tablet 0   Semaglutide, 1 MG/DOSE, 4 MG/3ML SOPN Inject 1 mg as directed once a week. for diabetes. 9 mL 0   tamsulosin (FLOMAX) 0.4 MG CAPS capsule TAKE ONE CAPSULE BY MOUTH ONCE AFTER BREAKFAST FOR URINE FLOW 90 capsule 3   No current facility-administered medications on file prior to visit.    BP (!) 146/60   Pulse (!) 105   Temp 98.1 F (36.7 C) (Temporal)   Ht 5' 11"$  (1.803 m)   Wt 195 lb (88.5 kg)   SpO2 99%   BMI 27.20 kg/m  Objective:   Physical Exam Cardiovascular:     Rate and Rhythm: Normal rate and regular rhythm.  Pulmonary:     Effort: Pulmonary effort is normal.     Breath sounds: Normal breath sounds. No wheezing or rales.  Musculoskeletal:     Cervical back: Neck supple.  Skin:    General: Skin is warm and dry.  Neurological:     Mental Status: He is alert and oriented to person, place, and time.           Assessment & Plan:  Type 2 diabetes mellitus with hyperglycemia, with long-term current use of insulin (HCC) Assessment & Plan: Controlled and improved with A1C  reading of 6.2 today.  GI symptoms likely secondary to Ozempic dose adjustment from last visit. He does not wish to reduce dose. Will decrease metformin to see if symptoms improve.   Decrease Metformin to 500 mg twice daily. Continue Glipizide 10 mg twice daily,  Lantus 45  units daily and Ozempic 1 mg weekly.   Long discussion with patient about symptoms of hypoglycemia. Continue checking blood sugars and report consistent readings below 80.  Foot exam done today.  Urine ACR UTD.  Eye exam scheduled for march.  Managed on Statin. UTD on pneumonia vaccine.  Discussed the importance of continue to monitor diet and start exercising.  Follow up in 6 months.   I evaluated patient, was consulted regarding treatment, and agree with assessment and plan per Tinnie Gens, RN, DNP student.   Allie Bossier, NP-C   Orders: -     POCT glycosylated hemoglobin (Hb A1C)  Type 2 diabetes mellitus with complication, with long-term current use of insulin (HCC) -     metFORMIN HCl; Take 0.5 tablets (500 mg total) by mouth 2 (two) times daily with a meal. for diabetes.  Dispense: 90 tablet; Refill: 0  Type 2 diabetes mellitus without complication, without long-term current use of insulin (HCC) -     metFORMIN HCl; Take 0.5 tablets (500 mg total) by mouth 2 (two) times daily with a meal. for diabetes.  Dispense: 90 tablet; Refill: 0        Pleas Koch, NP

## 2022-09-07 NOTE — Assessment & Plan Note (Addendum)
Controlled and improved with A1C reading of 6.2 today.  GI symptoms likely secondary to Ozempic dose adjustment from last visit. He does not wish to reduce dose. Will decrease metformin to see if symptoms improve.   Decrease Metformin to 500 mg twice daily. Continue Glipizide 10 mg twice daily,  Lantus 45 units daily and Ozempic 1 mg weekly.   Long discussion with patient about symptoms of hypoglycemia. Continue checking blood sugars and report consistent readings below 80.  Foot exam done today.  Urine ACR UTD.  Eye exam scheduled for march.  Managed on Statin. UTD on pneumonia vaccine.  Discussed the importance of continue to monitor diet and start exercising.  Follow up in 6 months.   I evaluated patient, was consulted regarding treatment, and agree with assessment and plan per Tinnie Gens, RN, DNP student.   Allie Bossier, NP-C

## 2022-09-07 NOTE — Patient Instructions (Addendum)
Great job with you A1C.   Continue Insulin 45 units once daily, Metformin 500 mg twice daily, Glipizide 10 mg twice daily and Ozempic 1 mg once weekly.   Continue checking your blood sugars. Please report any blood sugars consistent readings below 80.   It is important that you improve your diet. Please limit carbohydrates in the form of white bread, rice, pasta, sweets, fast food, fried food, sugary drinks, etc. Increase your consumption of fresh fruits and vegetables, whole grains, lean protein.  Ensure you are consuming 64 ounces of water daily.   Follow up in 6 months.  It was a pleasure to see you today!

## 2022-09-07 NOTE — Progress Notes (Signed)
Established Patient Office Visit  Subjective   Patient ID: Christian Petty, male    DOB: 1955-09-06  Age: 67 y.o. MRN: [id number]  Chief Complaint  Patient presents with   Medical Management of Chronic Issues    Diabetes    HPI  Christian Petty is a 67 year old male with past medical history of diabetes, HTN, tobacco abuse, HLD presents today for diabetes.   Current medications include: Glipizide 10 mg twice daily, Metformin 1000 mg BID, Ozempic 1 mg once weekly and Lantus 45 units daily.   He is checking his blood glucose 2-4 times weekly and is getting readings fasting readings around 160-220. Denies any hypoglycemic events and any readings below 80.  Last A1C: 9.6 in November.  Last Eye Exam: Due in March. Last Foot Exam: Due  Pneumonia Vaccination: 2016 Urine Microalbumin: UTD Statin: Atorvastatin 40 mg  Dietary changes since last visit: He has been eating 1-2 meals a day. He has noticed that he is has been eating half of his meals and gets full easily. He drinks tea with splenda. He avoids sodas.   Exercise: no regular exercise. 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   He has gas and bloating at night. He feels that he is not able to complete his burp. The feeling lasts anywhere from 5 minutes to 2-3 hours. He has not noticed that there are no trigger foods. Symptom onset is not after a meal; can occur anytime during the day. Symptoms are worse at night.    Patient Active Problem List   Diagnosis Date Noted   Urinary frequency 07/05/2022   Preventative health care 06/29/2022   Type 2 diabetes mellitus with hyperglycemia, with long-term current use of insulin ([place]) 08/14/2021   HOCM (hypertrophic obstructive cardiomyopathy) ([place]) 05/11/2021   Fatigue 01/28/2021   Dizziness 01/28/2021   Chronic back pain 11/24/2018   Murmur 09/08/2018   Trigger thumb of right hand 10/04/2017   Obesity (BMI 30-39.9) 09/25/2013   Hyperlipidemia 10/19/2011   General medical examination 11/09/2010    BPH (benign prostatic hyperplasia) 02/20/2008   TOBACCO ABUSE 02/20/2008   Diabetes mellitus with coincident hypertension ([place]) 11/21/2007   HOMOCYSTINEMIA 11/21/2007   Essential hypertension 11/21/2007   Past Medical History:  Diagnosis Date   Allergy    Arthritis    BPH (benign prostatic hyperplasia)    Chronic back pain    HTN (hypertension)    states under control with med., has been on med. x 2-3 yr.   Hyperlipidemia, mild    Insulin dependent diabetes mellitus    Trigger thumb of left hand 10/2017   Past Surgical History:  Procedure Laterality Date   COLONOSCOPY  06/19/2006   COLONOSCOPY WITH PROPOFOL  09/28/2016   INGUINAL HERNIA REPAIR     LUMBAR LAMINECTOMY     x 2   POLYPECTOMY     TRIGGER FINGER RELEASE Right 10/04/2017   Procedure: RELEASE TRIGGER THUMB, RIGHT;  Surgeon: Marchia Bond, MD;  Location: [facility];  Service: Orthopedics;  Laterality: Right;   Social History   Tobacco Use   Smoking status: Former    Packs/day: 0.00    Types: Cigarettes    Quit date: 11/16/2010    Years since quitting: 11.8   Smokeless tobacco: Never  Vaping Use   Vaping Use: Never used  Substance Use Topics   Alcohol use: Yes    Comment: occasionally   Drug use: No   Family History  Problem Relation Age  of Onset   Diabetes Father    Breast cancer Mother    Stroke Other 61       F   Colon cancer Neg Hx    Colon polyps Neg Hx    Esophageal cancer Neg Hx    Rectal cancer Neg Hx    Stomach cancer Neg Hx    Not on File    Review of Systems  Eyes:  Negative for blurred vision.  Respiratory:  Negative for shortness of breath.   Cardiovascular:  Negative for chest pain.  Gastrointestinal:  Positive for heartburn. Negative for nausea and vomiting.  Genitourinary:  Negative for frequency.  Endo/Heme/Allergies:  Negative for polydipsia.      Objective:     BP (!) 146/60   Pulse (!) 105   Temp 98.1 F (36.7 C) (Temporal)   Ht 5' 11"$  (1.803 m)    Wt 195 lb (88.5 kg)   SpO2 99%   BMI 27.20 kg/m  BP Readings from Last 3 Encounters:  09/07/22 (!) 146/60  06/29/22 122/68  06/01/22 110/60   Wt Readings from Last 3 Encounters:  09/07/22 195 lb (88.5 kg)  06/29/22 206 lb (93.4 kg)  06/01/22 209 lb 2 oz (94.9 kg)      Physical Exam Vitals and nursing note reviewed.  Cardiovascular:     Rate and Rhythm: Normal rate and regular rhythm.     Pulses: Normal pulses.     Heart sounds: Normal heart sounds.  Pulmonary:     Effort: Pulmonary effort is normal.     Breath sounds: Normal breath sounds.  Neurological:     Mental Status: He is oriented to person, place, and time.      Results for orders placed or performed in visit on 09/07/22  POCT glycosylated hemoglobin (Hb A1C)  Result Value Ref Range   Hemoglobin A1C 6.2 (A) 4.0 - 5.6 %   HbA1c POC (<> result, manual entry)     HbA1c, POC (prediabetic range)     HbA1c, POC (controlled diabetic range)         The 10-year ASCVD risk score (Arnett DK, et al., 2019) is: 40.2%    Assessment & Plan:   Problem List Items Addressed This Visit       Endocrine   Type 2 diabetes mellitus with hyperglycemia, with long-term current use of insulin ([place]) - Primary    Controlled with A1C reading of 6.2 today.  GI symptoms could be secondary to metformin. Will decrease metformin to see if symptoms improve.   Decrease Metformin 1000 mg twice daily to 500 mg twice daily. Continue Glipizide 10 mg twice daily,  Lantus 45 units daily and Ozempic 1 mg weekly.   Long discussion with patient about symptoms of hypoglycemia. Continue checking blood sugars and report consistent readings below 80.  Foot exam done today.  Urine ACR UTD.  Eye exam scheduled for march.  Managed on Statin. UTD on pneumonia vaccine.  Discussed the importance of continue to monitor diet and start exercising.  Follow up in 6 months.       Relevant Medications   metFORMIN (GLUCOPHAGE) 1000 MG tablet   Other  Relevant Orders   POCT glycosylated hemoglobin (Hb A1C) (Completed)   Other Visit Diagnoses     Type 2 diabetes mellitus with complication, with long-term current use of insulin ([facility])       Relevant Medications   metFORMIN (GLUCOPHAGE) 1000 MG tablet   Type 2 diabetes mellitus without complication, without  long-term current use of insulin ([facility])       Relevant Medications   metFORMIN (GLUCOPHAGE) 1000 MG tablet       Return in about 6 months (around 03/08/2023) for diabetes.    Tinnie Gens, BSN-RN, DNP STUDENT

## 2022-09-08 ENCOUNTER — Telehealth: Payer: Self-pay | Admitting: Primary Care

## 2022-09-08 NOTE — Telephone Encounter (Signed)
Contacted Christian Petty to schedule their annual wellness visit. Appointment made for 09/09/2022.  [facility] Direct Dial: [phone]

## 2022-09-09 ENCOUNTER — Ambulatory Visit (INDEPENDENT_AMBULATORY_CARE_PROVIDER_SITE_OTHER): Payer: Medicare Other

## 2022-09-09 VITALS — Ht 71.0 in | Wt 195.0 lb

## 2022-09-09 DIAGNOSIS — Z0000 Encounter for general adult medical examination without abnormal findings: Secondary | ICD-10-CM | POA: Diagnosis not present

## 2022-09-09 NOTE — Patient Instructions (Signed)
Christian Petty , Thank you for taking time to come for your Medicare Wellness Visit. I appreciate your ongoing commitment to your health goals. Please review the following plan we discussed and let me know if I can assist you in the future.   These are the goals we discussed:  Goals      Patient Stated     Keep DM in control.        This is a list of the screening recommended for you and due dates:  Health Maintenance  Topic Date Due   Zoster (Shingles) Vaccine (1 of 2) Never done   Flu Shot  10/17/2022*   Complete foot exam   12/03/2022*   Pneumonia Vaccine (1 of 1 - PCV) 06/05/2023*   Colon Cancer Screening  06/05/2023*   Eye exam for diabetics  09/22/2022   Yearly kidney function blood test for diabetes  12/17/2022   Hemoglobin A1C  03/08/2023   Yearly kidney health urinalysis for diabetes  06/30/2023   DTaP/Tdap/Td vaccine (2 - Td or Tdap) 09/08/2023   Medicare Annual Wellness Visit  09/10/2023   Hepatitis C Screening: USPSTF Recommendation to screen - Ages 18-79 yo.  Completed   HPV Vaccine  Aged Out   COVID-19 Vaccine  Discontinued  *Topic was postponed. The date shown is not the original due date.    Advanced directives: Advance directive discussed with you today. Even though you declined this today, please call our office should you change your mind, and we can give you the proper paperwork for you to fill out.   Conditions/risks identified: Aim for 30 minutes of exercise or brisk walking, 6-8 glasses of water, and 5 servings of fruits and vegetables each day.   Next appointment: Follow up in one year for your annual wellness visit. 09/15/2023 @ 8:45 via telephone.  Preventive Care 7 Years and Older, Male  Preventive care refers to lifestyle choices and visits with your health care provider that can promote health and wellness. What does preventive care include? A yearly physical exam. This is also called an annual well check. Dental exams once or twice a year. Routine  eye exams. Ask your health care provider how often you should have your eyes checked. Personal lifestyle choices, including: Daily care of your teeth and gums. Regular physical activity. Eating a healthy diet. Avoiding tobacco and drug use. Limiting alcohol use. Practicing safe sex. Taking low doses of aspirin every day. Taking vitamin and mineral supplements as recommended by your health care provider. What happens during an annual well check? The services and screenings done by your health care provider during your annual well check will depend on your age, overall health, lifestyle risk factors, and family history of disease. Counseling  Your health care provider may ask you questions about your: Alcohol use. Tobacco use. Drug use. Emotional well-being. Home and relationship well-being. Sexual activity. Eating habits. History of falls. Memory and ability to understand (cognition). Work and work Statistician. Screening  You may have the following tests or measurements: Height, weight, and BMI. Blood pressure. Lipid and cholesterol levels. These may be checked every 5 years, or more frequently if you are over 44 years old. Skin check. Lung cancer screening. You may have this screening every year starting at age 66 if you have a 30-pack-year history of smoking and currently smoke or have quit within the past 15 years. Fecal occult blood test (FOBT) of the stool. You may have this test every year starting at age 38.  Flexible sigmoidoscopy or colonoscopy. You may have a sigmoidoscopy every 5 years or a colonoscopy every 10 years starting at age 26. Prostate cancer screening. Recommendations will vary depending on your family history and other risks. Hepatitis C blood test. Hepatitis B blood test. Sexually transmitted disease (STD) testing. Diabetes screening. This is done by checking your blood sugar (glucose) after you have not eaten for a while (fasting). You may have this done  every 1-3 years. Abdominal aortic aneurysm (AAA) screening. You may need this if you are a current or former smoker. Osteoporosis. You may be screened starting at age 95 if you are at high risk. Talk with your health care provider about your test results, treatment options, and if necessary, the need for more tests. Vaccines  Your health care provider may recommend certain vaccines, such as: Influenza vaccine. This is recommended every year. Tetanus, diphtheria, and acellular pertussis (Tdap, Td) vaccine. You may need a Td booster every 10 years. Zoster vaccine. You may need this after age 28. Pneumococcal 13-valent conjugate (PCV13) vaccine. One dose is recommended after age 49. Pneumococcal polysaccharide (PPSV23) vaccine. One dose is recommended after age 59. Talk to your health care provider about which screenings and vaccines you need and how often you need them. This information is not intended to replace advice given to you by your health care provider. Make sure you discuss any questions you have with your health care provider. Document Released: 08/01/2015 Document Revised: 03/24/2016 Document Reviewed: 05/06/2015 Elsevier Interactive Patient Education  2017 [employer] Prevention in the Home Falls can cause injuries. They can happen to people of all ages. There are many things you can do to make your home safe and to help prevent falls. What can I do on the outside of my home? Regularly fix the edges of walkways and driveways and fix any cracks. Remove anything that might make you trip as you walk through a door, such as a raised step or threshold. Trim any bushes or trees on the path to your home. Use bright outdoor lighting. Clear any walking paths of anything that might make someone trip, such as rocks or tools. Regularly check to see if handrails are loose or broken. Make sure that both sides of any steps have handrails. Any raised decks and porches should have  guardrails on the edges. Have any leaves, snow, or ice cleared regularly. Use sand or salt on walking paths during winter. Clean up any spills in your garage right away. This includes oil or grease spills. What can I do in the bathroom? Use night lights. Install grab bars by the toilet and in the tub and shower. Do not use towel bars as grab bars. Use non-skid mats or decals in the tub or shower. If you need to sit down in the shower, use a plastic, non-slip stool. Keep the floor dry. Clean up any water that spills on the floor as soon as it happens. Remove soap buildup in the tub or shower regularly. Attach bath mats securely with double-sided non-slip rug tape. Do not have throw rugs and other things on the floor that can make you trip. What can I do in the bedroom? Use night lights. Make sure that you have a light by your bed that is easy to reach. Do not use any sheets or blankets that are too big for your bed. They should not hang down onto the floor. Have a firm chair that has side arms. You can use this  for support while you get dressed. Do not have throw rugs and other things on the floor that can make you trip. What can I do in the kitchen? Clean up any spills right away. Avoid walking on wet floors. Keep items that you use a lot in easy-to-reach places. If you need to reach something above you, use a strong step stool that has a grab bar. Keep electrical cords out of the way. Do not use floor polish or wax that makes floors slippery. If you must use wax, use non-skid floor wax. Do not have throw rugs and other things on the floor that can make you trip. What can I do with my stairs? Do not leave any items on the stairs. Make sure that there are handrails on both sides of the stairs and use them. Fix handrails that are broken or loose. Make sure that handrails are as long as the stairways. Check any carpeting to make sure that it is firmly attached to the stairs. Fix any carpet  that is loose or worn. Avoid having throw rugs at the top or bottom of the stairs. If you do have throw rugs, attach them to the floor with carpet tape. Make sure that you have a light switch at the top of the stairs and the bottom of the stairs. If you do not have them, ask someone to add them for you. What else can I do to help prevent falls? Wear shoes that: Do not have high heels. Have rubber bottoms. Are comfortable and fit you well. Are closed at the toe. Do not wear sandals. If you use a stepladder: Make sure that it is fully opened. Do not climb a closed stepladder. Make sure that both sides of the stepladder are locked into place. Ask someone to hold it for you, if possible. Clearly mark and make sure that you can see: Any grab bars or handrails. First and last steps. Where the edge of each step is. Use tools that help you move around (mobility aids) if they are needed. These include: Canes. Walkers. Scooters. Crutches. Turn on the lights when you go into a dark area. Replace any light bulbs as soon as they burn out. Set up your furniture so you have a clear path. Avoid moving your furniture around. If any of your floors are uneven, fix them. If there are any pets around you, be aware of where they are. Review your medicines with your doctor. Some medicines can make you feel dizzy. This can increase your chance of falling. Ask your doctor what other things that you can do to help prevent falls. This information is not intended to replace advice given to you by your health care provider. Make sure you discuss any questions you have with your health care provider. Document Released: 05/01/2009 Document Revised: 12/11/2015 Document Reviewed: 08/09/2014 Elsevier Interactive Patient Education  2017 [employer].

## 2022-09-09 NOTE — Progress Notes (Signed)
I connected with  Christian Petty on 09/09/22 by a audio enabled telemedicine application and verified that I am speaking with the correct person using two identifiers.  Patient Location: Home  Provider Location: Home Office  I discussed the limitations of evaluation and management by telemedicine. The patient expressed understanding and agreed to proceed.  Subjective:   Christian Petty is a 67 y.o. male who presents for an Initial Medicare Annual Wellness Visit.  Review of Systems     Cardiac Risk Factors include: advanced age (>3mn, >>64women);hypertension;male gender;diabetes mellitus;dyslipidemia     Objective:    Today's Vitals   09/09/22 1028  Weight: 195 lb (88.5 kg)  Height: 5' 11"$  (1.803 m)   Body mass index is 27.2 kg/m.     09/09/2022   10:36 AM 10/28/2017   12:01 PM 09/28/2017    4:43 PM 01/18/2017    9:18 AM 09/20/2016    2:38 PM  Advanced Directives  Does Patient Have a Medical Advance Directive? No No No Yes Yes  Type of Advance Directive     [place]?   No - copy requested    Would patient like information on creating a medical advance directive? No - Patient declined No - Patient declined Yes (MAU/Ambulatory/Procedural Areas - Information given)      Current Medications (verified) Outpatient Encounter Medications as of 09/09/2022  Medication Sig   atorvastatin (LIPITOR) 40 MG tablet TAKE 1 TABLET BY MOUTH EVERY DAY FOR CHOLESTEROL   CONTOUR NEXT TEST test strip Use as instructed   fenofibrate 160 MG tablet TAKE 1 TABLET(160 MG) BY MOUTH DAILY FOR CHOLESTEROL   glipiZIDE (GLUCOTROL) 10 MG tablet TAKE 1 TABLET BY MOUTH TWICE DAILY BEFORE A MEAL FOR DIABETES   insulin glargine (LANTUS SOLOSTAR) 100 UNIT/ML Solostar Pen ADMINISTER 45 UNITS UNDER THE SKIN AT BEDTIME FOR DIABETES   Insulin Pen Needle (CAREFINE PEN NEEDLES) 31G X 8 MM MISC Use with insulin once nightly at bedtime.   Lancets MISC Use as  instructed to test blood sugar daily   losartan (COZAAR) 100 MG tablet TAKE 1 TABLET BY MOUTH EVERY DAY FOR BLOOD PRESSURE   metFORMIN (GLUCOPHAGE) 1000 MG tablet Take 0.5 tablets (500 mg total) by mouth 2 (two) times daily with a meal. for diabetes.   metoprolol succinate (TOPROL-XL) 50 MG 24 hr tablet Take 1 tablet (50 mg total) by mouth daily. Please contact the office to schedule appointment for additional refills. 1st Attempt.   Semaglutide, 1 MG/DOSE, 4 MG/3ML SOPN Inject 1 mg as directed once a week. for diabetes.   tamsulosin (FLOMAX) 0.4 MG CAPS capsule TAKE ONE CAPSULE BY MOUTH ONCE AFTER BREAKFAST FOR URINE FLOW   guaiFENesin-codeine (ROBITUSSIN AC) 100-10 MG/5ML syrup Take 5-10 mLs by mouth at bedtime as needed for cough. (Patient not taking: Reported on 09/09/2022)   No facility-administered encounter medications on file as of 09/09/2022.    Allergies (verified) Patient has no allergy information on record.   History: Past Medical History:  Diagnosis Date   Allergy    Arthritis    BPH (benign prostatic hyperplasia)    Chronic back pain    HTN (hypertension)    states under control with med., has been on med. x 2-3 yr.   Hyperlipidemia, mild    Insulin dependent diabetes mellitus    Trigger thumb of left hand 10/2017   Past Surgical History:  Procedure Laterality Date   COLONOSCOPY  06/19/2006   COLONOSCOPY WITH PROPOFOL  09/28/2016   INGUINAL HERNIA REPAIR     LUMBAR LAMINECTOMY     x 2   POLYPECTOMY     TRIGGER FINGER RELEASE Right 10/04/2017   Procedure: RELEASE TRIGGER THUMB, RIGHT;  Surgeon: Marchia Bond, MD;  Location: [facility];  Service: Orthopedics;  Laterality: Right;   Family History  Problem Relation Age of Onset   Diabetes Father    Breast cancer Mother    Stroke Other 76       F   Colon cancer Neg Hx    Colon polyps Neg Hx    Esophageal cancer Neg Hx    Rectal cancer Neg Hx    Stomach cancer Neg Hx    Social History    Socioeconomic History   Marital status: Married    Spouse name: Not on file   Number of children: 2   Years of education: Not on file   Highest education level: Not on file  Occupational History   Occupation: truck driver   Tobacco Use   Smoking status: Former    Packs/day: 0.00    Types: Cigarettes    Quit date: 11/16/2010    Years since quitting: 11.8   Smokeless tobacco: Never  Vaping Use   Vaping Use: Never used  Substance and Sexual Activity   Alcohol use: Yes    Comment: occasionally   Drug use: No   Sexual activity: Not on file  Other Topics Concern   Not on file  Social History Narrative   Married.   2 children, 2 grandchildren.   Works for himself as a Administrator.   Social Determinants of Health   Financial Resource Strain: Low Risk  (09/09/2022)   Overall Financial Resource Strain (CARDIA)    Difficulty of Paying Living Expenses: Not hard at all  Recent Concern: Financial Resource Strain - Medium Risk (08/19/2022)   Overall Financial Resource Strain (CARDIA)    Difficulty of Paying Living Expenses: Somewhat hard  Food Insecurity: No Food Insecurity (09/09/2022)   Hunger Vital Sign    Worried About Running Out of Food in the Last Year: Never true    Ran Out of Food in the Last Year: Never true  Transportation Needs: No Transportation Needs (09/09/2022)   PRAPARE - Hydrologist (Medical): No    Lack of Transportation (Non-Medical): No  Physical Activity: Inactive (09/09/2022)   Exercise Vital Sign    Days of Exercise per Week: 0 days    Minutes of Exercise per Session: 0 min  Stress: No Stress Concern Present (09/09/2022)   [place]    Feeling of Stress : Not at all  Social Connections: Unknown (09/09/2022)   Social Connection and Isolation Panel [NHANES]    Frequency of Communication with Friends and Family: More than three times a week    Frequency of Social  Gatherings with Friends and Family: Once a week    Attends Religious Services: Not on Advertising copywriter or Organizations: Not on file    Attends Archivist Meetings: Not on file    Marital Status: Married    Tobacco Counseling Counseling given: Not Answered   Clinical Intake:  Pre-visit preparation completed: Yes  Pain : No/denies pain     Nutritional Risks: None Diabetes: Yes CBG done?: No Did pt. bring in CBG monitor from home?: No  How  often do you need to have someone help you when you read instructions, pamphlets, or other written materials from your doctor or pharmacy?: 1 - Never  Diabetic?Nutrition Risk Assessment:  Has the patient had any N/V/D within the last 2 months?  No  Does the patient have any non-healing wounds?  No  Has the patient had any unintentional weight loss or weight gain?  No   Diabetes:  Is the patient diabetic?  Yes  If diabetic, was a CBG obtained today?  No  Did the patient bring in their glucometer from home?  No  How often do you monitor your CBG's? Once weekly.   Financial Strains and Diabetes Management:  Are you having any financial strains with the device, your supplies or your medication? No .  Does the patient want to be seen by Chronic Care Management for management of their diabetes?  No  Would the patient like to be referred to a Nutritionist or for Diabetic Management?  No   Diabetic Exams:  Diabetic Eye Exam: Completed 09/21/2021 [facility]. Diabetic Foot Exam: Completed 12/09/20 PCP    Interpreter Needed?: No  Information entered by :: C.Elysse Polidore LPN   Activities of Daily Living    09/09/2022   10:36 AM  In your present state of health, do you have any difficulty performing the following activities:  Hearing? 0  Vision? 0  Difficulty concentrating or making decisions? 0  Walking or climbing stairs? 0  Dressing or bathing? 0  Doing errands, shopping? 0  Preparing Food and eating ? N   Using the Toilet? N  In the past six months, have you accidently leaked urine? N  Do you have problems with loss of bowel control? N  Managing your Medications? N  Managing your Finances? N  Housekeeping or managing your Housekeeping? N    Patient Care Team: Pleas Koch, NP as PCP - General (Internal Medicine) Jerline Pain, MD as PCP - Cardiology (Cardiology) Charlton Haws, [facility] as Pharmacist (Pharmacist)  Indicate any recent Medical Services you may have received from other than [facility] providers in the past year (date may be approximate).     Assessment:   This is a routine wellness examination for Joni.  Hearing/Vision screen Hearing Screening - Comments:: No aids Vision Screening - Comments:: Glasses - [facility]  Dietary issues and exercise activities discussed: Current Exercise Habits: The patient does not participate in regular exercise at present, Exercise limited by: None identified   Goals Addressed             This Visit's Progress    Patient Stated       Keep DM in control.       Depression Screen    09/09/2022   10:35 AM 09/07/2022   12:23 PM 12/16/2021   12:54 PM 12/09/2020    9:03 AM 01/18/2017    9:19 AM 01/12/2017   11:46 AM  PHQ 2/9 Scores  PHQ - 2 Score 0 0 0 0 0 0  PHQ- 9 Score   2 3      Fall Risk    09/09/2022   10:30 AM 09/07/2022   12:23 PM 12/16/2021   12:55 PM 12/09/2020    9:03 AM 01/18/2017    9:19 AM  Fall Risk   Falls in the past year? 0 0 0 0 No  Number falls in past yr: 0 0 0 0   Injury with Fall? 0 0 0 0   Risk for  fall due to : No Fall Risks No Fall Risks     Follow up Falls prevention discussed;Falls evaluation completed Falls evaluation completed       FALL RISK PREVENTION PERTAINING TO THE HOME:  Any stairs in or around the home? No  If so, are there any without handrails? Yes  Home free of loose throw rugs in walkways, pet beds, electrical cords, etc? Yes  Adequate lighting in your home to  reduce risk of falls? Yes   ASSISTIVE DEVICES UTILIZED TO PREVENT FALLS:  Life alert? No  Use of a cane, walker or w/c? No  Grab bars in the bathroom? No  Shower chair or bench in shower? No  Elevated toilet seat or a handicapped toilet? No    Cognitive Function:        09/09/2022   10:37 AM  6CIT Screen  What Year? 0 points  What month? 0 points  What time? 0 points  Count back from 20 0 points  Months in reverse 0 points  Repeat phrase 4 points  Total Score 4 points    Immunizations Immunization History  Administered Date(s) Administered   Influenza Whole 05/13/2010   PFIZER(Purple Top)SARS-COV-2 Vaccination 09/09/2019, 10/02/2019, 07/24/2020   Pneumococcal-Unspecified 10/18/2014   Tdap 09/07/2013    TDAP status: Up to date  Flu Vaccine status: Declined, Education has been provided regarding the importance of this vaccine but patient still declined. Advised may receive this vaccine at local pharmacy or Health Dept. Aware to provide a copy of the vaccination record if obtained from local pharmacy or Health Dept. Verbalized acceptance and understanding.  Pneumococcal vaccine status: Due, Education has been provided regarding the importance of this vaccine. Advised may receive this vaccine at local pharmacy or Health Dept. Aware to provide a copy of the vaccination record if obtained from local pharmacy or Health Dept. Verbalized acceptance and understanding.  Covid-19 vaccine status: Declined, Education has been provided regarding the importance of this vaccine but patient still declined. Advised may receive this vaccine at local pharmacy or Health Dept.or vaccine clinic. Aware to provide a copy of the vaccination record if obtained from local pharmacy or Health Dept. Verbalized acceptance and understanding.  Qualifies for Shingles Vaccine? Yes   Zostavax completed No   Shingrix Completed?: No.    Education has been provided regarding the importance of this vaccine.  Patient has been advised to call insurance company to determine out of pocket expense if they have not yet received this vaccine. Advised may also receive vaccine at local pharmacy or Health Dept. Verbalized acceptance and understanding.  Screening Tests Health Maintenance  Topic Date Due   Zoster Vaccines- Shingrix (1 of 2) Never done   INFLUENZA VACCINE  10/17/2022 (Originally 02/16/2022)   FOOT EXAM  12/03/2022 (Originally 12/09/2021)   Pneumonia Vaccine 12+ Years old (1 of 1 - PCV) 06/05/2023 (Originally 12/29/2019)   COLONOSCOPY (Pts 45-19yr Insurance coverage will need to be confirmed)  06/05/2023 (Originally 10/05/2019)   OPHTHALMOLOGY EXAM  09/22/2022   Diabetic kidney evaluation - eGFR measurement  12/17/2022   HEMOGLOBIN A1C  03/08/2023   Diabetic kidney evaluation - Urine ACR  06/30/2023   DTaP/Tdap/Td (2 - Td or Tdap) 09/08/2023   Medicare Annual Wellness (AWV)  09/10/2023   Hepatitis C Screening  Completed   HPV VACCINES  Aged Out   COVID-19 Vaccine  Discontinued    Health Maintenance  Health Maintenance Due  Topic Date Due   Zoster Vaccines- Shingrix (1 of 2) Never  done    Colorectal cancer screening: Type of screening: Colonoscopy. Completed 10/04/2016. Repeat every 3 years declined  Lung Cancer Screening: (Low Dose CT Chest recommended if Age 81-80 years, 30 pack-year currently smoking OR have quit w/in 15years.) does qualify.   Lung Cancer Screening Referral: declined  Additional Screening:  Hepatitis C Screening: does not qualify; Completed 01/28/2021  Vision Screening: Recommended annual ophthalmology exams for early detection of glaucoma and other disorders of the eye. Is the patient up to date with their annual eye exam?  Yes  Who is the provider or what is the name of the office in which the patient attends annual eye exams? [facility] If pt is not established with a provider, would they like to be referred to a provider to establish care? No .    Dental Screening: Recommended annual dental exams for proper oral hygiene  Community Resource Referral / Chronic Care Management: CRR required this visit?  No   CCM required this visit?  No      Plan:     I have personally reviewed and noted the following in the patient's chart:   Medical and social history Use of alcohol, tobacco or illicit drugs  Current medications and supplements including opioid prescriptions. Patient is not currently taking opioid prescriptions. Functional ability and status Nutritional status Physical activity Advanced directives List of other physicians Hospitalizations, surgeries, and ER visits in previous 12 months Vitals Screenings to include cognitive, depression, and falls Referrals and appointments  In addition, I have reviewed and discussed with patient certain preventive protocols, quality metrics, and best practice recommendations. A written personalized care plan for preventive services as well as general preventive health recommendations were provided to patient.     Lebron Conners, LPN   [id number]   Nurse Notes: Pt declines colonoscopy.  Vaccinations: declines all Influenza vaccine: recommend every Fall Pneumococcal vaccine: recommend once per lifetime Prevnar-20 Tdap vaccine: recommend every 10 years Shingles vaccine: recommend Shingrix which is 2 doses 2-6 months apart and over 90% effective     Covid-19: recommend 2 doses one month apart with a booster 6 months later

## 2022-09-10 ENCOUNTER — Ambulatory Visit: Payer: Medicare Other | Admitting: Primary Care

## 2022-09-23 DIAGNOSIS — E119 Type 2 diabetes mellitus without complications: Secondary | ICD-10-CM | POA: Diagnosis not present

## 2022-09-23 DIAGNOSIS — H52202 Unspecified astigmatism, left eye: Secondary | ICD-10-CM | POA: Diagnosis not present

## 2022-09-23 DIAGNOSIS — H25813 Combined forms of age-related cataract, bilateral: Secondary | ICD-10-CM | POA: Diagnosis not present

## 2022-09-23 LAB — HM DIABETES EYE EXAM

## 2022-10-01 ENCOUNTER — Ambulatory Visit (INDEPENDENT_AMBULATORY_CARE_PROVIDER_SITE_OTHER): Payer: Medicare Other | Admitting: Nurse Practitioner

## 2022-10-01 ENCOUNTER — Encounter: Payer: Self-pay | Admitting: Nurse Practitioner

## 2022-10-01 VITALS — BP 122/60 | HR 98 | Temp 98.7°F | Resp 16 | Ht 71.0 in | Wt 194.2 lb

## 2022-10-01 DIAGNOSIS — I10 Essential (primary) hypertension: Secondary | ICD-10-CM | POA: Diagnosis not present

## 2022-10-01 DIAGNOSIS — E119 Type 2 diabetes mellitus without complications: Secondary | ICD-10-CM

## 2022-10-01 NOTE — Patient Instructions (Signed)
Nice to see you today Follow up with Anda Kraft as scheduled or as needed

## 2022-10-01 NOTE — Progress Notes (Signed)
   Acute Office Visit  Subjective:     Patient ID: Christian Petty, male    DOB: October 27, 1955, 67 y.o.   MRN: [id number]  Chief Complaint  Patient presents with   Diabetes     Patient is in today for Diabetes form    States that he checks his sugars 1-2 times a week. States that they are 140-200s.  No hypoglycemia.  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  Respiratory:  Negative for shortness of breath.   Cardiovascular:  Negative for chest pain.  Neurological:  Negative for tingling.        Objective:    BP 122/60   Pulse 98   Temp 98.7 F (37.1 C)   Resp 16   Ht 5\' 11"  (1.803 m)   Wt 194 lb 4 oz (88.1 kg)   SpO2 99%   BMI 27.09 kg/m  BP Readings from Last 3 Encounters:  10/01/22 122/60  09/07/22 (!) 146/60  06/29/22 122/68   Wt Readings from Last 3 Encounters:  10/01/22 194 lb 4 oz (88.1 kg)  09/09/22 195 lb (88.5 kg)  09/07/22 195 lb (88.5 kg)      Physical Exam Vitals and nursing note reviewed.  Constitutional:      Appearance: Normal appearance.  Cardiovascular:     Rate and Rhythm: Normal rate and regular rhythm.     Heart sounds: Normal heart sounds.  Pulmonary:     Effort: Pulmonary effort is normal.     Breath sounds: Normal breath sounds.  Neurological:     Mental Status: He is alert.     Diabetic Foot Form - Detailed   Diabetic Foot Exam - detailed Is there swelling or and abnormal foot shape?: No Is there a claw toe deformity?: No Is there elevated skin temparature?: No Pulse Foot Exam completed.: Yes   Right posterior Tibialias: Diminished Left posterior Tibialias: Present   Right Dorsalis Pedis: Present Left Dorsalis Pedis: Present  Sensory Foot Exam Completed.: Yes Semmes-Weinstein Monofilament Test   Comments: All 10 sites bilateral sensation intact      No results  found for any visits on 10/01/22.      Assessment & Plan:   Problem List Items Addressed This Visit       Cardiovascular and Mediastinum   Diabetes mellitus with coincident hypertension ([place]) - Primary    Patient's last A1c under goal at 6.2%.  He does check his glucose 2-4 times weekly.  No hypoglycemia as of late last office visit his metformin was reduced per his primary care provider.  Did fill out DOT form and get back to patient.  The print off his A1c's and attached.  Patient's blood pressure is under good control.  Did see where he was evaluated by cardiology for hocm but does not have follow-up as he is doing well on beta-blocker       No orders of the defined types were placed in this encounter.   Return if symptoms worsen or fail to improve.  Romilda Garret, NP

## 2022-10-01 NOTE — Assessment & Plan Note (Signed)
Patient's last A1c under goal at 6.2%.  He does check his glucose 2-4 times weekly.  No hypoglycemia as of late last office visit his metformin was reduced per his primary care provider.  Did fill out DOT form and get back to patient.  The print off his A1c's and attached.  Patient's blood pressure is under good control.  Did see where he was evaluated by cardiology for hocm but does not have follow-up as he is doing well on beta-blocker

## 2022-10-05 IMAGING — CR DG CHEST 2V
1 series · 2 of 2 positions shown · non-contrast
Comparison: Chest radiograph dated 09/25/2013

CLINICAL DATA: 65-year-old male with history of tobacco use and
weight loss.

EXAM:
CHEST - 2 VIEW

[Series 1: dg chest 2 view · 0.14mm/px · 2 of 2 slices shown]
[im 1/2]
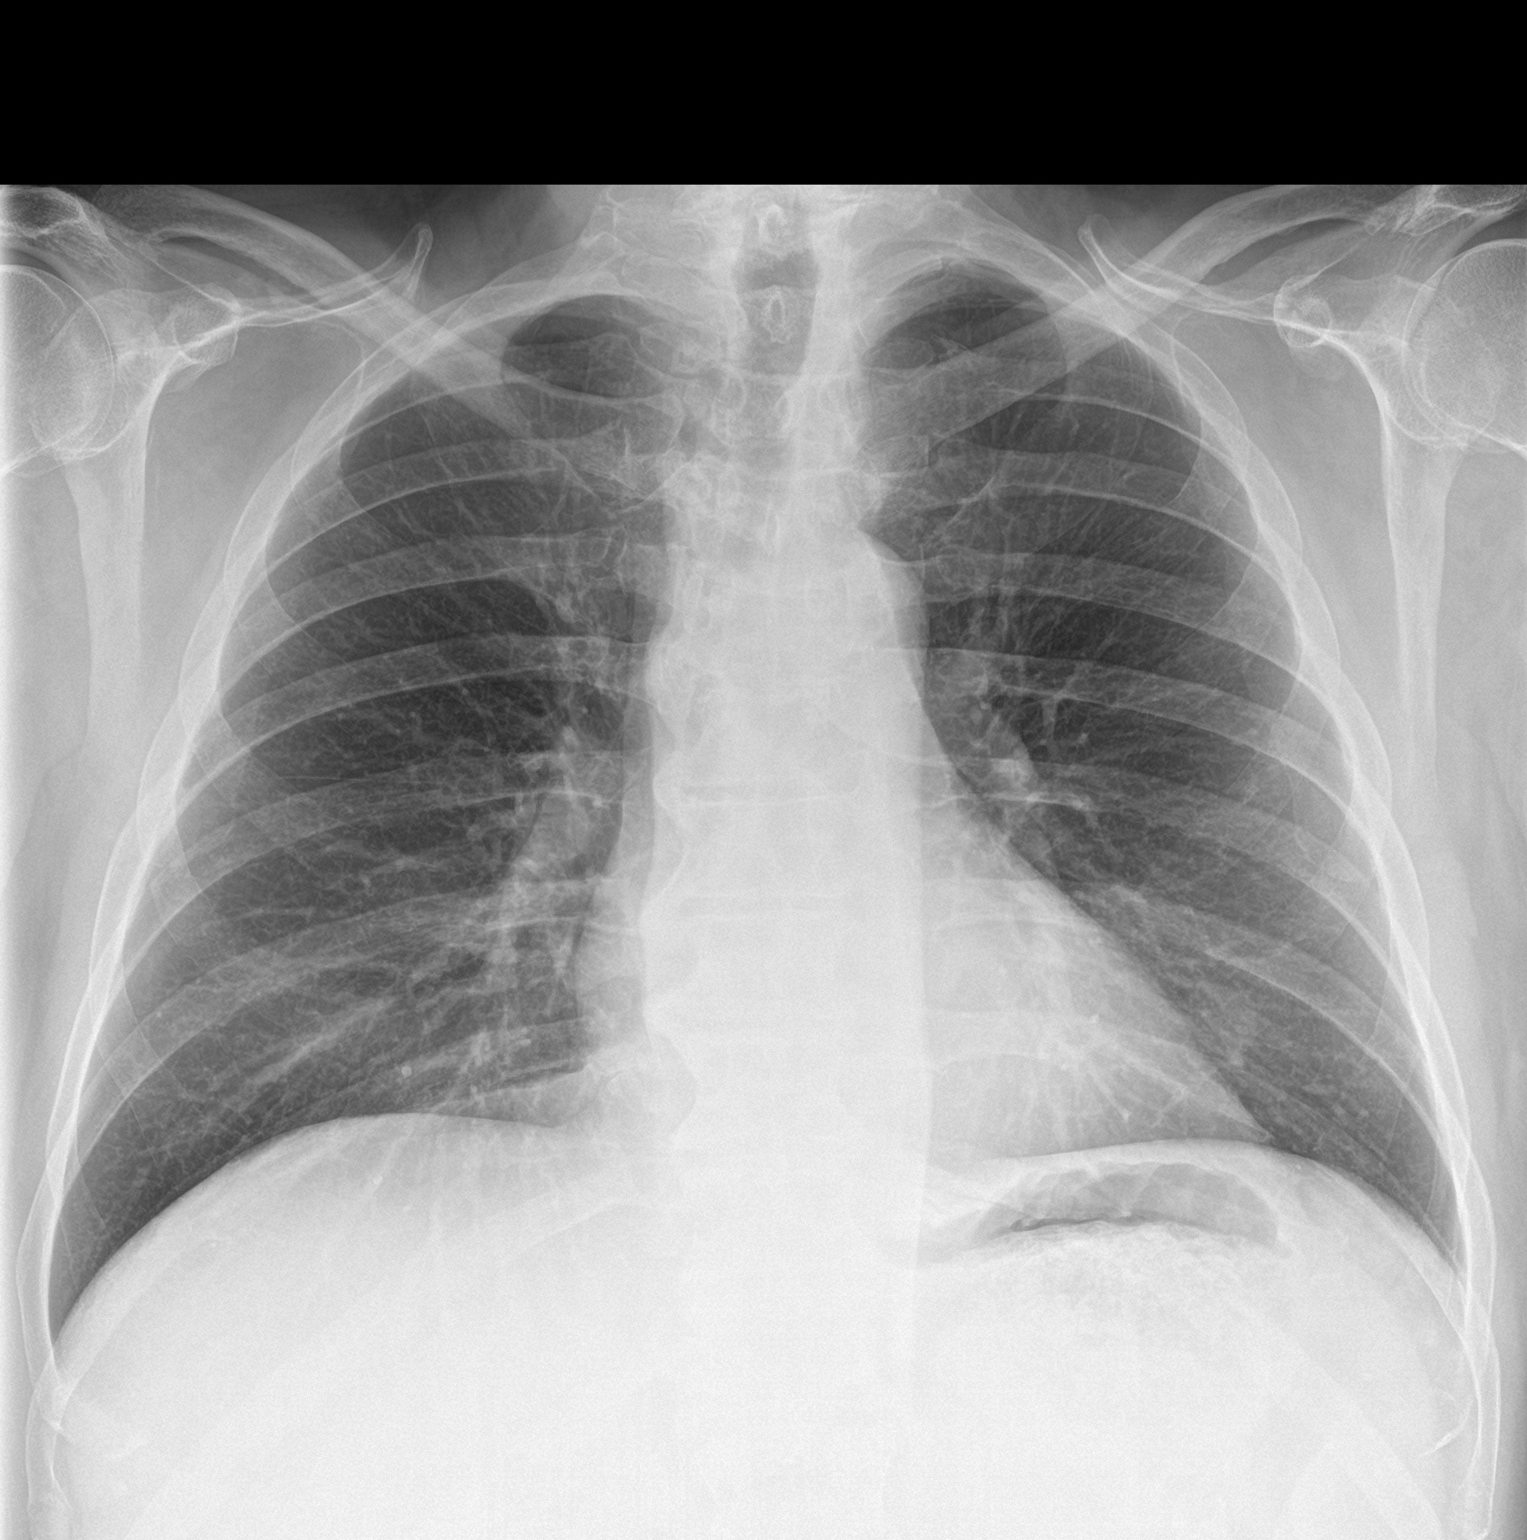
[im 2/2]
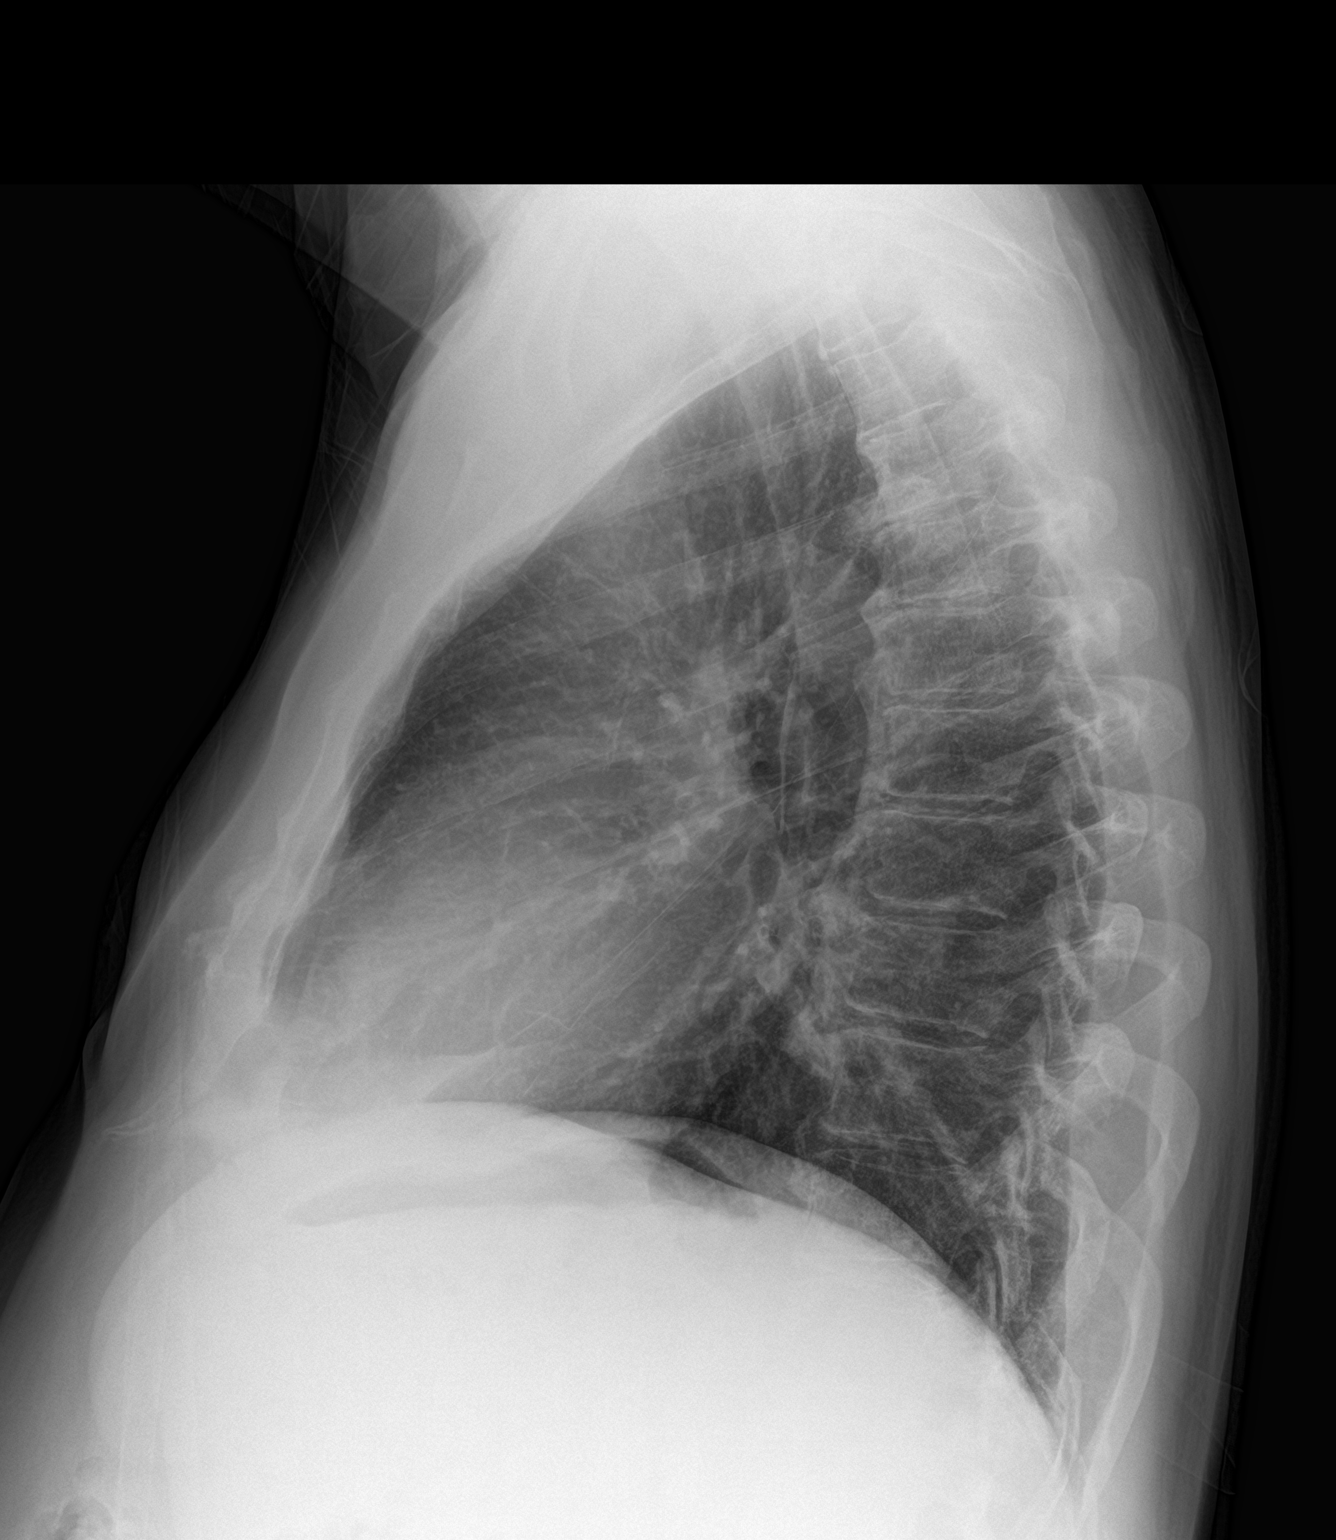

[2 of 2 positions shown; findings below may reference images not displayed]

FINDINGS: No focal consolidation, pleural effusion, or pneumothorax. The
cardiac silhouette is within limits. No acute osseous pathology.
Degenerative changes of the spine.
IMPRESSION: No active cardiopulmonary disease.

## 2022-10-18 ENCOUNTER — Other Ambulatory Visit: Payer: Self-pay | Admitting: Primary Care

## 2022-10-18 DIAGNOSIS — E782 Mixed hyperlipidemia: Secondary | ICD-10-CM

## 2022-10-18 NOTE — Telephone Encounter (Signed)
Patient is due for diabetes follow up in late August. Please schedule, thank you!

## 2022-10-19 NOTE — Telephone Encounter (Signed)
Patient stated that he will call back and schedule later.

## 2022-11-11 ENCOUNTER — Telehealth: Payer: Self-pay

## 2022-11-11 NOTE — Progress Notes (Signed)
Care Management & Coordination Services Pharmacy Team  Reason for Encounter: Appointment Reminder  Contacted patient to confirm telephone appointment with Al Corpus, PharmD on 11/17/2022 at 10:00.  Spoke with patient on 11/11/2022   Do you have any problems getting your medications? No  What is your top health concern you would like to discuss at your upcoming visit? Patient stated no concerns right now  Have you seen any other providers since your last visit with PCP? Yes  Star Rating Drugs:  Medication:  Last Fill: Day Supply Atorvastatin 40 mg 10/18/2022 90 Glipizide 10 mg 08/16/2022 90 Losartan 100 mg 10/18/2022 90 Metformin 1000 mg 08/05/2022 90 -Needs new Rx Ozempic 1 mg  08/29/2022 28 Verified with [facility]  Care Gaps: Annual wellness visit in last year? Yes 09/09/2022  If Diabetic: Last eye exam / retinopathy screening: Up to date Last diabetic foot exam: Up to date  Al Corpus, PharmD notified  Claudina Lick, [place] Clinical Pharmacy Assistant [phone]

## 2022-11-17 ENCOUNTER — Ambulatory Visit: Payer: Medicare Other | Admitting: Pharmacist

## 2022-11-17 NOTE — Patient Instructions (Signed)
Visit Information  Phone number for Pharmacist: [phone]  Thank you for meeting with me to discuss your medications! Below is a summary of what we talked about during the visit:   Recommendations/Changes made from today's visit: -No med changes; encouraged to follow up with cardiology  Follow up plan: -Pharmacist follow up televisit scheduled for 6 months -PCP appt due 02/2023 (53-month)   Al Corpus, PharmD, BCACP Clinical Pharmacist [facility] [phone]

## 2022-11-17 NOTE — Progress Notes (Signed)
Care Management & Coordination Services Pharmacy Note  11/17/2022 Name:  Christian Petty MRN:  [id number] DOB:  07-Nov-1955  Summary: F/U visit -DM: A1c 6.2 (08/2022) at goal with improved adherence to medications; he reports ~15 lb wt loss on Ozempic and improvement in glucose (he checks ~once a week and does not recall numbers); he declines CGM -HTN/HOCM: pt stopped taking metoprolol ~Nov 2023 when prescription ran out; this was initially prescribed by cardiology for HOCM, pt is overdue for follow up with cardiology and has been advised to make appt, and has declined to do so  Recommendations/Changes made from today's visit: -No med changes; encouraged to follow up with cardiology  Follow up plan: -Pharmacist follow up televisit scheduled for 6 months -PCP appt due 02/2023 (63-month)    Subjective: Christian Petty is an 67 y.o. year old male who is a primary patient of Doreene Nest, NP.  The care coordination team was consulted for assistance with disease management and care coordination needs.    Engaged with patient face to face for initial visit. 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 He is on the road for 4-5 days at a time, he does bring all medications with him but often forgets doses due to irregular schedule.   Recent office visits: 10/01/22 NP Audria Nine OV: DM - no changes.  09/07/22 NP Mayra Reel OV: f/u - A1c 6.2%; decrease metformin to 500 mg BID (gas/bloating). Discussed sx hypoglycemia.  RTC 6 months.  06/29/22 NP Mayra Reel OV: annual - pt has not been taking PM doses of metformin and glipizide. Began Ozempic 3-4 weeks ago. Check BG twice daily. Increase Ozempic to 1 mg. Referred for colonoscopy. F/u Feb 2024.  06/01/22 Dr Ermalene Searing OV: urinary frequency - Rx Myrbetriq.  03/24/22 Dr Ermalene Searing OV: acute cough - rx zpak, benzonatate, mucinex-DM.  Recent consult visits: 07/22/21 Dr Anne Fu VV (Cardiology): HOCM - ZIO monitor reassuring.  Rare PVC. Continue Toprol.  Hospital visits: None in previous 6 months   Objective:  Lab Results  Component Value Date   CREATININE 1.36 12/16/2021   BUN 25 (H) 12/16/2021   GFR 54.05 (L) 12/16/2021   EGFR 58 (L) 05/11/2021   GFRNONAA >60 11/02/2017   GFRAA >60 11/02/2017   NA 135 12/16/2021   K 4.4 12/16/2021   CALCIUM 10.5 12/16/2021   CO2 27 12/16/2021   GLUCOSE 187 (H) 12/16/2021    Lab Results  Component Value Date/Time   HGBA1C 6.2 (A) 09/07/2022 12:34 PM   HGBA1C 9.6 (A) 06/01/2022 11:02 AM   HGBA1C 8.6 (H) 10/03/2017 08:28 AM   HGBA1C 9.2 (H) 03/31/2017 08:18 AM   GFR 54.05 (L) 12/16/2021 01:33 PM   GFR 53.45 (L) 01/28/2021 12:18 PM   MICROALBUR 2.5 (H) 06/29/2022 12:36 PM   MICROALBUR 2.9 (H) 07/29/2016 09:03 AM    Last diabetic Eye exam:  Lab Results  Component Value Date/Time   HMDIABEYEEXA No Retinopathy 09/23/2022 03:18 PM    Last diabetic Foot exam:  Lab Results  Component Value Date/Time   HMDIABFOOTEX yes 05/03/2010 12:00 AM     Lab Results  Component Value Date   CHOL 140 12/16/2021   HDL 26.50 (L) 12/16/2021   LDLCALC 70 09/09/2020   LDLDIRECT 89.0 12/16/2021   TRIG 236.0 (H) 12/16/2021   CHOLHDL 5 12/16/2021       Latest Ref Rng & Units 12/16/2021    1:33 PM 09/09/2020   11:07 AM 06/07/2019  12:01 PM  Hepatic Function  Total Protein 6.0 - 8.3 g/dL 7.3  6.7  6.8   Albumin 3.5 - 5.2 g/dL 4.4  4.2  4.4   AST 0 - 37 U/L 14  13  13    ALT 0 - 53 U/L 16  17  24    Alk Phosphatase 39 - 117 U/L 27  33  31   Total Bilirubin 0.2 - 1.2 mg/dL 0.5  0.8  0.5     Lab Results  Component Value Date/Time   TSH 1.47 01/28/2021 12:18 PM   TSH 1.10 10/20/2011 10:54 AM       Latest Ref Rng & Units 12/16/2021    1:33 PM 05/11/2021    1:37 PM 01/28/2021   12:18 PM  CBC  WBC 4.0 - 10.5 K/uL 8.3  7.5  8.2   Hemoglobin 13.0 - 17.0 g/dL 16.1  09.6  04.5   Hematocrit 39.0 - 52.0 % 40.2  41.2  33.7   Platelets 150.0 - 400.0 K/uL 339.0  381  385.0      No results found for: "VD25OH", "VITAMINB12"  Clinical ASCVD: No  The 10-year ASCVD risk score (Arnett DK, et al., 2019) is: 31.1%   Values used to calculate the score:     Age: 67 years     Sex: Male     Is Non-Hispanic African American: No     Diabetic: Yes     Tobacco smoker: No     Systolic Blood Pressure: 122 mmHg     Is BP treated: Yes     HDL Cholesterol: 26.5 mg/dL     Total Cholesterol: 140 mg/dL        10/25/8117   14:78 AM 09/07/2022   12:23 PM 12/16/2021   12:54 PM  Depression screen PHQ 2/9  Decreased Interest 0 0 0  Down, Depressed, Hopeless 0 0 0  PHQ - 2 Score 0 0 0  Altered sleeping   1  Tired, decreased energy   1  Change in appetite   0  Feeling bad or failure about yourself    0  Trouble concentrating   0  Moving slowly or fidgety/restless   0  Suicidal thoughts   0  PHQ-9 Score   2  Difficult doing work/chores   Not difficult at all     Social History   Tobacco Use  Smoking Status Former   Packs/day: 0   Types: Cigarettes   Quit date: 11/16/2010   Years since quitting: 12.0  Smokeless Tobacco Never   BP Readings from Last 3 Encounters:  10/01/22 122/60  09/07/22 (!) 146/60  06/29/22 122/68   Pulse Readings from Last 3 Encounters:  10/01/22 98  09/07/22 (!) 105  06/29/22 (!) 102   Wt Readings from Last 3 Encounters:  10/01/22 194 lb 4 oz (88.1 kg)  09/09/22 195 lb (88.5 kg)  09/07/22 195 lb (88.5 kg)   BMI Readings from Last 3 Encounters:  10/01/22 27.09 kg/m  09/09/22 27.20 kg/m  09/07/22 27.20 kg/m    Not on File  Medications Reviewed Today     Reviewed by Kathyrn Sheriff, RPH (Pharmacist) on 11/17/22 at 1018  Med List Status: <None>   Medication Order Taking? Sig Documenting Provider Last Dose Status Informant  atorvastatin (LIPITOR) 40 MG tablet [id number] Yes TAKE 1 TABLET BY MOUTH EVERY DAY FOR CHOLESTEROL Doreene Nest, NP Taking Active   CONTOUR NEXT TEST test strip [id number] Yes Use as instructed  Chestine Spore,  Keane Scrape, NP Taking Active   fenofibrate 160 MG tablet [id number] Yes TAKE 1 TABLET(160 MG) BY MOUTH DAILY FOR CHOLESTEROL Doreene Nest, NP Taking Active   glipiZIDE (GLUCOTROL) 10 MG tablet [id number] Yes TAKE 1 TABLET BY MOUTH TWICE DAILY BEFORE A MEAL FOR DIABETES Doreene Nest, NP Taking Active   guaiFENesin-codeine [facility]) 100-10 MG/5ML syrup [id number] Yes Take 5-10 mLs by mouth at bedtime as needed for cough. Excell Seltzer, MD Taking Active   insulin glargine (LANTUS SOLOSTAR) 100 UNIT/ML Solostar Pen [id number] Yes ADMINISTER 45 UNITS UNDER THE SKIN AT BEDTIME FOR DIABETES Doreene Nest, NP Taking Active   Insulin Pen Needle (CAREFINE PEN NEEDLES) 31G X 8 MM MISC [id number] Yes Use with insulin once nightly at bedtime. Doreene Nest, NP Taking Active   Lancets MISC [id number] Yes Use as instructed to test blood sugar daily Doreene Nest, NP Taking Active   losartan (COZAAR) 100 MG tablet [id number] Yes TAKE 1 TABLET BY MOUTH EVERY DAY FOR BLOOD PRESSURE Doreene Nest, NP Taking Active   metFORMIN (GLUCOPHAGE) 1000 MG tablet [id number] Yes Take 0.5 tablets (500 mg total) by mouth 2 (two) times daily with a meal. for diabetes. Doreene Nest, NP Taking Active   Semaglutide, 1 MG/DOSE, 4 MG/3ML Namon Cirri [id number] Yes Inject 1 mg as directed once a week. for diabetes. Doreene Nest, NP Taking Active   tamsulosin (FLOMAX) 0.4 MG CAPS capsule [id number] Yes TAKE ONE CAPSULE BY MOUTH ONCE AFTER BREAKFAST FOR URINE FLOW Doreene Nest, NP Taking Active             SDOH:  (Social Determinants of Health) assessments and interventions performed: Yes SDOH Interventions    Flowsheet Row Clinical Support from 09/09/2022 in [facility] Coordination from 08/19/2022 in [facility] Office Visit from 12/16/2021 in [facility] Visit from 12/09/2020 in [facility]  [place] [facility] [place]  SDOH Interventions      Food Insecurity Interventions Intervention Not Indicated -- -- --  Housing Interventions Intervention Not Indicated Intervention Not Indicated -- --  Transportation Interventions Intervention Not Indicated Intervention Not Indicated -- --  Utilities Interventions Intervention Not Indicated -- -- --  Depression Interventions/Treatment  -- -- Counseling Counseling  Financial Strain Interventions Intervention Not Indicated Other (Comment)  [pursue Ozempic PAP] -- --  Physical Activity Interventions Patient Refused, Other (Comments) -- -- --  Stress Interventions Intervention Not Indicated -- -- --  Social Connections Interventions Intervention Not Indicated, Patient Refused -- -- --       Medication Assistance:  Ozempic - Novo Cares PAP approved 2024  Medication Access: Within the past 30 days, how often has patient missed a dose of medication? 0 Is a pillbox or other method used to improve adherence? No  Factors that may affect medication adherence? lack of understanding of disease management and irregular schedule (truck driver) Are meds synced by current pharmacy? No  Are meds delivered by current pharmacy? No  Does patient experience delays in picking up medications due to transportation concerns? No   [facility] Services Reviewed: Is patient disadvantaged to use [facility]?: No  Current Rx insurance plan: [facility] Name and location of Current pharmacy:  [facility] [phone] - [place], [place] - [address] AT [facility] [address] [place] [address] Phone: [phone] Fax: [phone]  [facility] services reviewed  with patient today?: No  Patient requests to transfer care to [facility] Pharm[facility]?: No  Reason patient declined to change pharmacies: Not mentioned at this visit  Compliance/Adherence/Medication fill history: Care Gaps: AWV  Star-Rating  Drugs: Atorvastatin - PDC 100% Glipizide - PDC 87% Losartan - PDC 100% Metformin - PDC 87% Ozempic - PAP   Assessment/Plan  Hypertension (BP goal <130/80) -Controlled - per clinic BP -Hx HOCM, followed with cardiology (Dr Anne Fu); Pt is no longer taking metoprolol - Rx expired ~05/2022 and pt did not renew it; previously explain role of metoprolol for HOCM and importance of follow up with cardiology, pt has declined to make appt with cardiology -Pt is not checking BP at home -Current treatment: Losartan 100 mg daily - Appropriate, Effective, Safe, Accessible -Medications previously tried: metoprolol -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 -Current exercise habits: limited -Educated on BP goals and benefits of medications for prevention of heart attack, stroke and kidney damage; -Counseled to monitor BP at home periodically -Recommended to continue current medication; again encouraged f/u with cardiology  Hyperlipidemia: (LDL goal < 70) -Not ideally controlled - LDL 89 (11/2021) above goal, LDL has been at goal on same regimen in the past; TRIG 236 improved from peak of 438 -ASCVD risk 31% -Current treatment: Atorvastatin 40 mg daily - Appropriate, Query Effective Fenofibrate 160 mg daily - Appropriate, Effective, Safe, Accessible -Medications previously tried: n/a  -Educated on Cholesterol goals; Benefits of statin for ASCVD risk reduction; Importance of limiting foods high in cholesterol; -Recommended to continue current medication; check lipid panel at next OV  Diabetes (A1c goal <7%) -Controlled - A1c 6.2% (08/2022), pt has restarted Ozempic and reports 16lbs wt loss and improvement in glucose readings; he checks BG about once a week when he thinks about it, he does not have any numbers available during call -Denies hypoglycemic/hyperglycemic symptoms -Current medications: Metformin 500 mg BID - Appropriate, Effective, Safe, Accessible Lantus 46  units HS -Appropriate, Effective, Safe, Accessible Ozempic 1 mg weekly (PAP) -Appropriate, Effective, Safe, Accessible Glipizide 10 mg BID -Appropriate, Effective, Query Safe -Medications previously tried: n/a  -Educated on A1c and blood sugar goals; Complications of diabetes including kidney damage, retinal damage, and cardiovascular disease; Benefits of weight loss; -Reviewed diet - 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

## 2022-12-03 ENCOUNTER — Telehealth: Payer: Self-pay

## 2022-12-03 NOTE — Telephone Encounter (Signed)
Received patient assistance medication- 4 boxes of Ozempic  Located in middle fridge.  Patient has been notified.

## 2022-12-24 ENCOUNTER — Telehealth: Payer: Self-pay | Admitting: Primary Care

## 2022-12-24 NOTE — Telephone Encounter (Signed)
Noted and agree with nurses recommendations.

## 2022-12-24 NOTE — Telephone Encounter (Signed)
Do not see encounter from Access nurse yet.

## 2022-12-24 NOTE — Telephone Encounter (Signed)
Patient was advised by access nurse to be seen in the next 4 hours,he declined. Have him scheduled to see Jae Dire for 12/28/2022.

## 2022-12-24 NOTE — Telephone Encounter (Signed)
FYI: This call has been transferred to Access Nurse. Once the result note has been entered staff can address the message at that time.  Patient called in with the following symptoms:  Red Word:dizziness upon standing, BP and blood sugar okay    Please advise at Mobile [phone] (mobile)  Message is routed to Provider Pool and [facility] Triage

## 2022-12-24 NOTE — Telephone Encounter (Signed)
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 Christian Petty is in [facility] now.When Christian Petty is walking he gets dizzier. Christian Petty said he does not get dizzy sitting. No H/A,CP or SOB. I advised Christian Petty he should not be driving and he said sitting does not cause dizziness. I explained that could change and Christian Petty promised if started with dizziness while driving would pull over and call 911. If Christian Petty condition worsens he said he would go to UC wherever he is at. Christian Petty said already has appt with [facility] on 12/28/22 at 3:20. Sending note to Allayne Gitelman NP.l

## 2022-12-28 ENCOUNTER — Encounter: Payer: Self-pay | Admitting: Primary Care

## 2022-12-28 ENCOUNTER — Ambulatory Visit (INDEPENDENT_AMBULATORY_CARE_PROVIDER_SITE_OTHER): Payer: Medicare Other | Admitting: Primary Care

## 2022-12-28 VITALS — BP 136/68 | HR 88 | Temp 98.1°F | Ht 71.0 in | Wt 191.0 lb

## 2022-12-28 DIAGNOSIS — E1165 Type 2 diabetes mellitus with hyperglycemia: Secondary | ICD-10-CM | POA: Diagnosis not present

## 2022-12-28 DIAGNOSIS — R42 Dizziness and giddiness: Secondary | ICD-10-CM | POA: Diagnosis not present

## 2022-12-28 DIAGNOSIS — Z794 Long term (current) use of insulin: Secondary | ICD-10-CM

## 2022-12-28 LAB — POCT GLYCOSYLATED HEMOGLOBIN (HGB A1C): Hemoglobin A1C: 7.7 % — AB (ref 4.0–5.6)

## 2022-12-28 MED ORDER — METFORMIN HCL ER 500 MG PO TB24: 1000.0000 mg | ORAL_TABLET | Freq: Every day | ORAL | 1 refills | Status: DC

## 2022-12-28 MED ORDER — GLIPIZIDE ER 5 MG PO TB24: 5.0000 mg | ORAL_TABLET | Freq: Every day | ORAL | 1 refills | Status: DC

## 2022-12-28 NOTE — Assessment & Plan Note (Signed)
Overall controlled but increased compared to last visit with A1c today of 7.7.  Continue Ozempic 1 mg weekly for now, consider dose reduction to 0.5 mg weekly but he is reticent to do so now.  Change metformin as he is missing his evening dose numerous times weekly. Start metformin XR 500 mg, 2 tablets every morning.  Change glipizide dose as he is missing his evening dose numerous times weekly and could be experiencing some side effects. Start glipizide XL 5 mg daily.  I have strongly advised that he start checking his glucose levels. Continue Lantus 45 units daily.  If we see him back in a few weeks we will conduct a glucose test. Otherwise, follow-up in 6 months pending that he checks his glucose levels at home.

## 2022-12-28 NOTE — Progress Notes (Signed)
Subjective:    Patient ID: Christian Petty, male    DOB: [birth year], 67 y.o.   MRN: [id number]  Dizziness Pertinent negatives include no chest pain, headaches, nausea, numbness, vomiting or weakness.    Christian Petty is a very pleasant 67 y.o. male with a history of type 2 diabetes, hypertension, BPH, hypertrophic obstructive cardiomyopathy, chronic back pain, dizziness who presents today to discuss dizziness.   His wife joins [place] today.  Chronic symptoms of dizziness that has historically occurred with position changes such as sitting to standing. Symptoms are intermittent and infrequent overall. Over the last 10 days he's noticed increased frequency of dizziness that occur only with changing positions and continues while he is walking. He has to lean against walls or hold onto objects to prevent falling.  His dizziness occurs sporadically, not all the time. 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 He denies room spinning sensations, dizziness when sitting, chest pain, diaphoresis, nausea, unilateral weakness, slurred speech, headaches. Several evenings ago he developed dizziness while laying in bed, his wife thinks this was vertigo.   He suspects his dizziness occurs because he doesn't get enough sleep at times. He's checking glucose levels infrequently. He stopped his Lantus insulin two days ago. He hasn't been taking the second dose of his glipizide 10 mg, metformin 1000 mg several days weekly  He follows with cardiology, Dr. Anne Fu. Last office visit was in January 2023. He has yet to follow up this year. He has lost a lot of weight since management on Ozempic.  He is not able to eat as much as he previously did.  BP Readings from Last 3 Encounters:  12/28/22 136/68  10/01/22 122/60  09/07/22 (!) 146/60    Wt Readings from Last 3 Encounters:  12/28/22 191 lb (86.6 kg)  10/01/22 194 lb 4 oz (88.1 kg)  09/09/22 195 lb (88.5 kg)     Review of  Systems  Eyes:  Negative for visual disturbance.  Respiratory:  Negative for shortness of breath.   Cardiovascular:  Negative for chest pain.  Gastrointestinal:  Negative for nausea and vomiting.  Neurological:  Positive for dizziness. Negative for syncope, speech difficulty, weakness, numbness and headaches.         Past Medical History:  Diagnosis Date   Allergy    Arthritis    BPH (benign prostatic hyperplasia)    Chronic back pain    HTN (hypertension)    states under control with med., has been on med. x 2-3 yr.   Hyperlipidemia, mild    Insulin dependent diabetes mellitus    Trigger thumb of left hand 10/2017    Social History   Socioeconomic History   Marital status: Married    Spouse name: Not on file   Number of children: 2   Years of education: Not on file   Highest education level: Not on file  Occupational History   Occupation: truck driver   Tobacco Use   Smoking status: Former    Packs/day: 0    Types: Cigarettes    Quit date: 11/16/2010    Years since quitting: 12.1   Smokeless tobacco: Never  Vaping Use   Vaping Use: Never used  Substance and Sexual Activity   Alcohol use: Yes    Comment: occasionally   Drug use: No   Sexual activity: Not on file  Other Topics Concern   Not on file  Social History Narrative   Married.  2 children, 2 grandchildren.   Works for himself as a Naval architect.   Social Determinants of Health   Financial Resource Strain: Low Risk  (09/09/2022)   Overall Financial Resource Strain (CARDIA)    Difficulty of Paying Living Expenses: Not hard at all  Recent Concern: Financial Resource Strain - Medium Risk (08/19/2022)   Overall Financial Resource Strain (CARDIA)    Difficulty of Paying Living Expenses: Somewhat hard  Food Insecurity: No Food Insecurity (09/09/2022)   Hunger Vital Sign    Worried About Running Out of Food in the Last Year: Never true    Ran Out of Food in the Last Year: Never true  Transportation Needs:  No Transportation Needs (09/09/2022)   PRAPARE - Administrator, Civil Service (Medical): No    Lack of Transportation (Non-Medical): No  Physical Activity: Inactive (09/09/2022)   Exercise Vital Sign    Days of Exercise per Week: 0 days    Minutes of Exercise per Session: 0 min  Stress: No Stress Concern Present (09/09/2022)   [name] of Occupational Health - Occupational Stress Questionnaire    Feeling of Stress : Not at all  Social Connections: Unknown (09/09/2022)   Social Connection and Isolation Panel [NHANES]    Frequency of Communication with Friends and Family: More than three times a week    Frequency of Social Gatherings with Friends and Family: Once a week    Attends Religious Services: Not on Insurance claims handler of Clubs or Organizations: Not on file    Attends Banker Meetings: Not on file    Marital Status: Married  Intimate Partner Violence: Not At Risk (09/09/2022)   Humiliation, Afraid, Rape, and Kick questionnaire    Fear of Current or Ex-Partner: No    Emotionally Abused: No    Physically Abused: No    Sexually Abused: No    Past Surgical History:  Procedure Laterality Date   COLONOSCOPY  06/19/2006   COLONOSCOPY WITH PROPOFOL  09/28/2016   INGUINAL HERNIA REPAIR     LUMBAR LAMINECTOMY     x 2   POLYPECTOMY     TRIGGER FINGER RELEASE Right 10/04/2017   Procedure: RELEASE TRIGGER THUMB, RIGHT;  Surgeon: Teryl Lucy, MD;  Location: [facility];  Service: Orthopedics;  Laterality: Right;    Family History  Problem Relation Age of Onset   Diabetes Father    Breast cancer Mother    Stroke Other 96       F   Colon cancer Neg Hx    Colon polyps Neg Hx    Esophageal cancer Neg Hx    Rectal cancer Neg Hx    Stomach cancer Neg Hx     Not on File  Current Outpatient Medications on File Prior to Visit  Medication Sig Dispense Refill   atorvastatin (LIPITOR) 40 MG tablet TAKE 1 TABLET BY MOUTH EVERY DAY FOR  CHOLESTEROL 90 tablet 1   CONTOUR NEXT TEST test strip Use as instructed 100 strip 2   fenofibrate 160 MG tablet TAKE 1 TABLET(160 MG) BY MOUTH DAILY FOR CHOLESTEROL 90 tablet 2   guaiFENesin-codeine (ROBITUSSIN AC) 100-10 MG/5ML syrup Take 5-10 mLs by mouth at bedtime as needed for cough. 180 mL 0   insulin glargine (LANTUS SOLOSTAR) 100 UNIT/ML Solostar Pen ADMINISTER 45 UNITS UNDER THE SKIN AT BEDTIME FOR DIABETES 45 mL 1   Insulin Pen Needle (CAREFINE PEN NEEDLES) 31G X 8 MM MISC Use with  insulin once nightly at bedtime. 100 each 3   Lancets MISC Use as instructed to test blood sugar daily 100 each 0   losartan (COZAAR) 100 MG tablet TAKE 1 TABLET BY MOUTH EVERY DAY FOR BLOOD PRESSURE 90 tablet 3   Semaglutide, 1 MG/DOSE, 4 MG/3ML SOPN Inject 1 mg as directed once a week. for diabetes. 9 mL 0   tamsulosin (FLOMAX) 0.4 MG CAPS capsule TAKE ONE CAPSULE BY MOUTH ONCE AFTER BREAKFAST FOR URINE FLOW 90 capsule 3   No current facility-administered medications on file prior to visit.    BP 136/68   Pulse 88   Temp 98.1 F (36.7 C) (Temporal)   Ht 5\' 11"  (1.803 m)   Wt 191 lb (86.6 kg)   SpO2 98%   BMI 26.64 kg/m  Objective:   Physical Exam Eyes:     Extraocular Movements: Extraocular movements intact.  Cardiovascular:     Rate and Rhythm: Normal rate and regular rhythm.  Pulmonary:     Effort: Pulmonary effort is normal.     Breath sounds: Normal breath sounds. No wheezing or rales.  Musculoskeletal:     Cervical back: Neck supple.  Skin:    General: Skin is warm and dry.  Neurological:     Mental Status: He is alert and oriented to person, place, and time.     Cranial Nerves: No cranial nerve deficit.     Coordination: Coordination normal.           Assessment & Plan:  Dizziness Assessment & Plan: Orthostatic during visit today, especially when comparing his supine position to his standing position. Discussed this with patient and his wife.  Other differentials  could be medication side effects, dehydration. Neuro exam unremarkable.  His symptoms only occur with movement.  Reduce losartan to 50 mg daily.  He has a blood pressure cuff at home and will start monitoring.  We will also change around his diabetes medications, see notes under type 2 diabetes.  I have also asked for him to schedule a follow-up visit with his cardiologist, he agrees and will do so. We will plan to see him back in the office in 2 to 3 weeks for blood pressure check and symptom check if he cannot get in with his cardiologist by that time.  CBC and BMP pending.  Orders: -     Basic metabolic panel -     CBC  Type 2 diabetes mellitus with hyperglycemia, with long-term current use of insulin (HCC) Assessment & Plan: Overall controlled but increased compared to last visit with A1c today of 7.7.  Continue Ozempic 1 mg weekly for now, consider dose reduction to 0.5 mg weekly but he is reticent to do so now.  Change metformin as he is missing his evening dose numerous times weekly. Start metformin XR 500 mg, 2 tablets every morning.  Change glipizide dose as he is missing his evening dose numerous times weekly and could be experiencing some side effects. Start glipizide XL 5 mg daily.  I have strongly advised that he start checking his glucose levels. Continue Lantus 45 units daily.  If we see him back in a few weeks we will conduct a glucose test. Otherwise, follow-up in 6 months pending that he checks his glucose levels at home.  Orders: -     POCT glycosylated hemoglobin (Hb A1C) -     metFORMIN HCl ER; Take 2 tablets (1,000 mg total) by mouth daily with breakfast. for diabetes.  Dispense:  180 tablet; Refill: 1 -     glipiZIDE ER; Take 1 tablet (5 mg total) by mouth daily with breakfast. for diabetes.  Dispense: 90 tablet; Refill: 1        Doreene Nest, NP

## 2022-12-28 NOTE — Assessment & Plan Note (Signed)
Orthostatic during visit today, especially when comparing his supine position to his standing position. Discussed this with patient and his wife.  Other differentials could be medication side effects, dehydration. Neuro exam unremarkable.  His symptoms only occur with movement.  Reduce losartan to 50 mg daily.  He has a blood pressure cuff at home and will start monitoring.  We will also change around his diabetes medications, see notes under type 2 diabetes.  I have also asked for him to schedule a follow-up visit with his cardiologist, he agrees and will do so. We will plan to see him back in the office in 2 to 3 weeks for blood pressure check and symptom check if he cannot get in with his cardiologist by that time.  CBC and BMP pending.

## 2022-12-28 NOTE — Patient Instructions (Addendum)
I have changed your diabetes medication around for easier management.  Stop taking glipizide 10 mg twice daily. Start taking glipizide XL 5 mg once daily in the morning.  Stop taking metformin 1000 mg twice daily. Start taking metformin XR 500 mg, 2 tablets every morning.  Start checking your blood sugars as discussed.  Cut your blood pressure pill (losartan) in half. Take 50 mg daily.   Please schedule a follow-up visit with Dr. Anne Fu. [phone]  Stop by the lab prior to leaving today. I will notify you of your results once received.   Please schedule a physical to meet with me in 6 months.   It was a pleasure to see you today!

## 2022-12-29 LAB — BASIC METABOLIC PANEL
BUN: 30 mg/dL — ABNORMAL HIGH (ref 6–23)
CO2: 24 mEq/L (ref 19–32)
Calcium: 9.8 mg/dL (ref 8.4–10.5)
Chloride: 99 mEq/L (ref 96–112)
Creatinine, Ser: 1.42 mg/dL (ref 0.40–1.50)
GFR: 50.95 mL/min — ABNORMAL LOW (ref >60.00)
Glucose, Bld: 230 mg/dL — ABNORMAL HIGH (ref 70–99)
Potassium: 4.4 mEq/L (ref 3.5–5.1)
Sodium: 133 mEq/L — ABNORMAL LOW (ref 135–145)

## 2022-12-29 LAB — CBC
HCT: 40 % (ref 39.0–52.0)
Hemoglobin: 13.4 g/dL (ref 13.0–17.0)
MCHC: 33.4 g/dL (ref 30.0–36.0)
MCV: 82 fl (ref 78.0–100.0)
Platelets: 315 10*3/uL (ref 150.0–400.0)
RBC: 4.87 Mil/uL (ref 4.22–5.81)
RDW: 14 % (ref 11.5–15.5)
WBC: 8.5 10*3/uL (ref 4.0–10.5)

## 2022-12-31 ENCOUNTER — Other Ambulatory Visit: Payer: Self-pay | Admitting: Primary Care

## 2022-12-31 ENCOUNTER — Ambulatory Visit (HOSPITAL_BASED_OUTPATIENT_CLINIC_OR_DEPARTMENT_OTHER): Payer: Medicare Other | Admitting: Family

## 2022-12-31 DIAGNOSIS — N1831 Chronic kidney disease, stage 3a: Secondary | ICD-10-CM

## 2023-01-19 ENCOUNTER — Ambulatory Visit (HOSPITAL_BASED_OUTPATIENT_CLINIC_OR_DEPARTMENT_OTHER): Payer: Medicare Other | Admitting: Cardiology

## 2023-03-08 ENCOUNTER — Telehealth: Payer: Self-pay

## 2023-03-08 NOTE — Telephone Encounter (Signed)
Patient assistance Ozempic received in office. Called patient to let know that we have 4 pen for pick up in our office. Medication placed in refrigerator.

## 2023-03-25 ENCOUNTER — Encounter: Payer: Self-pay | Admitting: Primary Care

## 2023-03-25 ENCOUNTER — Ambulatory Visit (INDEPENDENT_AMBULATORY_CARE_PROVIDER_SITE_OTHER): Payer: Medicare Other | Admitting: Primary Care

## 2023-03-25 VITALS — BP 138/60 | HR 110 | Temp 97.2°F | Ht 71.0 in | Wt 189.0 lb

## 2023-03-25 DIAGNOSIS — R079 Chest pain, unspecified: Secondary | ICD-10-CM | POA: Insufficient documentation

## 2023-03-25 DIAGNOSIS — E1165 Type 2 diabetes mellitus with hyperglycemia: Secondary | ICD-10-CM | POA: Diagnosis not present

## 2023-03-25 DIAGNOSIS — Z794 Long term (current) use of insulin: Secondary | ICD-10-CM | POA: Diagnosis not present

## 2023-03-25 MED ORDER — FAMOTIDINE 20 MG PO TABS
20.0000 mg | ORAL_TABLET | Freq: Every day | ORAL | 0 refills | Status: DC
Start: 2023-03-25 — End: 2023-06-14

## 2023-03-25 NOTE — Assessment & Plan Note (Addendum)
Differentials include acute coronary syndrome, GERD, pneumonia.  EKG today with sinus tachycardia with rate of 106.  No acute ST changes.  Reviewed ECG today with patient's cardiologist, Dr. Anne Fu who reports that ECG appears similar to prior and there is no acute process today.  Symptoms today likely secondary to side effect from Ozempic, discussed with patient and his wife today.  Stop Ozempic Start famotidine 20 mg daily.  CBC and CMP pending.  Close follow-up in 2 weeks.

## 2023-03-25 NOTE — Assessment & Plan Note (Signed)
Discontinue Ozempic given side effects.  Follow-up in 2 weeks. Consider Mounjaro.

## 2023-03-25 NOTE — Progress Notes (Signed)
Subjective:    Patient ID: Christian Petty, male    DOB: March 08, 1956, 67 y.o.   MRN: [id number]  Chest Pain  Pertinent negatives include no abdominal pain, cough, diaphoresis or shortness of breath.    Christian Petty is a very pleasant 67 y.o. male with a history of type 2 diabetes, hypertension, hypertrophic obstructive cardiomyopathy, BPH, tobacco use, hyperlipidemia, fatigue, dizziness who presents today to discuss chest pain.   His chest pain is located to the mid and left chest which began 7 days ago while moving around in his truck. He described his pain as "pressure". Since then, his chest pressure as been intermittent, occurs mostly with exertion such as going to the bathroom, getting in and out of the shower.   He's experienced chronic esophageal burning and mid chest pressure with the sensation to belch for the last several months. He has difficulty belching most of the time. When he does belch, his pain is relieved.  He began Ozempic in June 2024.  His pain dose not radiate. He denies nausea, diaphoresis, cough, congestion.  He missed his recent appointment with his cardiologist as he was out of town.  He has not rescheduled.   BP Readings from Last 3 Encounters:  03/25/23 138/60  12/28/22 136/68  10/01/22 122/60     Review of Systems  Constitutional:  Positive for fatigue. Negative for diaphoresis.  HENT:  Negative for congestion.   Respiratory:  Negative for cough and shortness of breath.   Cardiovascular:  Positive for chest pain.  Gastrointestinal:  Negative for abdominal pain.       Esophageal burning, belching         Past Medical History:  Diagnosis Date   Allergy    Arthritis    BPH (benign prostatic hyperplasia)    Chronic back pain    HTN (hypertension)    states under control with med., has been on med. x 2-3 yr.   Hyperlipidemia, mild    Insulin dependent diabetes mellitus    Trigger thumb of left hand 10/2017    Social History   Socioeconomic History    Marital status: Married    Spouse name: Not on file   Number of children: 2   Years of education: Not on file   Highest education level: Not on file  Occupational History   Occupation: truck driver   Tobacco Use   Smoking status: Former    Current packs/day: 0.00    Types: Cigarettes    Quit date: 11/16/2010    Years since quitting: 12.3   Smokeless tobacco: Never  Vaping Use   Vaping status: Never Used  Substance and Sexual Activity   Alcohol use: Yes    Comment: occasionally   Drug use: No   Sexual activity: Not on file  Other Topics Concern   Not on file  Social History Narrative   Married.   2 children, 2 grandchildren.   Works for himself as a Naval architect.   Social Determinants of Health   Financial Resource Strain: Low Risk  (09/09/2022)   Overall Financial Resource Strain (CARDIA)    Difficulty of Paying Living Expenses: Not hard at all  Recent Concern: Financial Resource Strain - Medium Risk (08/19/2022)   Overall Financial Resource Strain (CARDIA)    Difficulty of Paying Living Expenses: Somewhat hard  Food Insecurity: No Food Insecurity (09/09/2022)   Hunger Vital Sign    Worried About Running Out of Food in the Last Year: Never true  Ran Out of Food in the Last Year: Never true  Transportation Needs: No Transportation Needs (09/09/2022)   PRAPARE - Administrator, Civil Service (Medical): No    Lack of Transportation (Non-Medical): No  Physical Activity: Inactive (09/09/2022)   Exercise Vital Sign    Days of Exercise per Week: 0 days    Minutes of Exercise per Session: 0 min  Stress: No Stress Concern Present (09/09/2022)   [name] of Occupational Health - Occupational Stress Questionnaire    Feeling of Stress : Not at all  Social Connections: Unknown (09/09/2022)   Social Connection and Isolation Panel [NHANES]    Frequency of Communication with Friends and Family: More than three times a week    Frequency of Social Gatherings with  Friends and Family: Once a week    Attends Religious Services: Not on Insurance claims handler of Clubs or Organizations: Not on file    Attends Banker Meetings: Not on file    Marital Status: Married  Intimate Partner Violence: Not At Risk (09/09/2022)   Humiliation, Afraid, Rape, and Kick questionnaire    Fear of Current or Ex-Partner: No    Emotionally Abused: No    Physically Abused: No    Sexually Abused: No    Past Surgical History:  Procedure Laterality Date   COLONOSCOPY  06/19/2006   COLONOSCOPY WITH PROPOFOL  09/28/2016   INGUINAL HERNIA REPAIR     LUMBAR LAMINECTOMY     x 2   POLYPECTOMY     TRIGGER FINGER RELEASE Right 10/04/2017   Procedure: RELEASE TRIGGER THUMB, RIGHT;  Surgeon: Teryl Lucy, MD;  Location: [facility];  Service: Orthopedics;  Laterality: Right;    Family History  Problem Relation Age of Onset   Diabetes Father    Breast cancer Mother    Stroke Other 17       F   Colon cancer Neg Hx    Colon polyps Neg Hx    Esophageal cancer Neg Hx    Rectal cancer Neg Hx    Stomach cancer Neg Hx     Not on File  Current Outpatient Medications on File Prior to Visit  Medication Sig Dispense Refill   atorvastatin (LIPITOR) 40 MG tablet TAKE 1 TABLET BY MOUTH EVERY DAY FOR CHOLESTEROL 90 tablet 1   CONTOUR NEXT TEST test strip Use as instructed 100 strip 2   fenofibrate 160 MG tablet TAKE 1 TABLET(160 MG) BY MOUTH DAILY FOR CHOLESTEROL 90 tablet 2   glipiZIDE (GLUCOTROL XL) 5 MG 24 hr tablet Take 1 tablet (5 mg total) by mouth daily with breakfast. for diabetes. 90 tablet 1   insulin glargine (LANTUS SOLOSTAR) 100 UNIT/ML Solostar Pen ADMINISTER 45 UNITS UNDER THE SKIN AT BEDTIME FOR DIABETES 45 mL 1   Insulin Pen Needle (CAREFINE PEN NEEDLES) 31G X 8 MM MISC Use with insulin once nightly at bedtime. 100 each 3   Lancets MISC Use as instructed to test blood sugar daily 100 each 0   losartan (COZAAR) 100 MG tablet TAKE 1 TABLET BY  MOUTH EVERY DAY FOR BLOOD PRESSURE 90 tablet 3   metFORMIN (GLUCOPHAGE-XR) 500 MG 24 hr tablet Take 2 tablets (1,000 mg total) by mouth daily with breakfast. for diabetes. 180 tablet 1   Semaglutide, 1 MG/DOSE, 4 MG/3ML SOPN Inject 1 mg as directed once a week. for diabetes. 9 mL 0   tamsulosin (FLOMAX) 0.4 MG CAPS capsule TAKE ONE CAPSULE  BY MOUTH ONCE AFTER BREAKFAST FOR URINE FLOW 90 capsule 3   guaiFENesin-codeine (ROBITUSSIN AC) 100-10 MG/5ML syrup Take 5-10 mLs by mouth at bedtime as needed for cough. (Patient not taking: Reported on 03/25/2023) 180 mL 0   No current facility-administered medications on file prior to visit.    BP 138/60   Pulse (!) 110   Temp (!) 97.2 F (36.2 C) (Temporal)   Ht 5\' 11"  (1.803 m)   Wt 189 lb (85.7 kg)   SpO2 97%   BMI 26.36 kg/m  Objective:   Physical Exam Cardiovascular:     Rate and Rhythm: Normal rate and regular rhythm.  Pulmonary:     Effort: Pulmonary effort is normal.     Breath sounds: Normal breath sounds. No wheezing or rales.  Musculoskeletal:     Cervical back: Neck supple.  Skin:    General: Skin is warm and dry.  Neurological:     Mental Status: He is alert and oriented to person, place, and time.           Assessment & Plan:  Chest pain, unspecified type Assessment & Plan: Differentials include acute coronary syndrome, GERD, pneumonia.  EKG today with sinus tachycardia with rate of 106.  No acute ST changes.  Reviewed ECG today with patient's cardiologist, Dr. Anne Fu who reports that ECG appears similar to prior and there is no acute process today.  Symptoms today likely secondary to side effect from Ozempic, discussed with patient and his wife today.  Stop Ozempic Start famotidine 20 mg daily.  CBC and CMP pending.  Close follow-up in 2 weeks.  Orders: -     EKG 12-Lead -     Famotidine; Take 1 tablet (20 mg total) by mouth daily. for heartburn.  Dispense: 90 tablet; Refill: 0 -     CBC with  Differential/Platelet -     Comprehensive metabolic panel  Type 2 diabetes mellitus with hyperglycemia, with long-term current use of insulin (HCC) Assessment & Plan: Discontinue Ozempic given side effects.  Follow-up in 2 weeks. Consider Mounjaro.         Doreene Nest, NP

## 2023-03-25 NOTE — Patient Instructions (Addendum)
Stop Ozempic for diabetes.  Start famotidine (Pepcid) 20 mg once daily for heartburn symptoms.  Stop by the lab prior to leaving today. I will notify you of your results once received.   Schedule a follow-up visit for 2 weeks.  It was a pleasure to see you today!

## 2023-03-26 LAB — CBC WITH DIFFERENTIAL/PLATELET
Absolute Monocytes: 433 {cells}/uL (ref 200–950)
Basophils Absolute: 61 {cells}/uL (ref 0–200)
Basophils Relative: 0.8 %
Eosinophils Absolute: 258 {cells}/uL (ref 15–500)
Eosinophils Relative: 3.4 %
HCT: 39.3 % (ref 38.5–50.0)
Hemoglobin: 12.6 g/dL — ABNORMAL LOW (ref 13.2–17.1)
Lymphs Abs: 1155 {cells}/uL (ref 850–3900)
MCH: 26.4 pg — ABNORMAL LOW (ref 27.0–33.0)
MCHC: 32.1 g/dL (ref 32.0–36.0)
MCV: 82.4 fL (ref 80.0–100.0)
MPV: 10 fL (ref 7.5–12.5)
Monocytes Relative: 5.7 %
Neutro Abs: 5692 {cells}/uL (ref 1500–7800)
Neutrophils Relative %: 74.9 %
Platelets: 326 10*3/uL (ref 140–400)
RBC: 4.77 10*6/uL (ref 4.20–5.80)
RDW: 12.8 % (ref 11.0–15.0)
Total Lymphocyte: 15.2 %
WBC: 7.6 10*3/uL (ref 3.8–10.8)

## 2023-03-26 LAB — COMPREHENSIVE METABOLIC PANEL
AG Ratio: 1.5 (calc) (ref 1.0–2.5)
ALT: 13 U/L (ref 9–46)
AST: 11 U/L (ref 10–35)
Albumin: 4 g/dL (ref 3.6–5.1)
Alkaline phosphatase (APISO): 30 U/L — ABNORMAL LOW (ref 35–144)
BUN/Creatinine Ratio: 16 (calc) (ref 6–22)
BUN: 24 mg/dL (ref 7–25)
CO2: 23 mmol/L (ref 20–32)
Calcium: 10.2 mg/dL (ref 8.6–10.3)
Chloride: 100 mmol/L (ref 98–110)
Creat: 1.5 mg/dL — ABNORMAL HIGH (ref 0.70–1.35)
Globulin: 2.6 g/dL (ref 1.9–3.7)
Glucose, Bld: 281 mg/dL — ABNORMAL HIGH (ref 65–99)
Potassium: 4.7 mmol/L (ref 3.5–5.3)
Sodium: 135 mmol/L (ref 135–146)
Total Bilirubin: 0.5 mg/dL (ref 0.2–1.2)
Total Protein: 6.6 g/dL (ref 6.1–8.1)

## 2023-04-07 ENCOUNTER — Ambulatory Visit: Payer: Medicare Other | Admitting: Primary Care

## 2023-04-12 ENCOUNTER — Ambulatory Visit: Payer: Medicare Other | Admitting: Family Medicine

## 2023-04-13 ENCOUNTER — Encounter: Payer: Self-pay | Admitting: Primary Care

## 2023-04-15 ENCOUNTER — Encounter: Payer: Self-pay | Admitting: Primary Care

## 2023-04-15 ENCOUNTER — Ambulatory Visit (INDEPENDENT_AMBULATORY_CARE_PROVIDER_SITE_OTHER): Payer: Medicare Other | Admitting: Primary Care

## 2023-04-15 VITALS — BP 122/70 | HR 100 | Temp 98.5°F | Ht 71.0 in | Wt 190.0 lb

## 2023-04-15 DIAGNOSIS — Z794 Long term (current) use of insulin: Secondary | ICD-10-CM

## 2023-04-15 DIAGNOSIS — T148XXA Other injury of unspecified body region, initial encounter: Secondary | ICD-10-CM | POA: Diagnosis not present

## 2023-04-15 DIAGNOSIS — E1165 Type 2 diabetes mellitus with hyperglycemia: Secondary | ICD-10-CM

## 2023-04-15 HISTORY — DX: Other injury of unspecified body region, initial encounter: T14.8XXA

## 2023-04-15 MED ORDER — TIRZEPATIDE 2.5 MG/0.5ML ~~LOC~~ SOAJ
2.5000 mg | SUBCUTANEOUS | 0 refills | Status: DC
Start: 2023-04-15 — End: 2023-05-11

## 2023-04-15 NOTE — Assessment & Plan Note (Signed)
Hypoglycemia since discontinuation of Ozempic. Discussed options for treatment which included Mounjaro versus SGL 2 treatment.  Start Mounjaro 2.5 mg weekly x 4 weeks, then increase to 5 mg weekly thereafter. Continue famotidine 20 mg daily. He will update if his symptoms return.  Continue Lantus 45 units daily, metformin XR 1000 mg daily, glipizide XL 5 mg daily.  Follow-up in late December

## 2023-04-15 NOTE — Patient Instructions (Signed)
Start tirzepitide Christian Petty) for diabetes. Start by injecting 2.5 mg into the skin once weekly for 4 weeks, then increase to 5 mg once weekly thereafter. Please notify me once you've used your last 2.5 mg pen so that I can prescribe the next dose.   Continue your other diabetes medications.  Please schedule a physical to meet with me in late December.   It was a pleasure to see you today!

## 2023-04-15 NOTE — Assessment & Plan Note (Signed)
Appears to be healing well. Continue to keep area clean and dry.  Discussed signs and symptoms of infection

## 2023-04-15 NOTE — Progress Notes (Signed)
Subjective:    Patient ID: Christian Petty, male    DOB: [birth year], 67 y.o.   MRN: [id number]  HPI  Christian Petty is a very pleasant 67 y.o. male with a history of type 2 diabetes, hypertrophic obstructive cardiomyopathy, BPH, tobacco use, hyperlipidemia who presents today for follow-up of last visit and to discuss upper extremity pain.  He was last evaluated on 03/25/2023 for symptoms of chest pain.  After further discussion symptoms were more representative of esophageal reflux suspect to be secondary to his Ozempic.  We completed an EKG for which his cardiologist reviewed and agreed that symptoms were likely not cardiac.  We discontinued his Ozempic and initiated famotidine 20 mg daily.  He is here today for follow-up.  Since his last visit his chest pain has resolved. He is able to belch without difficulty. His glucose is running in the high 100s and low 200s. He is compliant to his Lantus, Glipizide, and metformin.   He was hopping of the riding lawn mower quickly, bent forward to but the bolt in the floor of the door, fell forward onto propane tanks. He scrapped the right upper extremity and hit his head. He has multiple scabs to the right upper extremity which have scabbed. His wife is applying neosporin. He denies headaches, shoulder pain.   Review of Systems  Respiratory:  Negative for shortness of breath.   Cardiovascular:  Negative for chest pain.  Gastrointestinal:  Negative for abdominal pain.  Skin:  Positive for wound.  Neurological:  Negative for numbness.         Past Medical History:  Diagnosis Date   Allergy    Arthritis    BPH (benign prostatic hyperplasia)    Chronic back pain    HTN (hypertension)    states under control with med., has been on med. x 2-3 yr.   Hyperlipidemia, mild    Insulin dependent diabetes mellitus    Trigger thumb of left hand 10/2017    Social History   Socioeconomic History   Marital status: Married    Spouse name: Not on file   Number  of children: 2   Years of education: Not on file   Highest education level: Not on file  Occupational History   Occupation: truck driver   Tobacco Use   Smoking status: Former    Current packs/day: 0.00    Types: Cigarettes    Quit date: 11/16/2010    Years since quitting: 12.4   Smokeless tobacco: Never  Vaping Use   Vaping status: Never Used  Substance and Sexual Activity   Alcohol use: Yes    Comment: occasionally   Drug use: No   Sexual activity: Not on file  Other Topics Concern   Not on file  Social History Narrative   Married.   2 children, 2 grandchildren.   Works for himself as a Naval architect.   Social Determinants of Health   Financial Resource Strain: Low Risk  (09/09/2022)   Overall Financial Resource Strain (CARDIA)    Difficulty of Paying Living Expenses: Not hard at all  Recent Concern: Financial Resource Strain - Medium Risk (08/19/2022)   Overall Financial Resource Strain (CARDIA)    Difficulty of Paying Living Expenses: Somewhat hard  Food Insecurity: No Food Insecurity (09/09/2022)   Hunger Vital Sign    Worried About Running Out of Food in the Last Year: Never true    Ran Out of Food in the Last Year: Never true  Transportation Needs:  No Transportation Needs (09/09/2022)   PRAPARE - Administrator, Civil Service (Medical): No    Lack of Transportation (Non-Medical): No  Physical Activity: Inactive (09/09/2022)   Exercise Vital Sign    Days of Exercise per Week: 0 days    Minutes of Exercise per Session: 0 min  Stress: No Stress Concern Present (09/09/2022)   [name] of Occupational Health - Occupational Stress Questionnaire    Feeling of Stress : Not at all  Social Connections: Unknown (09/09/2022)   Social Connection and Isolation Panel [NHANES]    Frequency of Communication with Friends and Family: More than three times a week    Frequency of Social Gatherings with Friends and Family: Once a week    Attends Religious Services: Not  on Insurance claims handler of Clubs or Organizations: Not on file    Attends Banker Meetings: Not on file    Marital Status: Married  Intimate Partner Violence: Not At Risk (09/09/2022)   Humiliation, Afraid, Rape, and Kick questionnaire    Fear of Current or Ex-Partner: No    Emotionally Abused: No    Physically Abused: No    Sexually Abused: No    Past Surgical History:  Procedure Laterality Date   COLONOSCOPY  06/19/2006   COLONOSCOPY WITH PROPOFOL  09/28/2016   INGUINAL HERNIA REPAIR     LUMBAR LAMINECTOMY     x 2   POLYPECTOMY     TRIGGER FINGER RELEASE Right 10/04/2017   Procedure: RELEASE TRIGGER THUMB, RIGHT;  Surgeon: Teryl Lucy, MD;  Location: [facility];  Service: Orthopedics;  Laterality: Right;    Family History  Problem Relation Age of Onset   Diabetes Father    Breast cancer Mother    Stroke Other 66       F   Colon cancer Neg Hx    Colon polyps Neg Hx    Esophageal cancer Neg Hx    Rectal cancer Neg Hx    Stomach cancer Neg Hx     Not on File  Current Outpatient Medications on File Prior to Visit  Medication Sig Dispense Refill   atorvastatin (LIPITOR) 40 MG tablet TAKE 1 TABLET BY MOUTH EVERY DAY FOR CHOLESTEROL 90 tablet 1   CONTOUR NEXT TEST test strip Use as instructed 100 strip 2   famotidine (PEPCID) 20 MG tablet Take 1 tablet (20 mg total) by mouth daily. for heartburn. 90 tablet 0   fenofibrate 160 MG tablet TAKE 1 TABLET(160 MG) BY MOUTH DAILY FOR CHOLESTEROL 90 tablet 2   glipiZIDE (GLUCOTROL XL) 5 MG 24 hr tablet Take 1 tablet (5 mg total) by mouth daily with breakfast. for diabetes. 90 tablet 1   insulin glargine (LANTUS SOLOSTAR) 100 UNIT/ML Solostar Pen ADMINISTER 45 UNITS UNDER THE SKIN AT BEDTIME FOR DIABETES 45 mL 1   Insulin Pen Needle (CAREFINE PEN NEEDLES) 31G X 8 MM MISC Use with insulin once nightly at bedtime. 100 each 3   Lancets MISC Use as instructed to test blood sugar daily 100 each 0   losartan  (COZAAR) 100 MG tablet TAKE 1 TABLET BY MOUTH EVERY DAY FOR BLOOD PRESSURE 90 tablet 3   metFORMIN (GLUCOPHAGE-XR) 500 MG 24 hr tablet Take 2 tablets (1,000 mg total) by mouth daily with breakfast. for diabetes. 180 tablet 1   tamsulosin (FLOMAX) 0.4 MG CAPS capsule TAKE ONE CAPSULE BY MOUTH ONCE AFTER BREAKFAST FOR URINE FLOW 90 capsule 3  No current facility-administered medications on file prior to visit.    BP 122/70   Pulse 100   Temp 98.5 F (36.9 C) (Temporal)   Ht 5\' 11"  (1.803 m)   Wt 190 lb (86.2 kg)   SpO2 98%   BMI 26.50 kg/m  Objective:   Physical Exam Cardiovascular:     Rate and Rhythm: Normal rate and regular rhythm.  Pulmonary:     Effort: Pulmonary effort is normal.     Breath sounds: Normal breath sounds.  Musculoskeletal:     Cervical back: Neck supple.  Skin:    General: Skin is warm and dry.     Comments: Multiple scabbed abrasions to right upper extremity extending from the humeral region down to hand.  No surrounding erythema, drainage, warmth.  Neurological:     Mental Status: He is alert and oriented to person, place, and time.  Psychiatric:        Mood and Affect: Mood normal.           Assessment & Plan:  Type 2 diabetes mellitus with hyperglycemia, with long-term current use of insulin (HCC) Assessment & Plan: Hypoglycemia since discontinuation of Ozempic. Discussed options for treatment which included Mounjaro versus SGL 2 treatment.  Start Mounjaro 2.5 mg weekly x 4 weeks, then increase to 5 mg weekly thereafter. Continue famotidine 20 mg daily. He will update if his symptoms return.  Continue Lantus 45 units daily, metformin XR 1000 mg daily, glipizide XL 5 mg daily.  Follow-up in late December  Orders: -     Tirzepatide; Inject 2.5 mg into the skin once a week. for diabetes.  Dispense: 2 mL; Refill: 0  Skin abrasion Assessment & Plan: Appears to be healing well. Continue to keep area clean and dry.  Discussed signs and  symptoms of infection         Doreene Nest, NP

## 2023-04-18 ENCOUNTER — Other Ambulatory Visit: Payer: Self-pay

## 2023-04-18 DIAGNOSIS — E782 Mixed hyperlipidemia: Secondary | ICD-10-CM

## 2023-04-18 DIAGNOSIS — E119 Type 2 diabetes mellitus without complications: Secondary | ICD-10-CM

## 2023-04-18 MED ORDER — ATORVASTATIN CALCIUM 40 MG PO TABS
40.0000 mg | ORAL_TABLET | Freq: Every day | ORAL | 1 refills | Status: DC
Start: 2023-04-18 — End: 2023-11-07

## 2023-04-18 MED ORDER — LANTUS SOLOSTAR 100 UNIT/ML ~~LOC~~ SOPN
PEN_INJECTOR | SUBCUTANEOUS | 1 refills | Status: DC
Start: 2023-04-18 — End: 2023-08-02

## 2023-04-18 NOTE — Addendum Note (Signed)
Addended by: Benedict Needy on: 04/18/2023 03:18 PM   Modules accepted: Orders

## 2023-05-09 ENCOUNTER — Other Ambulatory Visit: Payer: Self-pay | Admitting: Primary Care

## 2023-05-09 DIAGNOSIS — Z794 Long term (current) use of insulin: Secondary | ICD-10-CM

## 2023-05-09 NOTE — Telephone Encounter (Signed)
Please call patient:  How is he doing on the [facility] injectable medication for diabetes? If doing well, then the next step would be to increase to 5 mg weekly.

## 2023-05-10 NOTE — Telephone Encounter (Signed)
Unable to reach patient. Left voicemail to return call to our office.   

## 2023-05-11 NOTE — Telephone Encounter (Signed)
Noted.  Will change Mounjaro dose to 5 mg weekly.

## 2023-05-11 NOTE — Telephone Encounter (Signed)
Called and spoke with patient, he states he is doing well on the [facility] and tolerating just fine. He is agreeable to increasing dose to 5mg .

## 2023-05-20 ENCOUNTER — Encounter: Payer: Medicare Other | Admitting: Pharmacist

## 2023-06-14 ENCOUNTER — Other Ambulatory Visit: Payer: Self-pay

## 2023-06-14 DIAGNOSIS — E1165 Type 2 diabetes mellitus with hyperglycemia: Secondary | ICD-10-CM

## 2023-06-14 DIAGNOSIS — R079 Chest pain, unspecified: Secondary | ICD-10-CM

## 2023-06-14 MED ORDER — FAMOTIDINE 20 MG PO TABS
20.0000 mg | ORAL_TABLET | Freq: Every day | ORAL | 0 refills | Status: DC
Start: 2023-06-14 — End: 2023-09-14

## 2023-06-14 MED ORDER — METFORMIN HCL ER 500 MG PO TB24: 1000.0000 mg | ORAL_TABLET | Freq: Every day | ORAL | 0 refills | Status: DC

## 2023-06-15 ENCOUNTER — Ambulatory Visit (INDEPENDENT_AMBULATORY_CARE_PROVIDER_SITE_OTHER): Payer: Medicare Other | Admitting: Family Medicine

## 2023-06-15 ENCOUNTER — Encounter: Payer: Self-pay | Admitting: Family Medicine

## 2023-06-15 VITALS — BP 120/60 | HR 111 | Temp 98.0°F | Ht 71.0 in | Wt 199.4 lb

## 2023-06-15 DIAGNOSIS — R058 Other specified cough: Secondary | ICD-10-CM | POA: Diagnosis not present

## 2023-06-15 MED ORDER — CEFDINIR 300 MG PO CAPS: 300.0000 mg | ORAL_CAPSULE | Freq: Two times a day (BID) | ORAL | 0 refills | Status: DC

## 2023-06-15 MED ORDER — GUAIFENESIN-CODEINE 100-10 MG/5ML PO SOLN: ORAL | 0 refills | Status: DC

## 2023-06-15 NOTE — Progress Notes (Signed)
Established Patient Office Visit  Subjective   Patient ID: Christian Petty, male    DOB: [birth year]  Age: 67 y.o. MRN: [id number]  Chief Complaint  Patient presents with   Cough    Patient complains of cough, x2 days    HPI   Christian Petty is seen as a work in with 3-day history of productive cough.  Thick yellowish mucus interspersed with dry cough.  No fever.  Quit smoking 15 years ago.  Does do some vaping.  Denies any major body aches.  Has had some nasal congestion.  No known sick contacts.  Past medical history significant for type 2 diabetes, hypertension, HOCM,     echo 8/22 EF greater than 75% with LVOT 40 mmHg with Valsalva.  He denies any dyspnea, chest pains, syncope, or dizziness with day-to-day activities.  Past Medical History:  Diagnosis Date   Allergy    Arthritis    BPH (benign prostatic hyperplasia)    Chronic back pain    HTN (hypertension)    states under control with med., has been on med. x 2-3 yr.   Hyperlipidemia, mild    Insulin dependent diabetes mellitus    Trigger thumb of left hand 10/2017   Past Surgical History:  Procedure Laterality Date   COLONOSCOPY  06/19/2006   COLONOSCOPY WITH PROPOFOL  09/28/2016   INGUINAL HERNIA REPAIR     LUMBAR LAMINECTOMY     x 2   POLYPECTOMY     TRIGGER FINGER RELEASE Right 10/04/2017   Procedure: RELEASE TRIGGER THUMB, RIGHT;  Surgeon: Teryl Lucy, MD;  Location:  [facility];  Service: Orthopedics;  Laterality: Right;    reports that he quit smoking about 12 years ago. His smoking use included cigarettes. He has never used smokeless tobacco. He reports current alcohol use. He reports that he does not use drugs. family history includes Breast cancer in his mother; Diabetes in his father; Stroke (age of onset: 5) in an other family member. Not on File  Review of Systems  Constitutional:  Negative for chills and fever.  HENT:  Positive for congestion. Negative for sinus pain and sore throat.    Respiratory:  Positive for cough.   Cardiovascular:  Negative for chest pain.  Neurological:  Negative for dizziness and loss of consciousness.      Objective:     BP 120/60 (BP Location: Left Arm, Patient Position: Sitting, Cuff Size: Normal)   Pulse (!) 111   Temp 98 F (36.7 C) (Oral)   Ht 5\' 11"  (1.803 m)   Wt 199 lb 6.4 oz (90.4 kg)   SpO2 98%   BMI 27.81 kg/m  BP Readings from Last 3 Encounters:  06/15/23 120/60  04/15/23 122/70  03/25/23 138/60   Wt Readings from Last 3 Encounters:  06/15/23 199 lb 6.4 oz (90.4 kg)  04/15/23 190 lb (86.2 kg)  03/25/23 189 lb (85.7 kg)      Physical Exam Vitals reviewed.  Constitutional:      General: He is not in acute distress.    Appearance: He is not ill-appearing.  HENT:     Right Ear: Tympanic membrane normal.     Left Ear: Tympanic membrane normal.  Cardiovascular:     Rate and Rhythm: Normal rate.     Comments: Systolic heart murmur noted left sternal border and right upper sternal border. Pulmonary:     Effort: Pulmonary effort is normal.     Breath sounds: No wheezing or rales.  Neurological:     Mental Status: He is alert.      No results found for any visits on 06/15/23.    The 10-year ASCVD risk score (Arnett DK, et al., 2019) is: 32.2%    Assessment & Plan:   Patient is a 67 year old male with prior smoking history, type 2 diabetes, HOCM, hypertension seen today with productive cough over several days.  Does not have any red flags such as fever or hypoxia.  O2 sat 98% room air.  High risk with his age, diabetes, smoking history.  -We elected to go and cover with Omnicef 300 mg twice daily for 7 days -Stressed importance of good hydration's particularly in view of his hypertrophic obstructive cardiomyopathy to avoid decreased preload. -His rate was up a bit today but regular and he is not sure what his baseline is.  We have encouraged him to discuss with cardiology.  May benefit from beta-blocker or  diltiazem for better heart rate control with his hypertrophic obstructive cardiomyopathy  Evelena Peat, MD

## 2023-06-21 NOTE — Progress Notes (Unsigned)
    [name], MD, CAQ Sports Medicine [facility] [address] [place], [address]  Phone: [phone]  FAX: [phone]  Christian Petty - 67 y.o. male  MRN [id number]  Date of Birth: April 26, 1956  Date: 06/22/2023  PCP: Doreene Nest, NP  Referral: Doreene Nest, NP  No chief complaint on file.  Subjective:   Christian Petty is a 67 y.o. very pleasant male patient with There is no height or weight on file to calculate BMI. who presents with the following:  [place] is a very pleasant patient of Dr. Chestine Spore, he presents today for an acute sick visit.  He saw Dr. Caryl Never on June 15, 2023, at that time he placed him on Omnicef.    Review of Systems is noted in the HPI, as appropriate  Objective:   There were no vitals taken for this visit.  GEN: No acute distress; alert,appropriate. PULM: Breathing comfortably in no respiratory distress PSYCH: Normally interactive.   Laboratory and Imaging Data:  Assessment and Plan:   ***

## 2023-06-22 ENCOUNTER — Ambulatory Visit: Payer: Medicare Other | Admitting: Family Medicine

## 2023-06-22 ENCOUNTER — Encounter: Payer: Self-pay | Admitting: Family Medicine

## 2023-06-22 VITALS — BP 112/60 | HR 92 | Temp 97.3°F | Ht 71.0 in | Wt 199.1 lb

## 2023-06-22 DIAGNOSIS — J208 Acute bronchitis due to other specified organisms: Secondary | ICD-10-CM

## 2023-06-22 DIAGNOSIS — R0602 Shortness of breath: Secondary | ICD-10-CM | POA: Diagnosis not present

## 2023-06-22 MED ORDER — GUAIFENESIN-CODEINE 100-10 MG/5ML PO SOLN: 5.0000 mL | Freq: Every evening | ORAL | 0 refills | Status: DC | PRN

## 2023-06-22 MED ORDER — DOXYCYCLINE HYCLATE 100 MG PO TABS: 100.0000 mg | ORAL_TABLET | Freq: Two times a day (BID) | ORAL | 0 refills | Status: DC

## 2023-06-22 MED ORDER — PREDNISONE 20 MG PO TABS: ORAL_TABLET | ORAL | 0 refills | Status: DC

## 2023-06-29 ENCOUNTER — Other Ambulatory Visit: Payer: Self-pay

## 2023-06-29 DIAGNOSIS — E1165 Type 2 diabetes mellitus with hyperglycemia: Secondary | ICD-10-CM

## 2023-06-29 MED ORDER — GLIPIZIDE ER 5 MG PO TB24: 5.0000 mg | ORAL_TABLET | Freq: Every day | ORAL | 0 refills | Status: DC

## 2023-07-15 ENCOUNTER — Encounter: Payer: Medicare Other | Admitting: Primary Care

## 2023-07-28 ENCOUNTER — Ambulatory Visit: Payer: Self-pay | Admitting: Primary Care

## 2023-07-28 NOTE — Telephone Encounter (Signed)
  Chief Complaint: blood glucose 240 Symptoms: urinary frequency and urgency (patient states urgency bad enough he doesn't have time to pull the car over and has had incontinence), elevated blood glucose Frequency: x1 yesterday Pertinent Negatives: Patient denies dizziness, nausea, vomiting, changes in vision Disposition: [] ED /[] Urgent Care (no appt availability in office) / [x] Appointment(In office/virtual)/ []  [facility]/ [] Home Care/ [] Refused Recommended Disposition /[] [facility]/ []  Follow-up with PCP Additional Notes: Patient states in the past month he tried to pick up his Mounjaro  and it was over $600 so he has stopped taking it. Patient waited til the new year to check on the price, but states it was still around $600. Last dose of Mounjaro  about 3 weeks ago. Patient states he is unable to check his blood glucose right now but states yesterday was 240 and denies any additional readings higher than 240. Patient requesting appointment with PCP to speak about changing his diabetic medications since he can no longer afford Mounjaro .   Copied from CRM [id number]. Topic: Clinical - Red Word Triage >> Jul 28, 2023  2:28 PM Joanell B wrote: Red Word that prompted transfer to Nurse Triage: Pt stated that when he last checked his blood sugar the top number was 240 Reason for Disposition  [1] Symptoms of high blood sugar (e.g., abnormally thirsty, frequent urination, weight loss) AND [2] not able to test blood glucose  Answer Assessment - Initial Assessment Questions 1. BLOOD GLUCOSE: What is your blood glucose level?      240.  2. ONSET: When did you check the blood glucose?     Yesterday around 3pm.  3. USUAL RANGE: What is your glucose level usually? (e.g., usual fasting morning value, usual evening value)     140-170. On a good day it may be 120.  4. KETONES: Do you check for ketones (urine or blood test strips)? If Yes, ask: What does the test show now?       Denies.  5. TYPE 1 or 2:  Do you know what type of diabetes you have?  (e.g., Type 1, Type 2, Gestational; doesn't know)      Type 2.  6. INSULIN : Do you take insulin ? What type of insulin (s) do you use? What is the mode of delivery? (syringe, pen; injection or pump)?      Yes; Lantus  and Mounjaro  injections.  7. DIABETES PILLS: Do you take any pills for your diabetes? If Yes, ask: Have you missed taking any pills recently?     Patient states he has been taking all of his other diabetic medications. He denies any missed doses, except the Mounjaro  he has stopped taking due to price.  8. OTHER SYMPTOMS: Do you have any symptoms? (e.g., fever, frequent urination, difficulty breathing, dizziness, weakness, vomiting)     Frequent urination, urgency so severe he will have incontinence  because he can not pull over in enough time.  Protocols used: Diabetes - High Blood Sugar-A-AH

## 2023-07-28 NOTE — Telephone Encounter (Signed)
 Noted, will evaluate.

## 2023-07-29 ENCOUNTER — Ambulatory Visit: Payer: Medicare Other | Admitting: Primary Care

## 2023-08-01 ENCOUNTER — Encounter: Payer: Self-pay | Admitting: Primary Care

## 2023-08-02 ENCOUNTER — Other Ambulatory Visit: Payer: Self-pay | Admitting: Primary Care

## 2023-08-02 ENCOUNTER — Encounter: Payer: Self-pay | Admitting: Internal Medicine

## 2023-08-02 ENCOUNTER — Ambulatory Visit (INDEPENDENT_AMBULATORY_CARE_PROVIDER_SITE_OTHER): Payer: Medicare Other | Admitting: Internal Medicine

## 2023-08-02 VITALS — BP 102/60 | HR 97 | Temp 99.0°F | Ht 71.0 in | Wt 198.0 lb

## 2023-08-02 DIAGNOSIS — I10 Essential (primary) hypertension: Secondary | ICD-10-CM

## 2023-08-02 DIAGNOSIS — N400 Enlarged prostate without lower urinary tract symptoms: Secondary | ICD-10-CM

## 2023-08-02 DIAGNOSIS — E119 Type 2 diabetes mellitus without complications: Secondary | ICD-10-CM

## 2023-08-02 DIAGNOSIS — M47896 Other spondylosis, lumbar region: Secondary | ICD-10-CM | POA: Diagnosis not present

## 2023-08-02 DIAGNOSIS — M9903 Segmental and somatic dysfunction of lumbar region: Secondary | ICD-10-CM | POA: Diagnosis not present

## 2023-08-02 DIAGNOSIS — Z7985 Long-term (current) use of injectable non-insulin antidiabetic drugs: Secondary | ICD-10-CM

## 2023-08-02 DIAGNOSIS — M9904 Segmental and somatic dysfunction of sacral region: Secondary | ICD-10-CM | POA: Diagnosis not present

## 2023-08-02 DIAGNOSIS — M9905 Segmental and somatic dysfunction of pelvic region: Secondary | ICD-10-CM | POA: Diagnosis not present

## 2023-08-02 MED ORDER — EMPAGLIFLOZIN 10 MG PO TABS: 10.0000 mg | ORAL_TABLET | Freq: Every day | ORAL | 3 refills | Status: DC

## 2023-08-02 MED ORDER — LANTUS SOLOSTAR 100 UNIT/ML ~~LOC~~ SOPN: 70.0000 [IU] | PEN_INJECTOR | Freq: Every day | SUBCUTANEOUS | 3 refills | Status: DC

## 2023-08-02 NOTE — Patient Instructions (Signed)
 Please start the new medication jardiance  daily. It can make you urinate more--and occasionally causes genital rash.  For now, reduce the lantus  to 70 units daily. If your fasting sugars get back down under 150, you can reduce the insulin  dose by 2-3 units every few days to get back to about 50 units.  Set up with Mallie in 3 months

## 2023-08-02 NOTE — Progress Notes (Signed)
 Subjective:    Patient ID: Christian Petty, male    DOB: August 25, 1955, 68 y.o.   MRN: [id number]  HPI Here with concerns about his blood sugars  Sugars have been high Stopped the mounjaro ----went up to over $600 --so didn't fill it. This was 6-7 weeks ago Sugars running in high 200's Has increased the lantus ---has taken an extra 38-40 and it has helped Now down to 160--which is good for him  Did have some dietary indiscretion around the holidays Now getting back to proper choices  Current Outpatient Medications on File Prior to Visit  Medication Sig Dispense Refill   atorvastatin  (LIPITOR) 40 MG tablet Take 1 tablet (40 mg total) by mouth daily. 90 tablet 1   CONTOUR NEXT TEST test strip Use as instructed 100 strip 2   famotidine  (PEPCID ) 20 MG tablet Take 1 tablet (20 mg total) by mouth daily. for heartburn. 90 tablet 0   fenofibrate  160 MG tablet TAKE 1 TABLET(160 MG) BY MOUTH DAILY FOR CHOLESTEROL 90 tablet 2   glipiZIDE  (GLUCOTROL  XL) 5 MG 24 hr tablet Take 1 tablet (5 mg total) by mouth daily with breakfast. for diabetes. 90 tablet 0   guaiFENesin -codeine  100-10 MG/5ML syrup Take 5 mLs by mouth at bedtime as needed for cough. 120 mL 0   insulin  glargine (LANTUS  SOLOSTAR) 100 UNIT/ML Solostar Pen ADMINISTER 45 UNITS UNDER THE SKIN AT BEDTIME for diabetes. 45 mL 1   Insulin  Pen Needle (CAREFINE PEN NEEDLES) 31G X 8 MM MISC Use with insulin  once nightly at bedtime. 100 each 3   Lancets MISC Use as instructed to test blood sugar daily 100 each 0   losartan  (COZAAR ) 100 MG tablet TAKE 1 TABLET BY MOUTH EVERY DAY FOR BLOOD PRESSURE 90 tablet 3   metFORMIN  (GLUCOPHAGE -XR) 500 MG 24 hr tablet Take 2 tablets (1,000 mg total) by mouth daily with breakfast. for diabetes. 180 tablet 0   tamsulosin  (FLOMAX ) 0.4 MG CAPS capsule TAKE ONE CAPSULE BY MOUTH ONCE AFTER BREAKFAST FOR URINE FLOW 90 capsule 3   No current facility-administered medications on file prior to visit.    Not on File  Past  Medical History:  Diagnosis Date   Allergy    Arthritis    BPH (benign prostatic hyperplasia)    Chronic back pain    HTN (hypertension)    states under control with med., has been on med. x 2-3 yr.   Hyperlipidemia, mild    Insulin  dependent diabetes mellitus    Trigger thumb of left hand 10/2017    Past Surgical History:  Procedure Laterality Date   COLONOSCOPY  06/19/2006   COLONOSCOPY WITH PROPOFOL   09/28/2016   INGUINAL HERNIA REPAIR     LUMBAR LAMINECTOMY     x 2   POLYPECTOMY     TRIGGER FINGER RELEASE Right 10/04/2017   Procedure: RELEASE TRIGGER THUMB, RIGHT;  Surgeon: Josefina Chew, MD;  Location: [facility];  Service: Orthopedics;  Laterality: Right;    Family History  Problem Relation Age of Onset   Diabetes Father    Breast cancer Mother    Stroke Other 77       F   Colon cancer Neg Hx    Colon polyps Neg Hx    Esophageal cancer Neg Hx    Rectal cancer Neg Hx    Stomach cancer Neg Hx     Social History   Socioeconomic History   Marital status: Married    Spouse name: Not on  file   Number of children: 2   Years of education: Not on file   Highest education level: Not on file  Occupational History   Occupation: truck driver   Tobacco Use   Smoking status: Former    Current packs/day: 0.00    Types: Cigarettes    Quit date: 11/16/2010    Years since quitting: 12.7   Smokeless tobacco: Never  Vaping Use   Vaping status: Never Used  Substance and Sexual Activity   Alcohol use: Yes    Comment: occasionally   Drug use: No   Sexual activity: Not on file  Other Topics Concern   Not on file  Social History Narrative   Married.   2 children, 2 grandchildren.   Works for himself as a naval architect.   Social Drivers of Corporate Investment Banker Strain: Low Risk  (09/09/2022)   Overall Financial Resource Strain (CARDIA)    Difficulty of Paying Living Expenses: Not hard at all  Recent Concern: Financial Resource Strain - Medium  Risk (08/19/2022)   Overall Financial Resource Strain (CARDIA)    Difficulty of Paying Living Expenses: Somewhat hard  Food Insecurity: No Food Insecurity (09/09/2022)   Hunger Vital Sign    Worried About Running Out of Food in the Last Year: Never true    Ran Out of Food in the Last Year: Never true  Transportation Needs: No Transportation Needs (09/09/2022)   PRAPARE - Administrator, Civil Service (Medical): No    Lack of Transportation (Non-Medical): No  Physical Activity: Inactive (09/09/2022)   Exercise Vital Sign    Days of Exercise per Week: 0 days    Minutes of Exercise per Session: 0 min  Stress: No Stress Concern Present (09/09/2022)   [name] of Occupational Health - Occupational Stress Questionnaire    Feeling of Stress : Not at all  Social Connections: Unknown (09/09/2022)   Social Connection and Isolation Panel [NHANES]    Frequency of Communication with Friends and Family: More than three times a week    Frequency of Social Gatherings with Friends and Family: Once a week    Attends Religious Services: Not on Marketing Executive or Organizations: Not on file    Attends Banker Meetings: Not on file    Marital Status: Married  Intimate Partner Violence: Not At Risk (09/09/2022)   Humiliation, Afraid, Rape, and Kick questionnaire    Fear of Current or Ex-Partner: No    Emotionally Abused: No    Physically Abused: No    Sexually Abused: No   Review of Systems Weight is stable Didn't tolerate ozempic --got bad heartburn    Objective:   Physical Exam Constitutional:      Appearance: Normal appearance.  Neurological:     Mental Status: He is alert.  Psychiatric:        Mood and Affect: Mood normal.        Behavior: Behavior normal.            Assessment & Plan:

## 2023-08-02 NOTE — Assessment & Plan Note (Signed)
 Very high sugars once he stopped the mounjaro ---can't afford it Sugars back to baseline with almost doubling the lantus  (to 80-90 daily) Still on the glipizide  and metformin   Will try adding jardiance  10 (has mild CKD so can help this also) Decrease insulin  for now to 70 units daily---and can wean if sugars get back under control

## 2023-08-03 NOTE — Telephone Encounter (Signed)
 LVM for patient to c/b and schedule.

## 2023-08-03 NOTE — Telephone Encounter (Signed)
 Patient is due for CPE/follow up, this will be required prior to any further refills.  Please schedule, thank you!

## 2023-08-04 DIAGNOSIS — M9904 Segmental and somatic dysfunction of sacral region: Secondary | ICD-10-CM | POA: Diagnosis not present

## 2023-08-04 DIAGNOSIS — M9903 Segmental and somatic dysfunction of lumbar region: Secondary | ICD-10-CM | POA: Diagnosis not present

## 2023-08-04 DIAGNOSIS — M9905 Segmental and somatic dysfunction of pelvic region: Secondary | ICD-10-CM | POA: Diagnosis not present

## 2023-08-23 ENCOUNTER — Telehealth: Payer: Self-pay

## 2023-08-23 NOTE — Telephone Encounter (Signed)
 Copied from CRM [id number]. Topic: Clinical - Medication Question >> Aug 23, 2023  9:32 AM Pinkey ORN wrote: Reason for CRM: Medication Refills >> Aug 23, 2023  9:35 AM Pinkey ORN wrote: Patient states he's needing to see Clark,Katherine K, NP in order to get his medication refilled, his physical isn't schedule until 07/13/24 but he's saying he needs to get in with her sooner than that so that he's able to get his medication. Patient is requesting a call back at [phone]

## 2023-08-23 NOTE — Telephone Encounter (Signed)
Spoke to pt, scheduled pt for Friday, 2/7.

## 2023-08-23 NOTE — Telephone Encounter (Signed)
Please call and schedule patient a follow up with Christian Petty

## 2023-08-26 ENCOUNTER — Encounter: Payer: Self-pay | Admitting: Primary Care

## 2023-08-26 ENCOUNTER — Ambulatory Visit (INDEPENDENT_AMBULATORY_CARE_PROVIDER_SITE_OTHER): Payer: Medicare Other | Admitting: Primary Care

## 2023-08-26 ENCOUNTER — Telehealth: Payer: Self-pay

## 2023-08-26 VITALS — BP 136/76 | HR 91 | Temp 97.4°F | Ht 71.0 in | Wt 200.0 lb

## 2023-08-26 DIAGNOSIS — E7800 Pure hypercholesterolemia, unspecified: Secondary | ICD-10-CM

## 2023-08-26 DIAGNOSIS — Z794 Long term (current) use of insulin: Secondary | ICD-10-CM | POA: Diagnosis not present

## 2023-08-26 DIAGNOSIS — I10 Essential (primary) hypertension: Secondary | ICD-10-CM

## 2023-08-26 DIAGNOSIS — E1165 Type 2 diabetes mellitus with hyperglycemia: Secondary | ICD-10-CM | POA: Diagnosis not present

## 2023-08-26 DIAGNOSIS — Z125 Encounter for screening for malignant neoplasm of prostate: Secondary | ICD-10-CM | POA: Diagnosis not present

## 2023-08-26 DIAGNOSIS — Z0000 Encounter for general adult medical examination without abnormal findings: Secondary | ICD-10-CM | POA: Diagnosis not present

## 2023-08-26 DIAGNOSIS — Z1211 Encounter for screening for malignant neoplasm of colon: Secondary | ICD-10-CM

## 2023-08-26 DIAGNOSIS — N400 Enlarged prostate without lower urinary tract symptoms: Secondary | ICD-10-CM

## 2023-08-26 LAB — LIPID PANEL
Cholesterol: 171 mg/dL (ref 0–200)
HDL: 31.7 mg/dL — ABNORMAL LOW (ref >39.00)
LDL Cholesterol: 96 mg/dL (ref 0–99)
NonHDL: 139.44
Total CHOL/HDL Ratio: 5
Triglycerides: 216 mg/dL — ABNORMAL HIGH (ref 0.0–149.0)
VLDL: 43.2 mg/dL — ABNORMAL HIGH (ref 0.0–40.0)

## 2023-08-26 LAB — BASIC METABOLIC PANEL
BUN: 21 mg/dL (ref 6–23)
CO2: 26 meq/L (ref 19–32)
Calcium: 9.4 mg/dL (ref 8.4–10.5)
Chloride: 101 meq/L (ref 96–112)
Creatinine, Ser: 1.32 mg/dL (ref 0.40–1.50)
GFR: 55.36 mL/min — ABNORMAL LOW (ref >60.00)
Glucose, Bld: 224 mg/dL — ABNORMAL HIGH (ref 70–99)
Potassium: 4.4 meq/L (ref 3.5–5.1)
Sodium: 138 meq/L (ref 135–145)

## 2023-08-26 LAB — PSA: PSA: 0.86 ng/mL (ref 0.10–4.00)

## 2023-08-26 LAB — HEMOGLOBIN A1C: Hgb A1c MFr Bld: 10.9 % — ABNORMAL HIGH (ref 4.6–6.5)

## 2023-08-26 MED ORDER — EMPAGLIFLOZIN 10 MG PO TABS: 10.0000 mg | ORAL_TABLET | Freq: Every day | ORAL | 0 refills | Status: DC

## 2023-08-26 MED ORDER — FREESTYLE LIBRE 3 PLUS SENSOR MISC: 1 refills | Status: DC

## 2023-08-26 NOTE — Assessment & Plan Note (Signed)
 Stable.  Continue losartan  100 mg daily. BMP pending.

## 2023-08-26 NOTE — Assessment & Plan Note (Signed)
 Deteriorated.  Again, stressed the importance of taking glucose levels regularly, especially while on insulin . Start freestyle libre 3+ CGM.  Prescription sent to pharmacy.  Discussed instructions for use.  Continue metformin  ER 1000 mg daily, glipizide  XL 5 mg daily.  Add Jardiance  10 mg daily for renal protection and diabetes control.  Follow-up in 3 months.

## 2023-08-26 NOTE — Telephone Encounter (Signed)
 Called and spoke with patient, he stated that 90 day supply of Jardiance  was going to cost him $450. Patient states Dr. Letvak and Polly Brink mentioned something about a one year coupon. He would like more information on this as the cost right now is too much.

## 2023-08-26 NOTE — Progress Notes (Signed)
 Subjective:    Patient ID: Christian Petty, male    DOB: Oct 21, 1955, 68 y.o.   MRN: [id number]  HPI  Christian Petty is a very pleasant 68 y.o. male who presents today for complete physical and follow up of chronic conditions.  Immunizations: -Tetanus: Completed in 2015 -Influenza: Declines influenza vaccine.  -Shingles: Never completed -Pneumonia: Completed last in 2016, delcines   Diet: Fair diet.  Exercise: No regular exercise.  Eye exam: Completes annually  Dental exam: Completes semi-annually    Colonoscopy: Completed in 2018, due 2021, never completed. Declines.   PSA: Due  BP Readings from Last 3 Encounters:  08/26/23 136/76  08/02/23 102/60  06/22/23 112/60    He is not checking glucose levels daily, only once weekly as he hates needles. He has not tried CGM.     Review of Systems  Constitutional:  Negative for unexpected weight change.  HENT:  Negative for rhinorrhea.   Respiratory:  Negative for cough and shortness of breath.   Cardiovascular:  Negative for chest pain.  Gastrointestinal:  Negative for constipation and diarrhea.  Endocrine: Positive for polyuria.  Genitourinary:  Negative for difficulty urinating.  Musculoskeletal:  Negative for arthralgias and myalgias.  Skin:  Negative for rash.  Allergic/Immunologic: Negative for environmental allergies.  Neurological:  Negative for dizziness, numbness and headaches.  Psychiatric/Behavioral:  The patient is not nervous/anxious.          Past Medical History:  Diagnosis Date   Allergy    Arthritis    BPH (benign prostatic hyperplasia)    Chronic back pain    Dizziness 01/28/2021   HOMOCYSTINEMIA 11/21/2007   Qualifier: Diagnosis of   By: Amon MD, Aloysius BRAVO.     Replacing diagnoses that were inactivated after the 10/18/22 regulatory import     HTN (hypertension)    states under control with med., has been on med. x 2-3 yr.   Hyperlipidemia, mild    Insulin  dependent diabetes mellitus    Skin abrasion  04/15/2023   Trigger thumb of left hand 10/2017   Trigger thumb of right hand 10/04/2017    Social History   Socioeconomic History   Marital status: Married    Spouse name: Not on file   Number of children: 2   Years of education: Not on file   Highest education level: Not on file  Occupational History   Occupation: truck driver   Tobacco Use   Smoking status: Former    Current packs/day: 0.00    Types: Cigarettes    Quit date: 11/16/2010    Years since quitting: 12.7   Smokeless tobacco: Never  Vaping Use   Vaping status: Never Used  Substance and Sexual Activity   Alcohol use: Yes    Comment: occasionally   Drug use: No   Sexual activity: Not on file  Other Topics Concern   Not on file  Social History Narrative   Married.   2 children, 2 grandchildren.   Works for himself as a naval architect.   Social Drivers of Corporate Investment Banker Strain: Low Risk  (09/09/2022)   Overall Financial Resource Strain (CARDIA)    Difficulty of Paying Living Expenses: Not hard at all  Recent Concern: Financial Resource Strain - Medium Risk (08/19/2022)   Overall Financial Resource Strain (CARDIA)    Difficulty of Paying Living Expenses: Somewhat hard  Food Insecurity: No Food Insecurity (09/09/2022)   Hunger Vital Sign    Worried About Running Out of Food  in the Last Year: Never true    Ran Out of Food in the Last Year: Never true  Transportation Needs: No Transportation Needs (09/09/2022)   PRAPARE - Administrator, Civil Service (Medical): No    Lack of Transportation (Non-Medical): No  Physical Activity: Inactive (09/09/2022)   Exercise Vital Sign    Days of Exercise per Week: 0 days    Minutes of Exercise per Session: 0 min  Stress: No Stress Concern Present (09/09/2022)   [name] of Occupational Health - Occupational Stress Questionnaire    Feeling of Stress : Not at all  Social Connections: Unknown (09/09/2022)   Social Connection and Isolation Panel  [NHANES]    Frequency of Communication with Friends and Family: More than three times a week    Frequency of Social Gatherings with Friends and Family: Once a week    Attends Religious Services: Not on Insurance Claims Handler of Clubs or Organizations: Not on file    Attends Banker Meetings: Not on file    Marital Status: Married  Intimate Partner Violence: Not At Risk (09/09/2022)   Humiliation, Afraid, Rape, and Kick questionnaire    Fear of Current or Ex-Partner: No    Emotionally Abused: No    Physically Abused: No    Sexually Abused: No    Past Surgical History:  Procedure Laterality Date   COLONOSCOPY  06/19/2006   COLONOSCOPY WITH PROPOFOL   09/28/2016   INGUINAL HERNIA REPAIR     LUMBAR LAMINECTOMY     x 2   POLYPECTOMY     TRIGGER FINGER RELEASE Right 10/04/2017   Procedure: RELEASE TRIGGER THUMB, RIGHT;  Surgeon: Josefina Chew, MD;  Location:  SURGERY CENTER;  Service: Orthopedics;  Laterality: Right;    Family History  Problem Relation Age of Onset   Diabetes Father    Breast cancer Mother    Stroke Other 70       F   Colon cancer Neg Hx    Colon polyps Neg Hx    Esophageal cancer Neg Hx    Rectal cancer Neg Hx    Stomach cancer Neg Hx     Not on File  Current Outpatient Medications on File Prior to Visit  Medication Sig Dispense Refill   atorvastatin  (LIPITOR) 40 MG tablet Take 1 tablet (40 mg total) by mouth daily. 90 tablet 1   CONTOUR NEXT TEST test strip Use as instructed 100 strip 2   famotidine  (PEPCID ) 20 MG tablet Take 1 tablet (20 mg total) by mouth daily. for heartburn. 90 tablet 0   fenofibrate  160 MG tablet TAKE 1 TABLET(160 MG) BY MOUTH DAILY FOR CHOLESTEROL 90 tablet 2   glipiZIDE  (GLUCOTROL  XL) 5 MG 24 hr tablet Take 1 tablet (5 mg total) by mouth daily with breakfast. for diabetes. 90 tablet 0   insulin  glargine (LANTUS  SOLOSTAR) 100 UNIT/ML Solostar Pen Inject 70 Units into the skin at bedtime. 75 mL 3   Insulin  Pen  Needle (CAREFINE PEN NEEDLES) 31G X 8 MM MISC Use with insulin  once nightly at bedtime. 100 each 3   Lancets MISC Use as instructed to test blood sugar daily 100 each 0   losartan  (COZAAR ) 100 MG tablet TAKE 1 TABLET BY MOUTH EVERY DAY FOR BLOOD PRESSURE 90 tablet 0   metFORMIN  (GLUCOPHAGE -XR) 500 MG 24 hr tablet Take 2 tablets (1,000 mg total) by mouth daily with breakfast. for diabetes. 180 tablet 0   tamsulosin  (FLOMAX )  0.4 MG CAPS capsule TAKE 1 CAPSULE BY MOUTH ONCE DAILY AFTER BREAKFAST FOR URINE FLOW 90 capsule 0   No current facility-administered medications on file prior to visit.    BP 136/76   Pulse 91   Temp (!) 97.4 F (36.3 C) (Temporal)   Ht 5' 11 (1.803 m)   Wt 200 lb (90.7 kg)   SpO2 99%   BMI 27.89 kg/m  Objective:   Physical Exam HENT:     Right Ear: Tympanic membrane and ear canal normal.     Left Ear: Tympanic membrane and ear canal normal.  Eyes:     Pupils: Pupils are equal, round, and reactive to light.  Cardiovascular:     Rate and Rhythm: Normal rate and regular rhythm.  Pulmonary:     Effort: Pulmonary effort is normal.     Breath sounds: Normal breath sounds.  Abdominal:     General: Bowel sounds are normal.     Palpations: Abdomen is soft.     Tenderness: There is no abdominal tenderness.  Musculoskeletal:        General: Normal range of motion.     Cervical back: Neck supple.  Skin:    General: Skin is warm and dry.  Neurological:     Mental Status: He is alert and oriented to person, place, and time.     Cranial Nerves: No cranial nerve deficit.     Deep Tendon Reflexes:     Reflex Scores:      Patellar reflexes are 2+ on the right side and 2+ on the left side. Psychiatric:        Mood and Affect: Mood normal.           Assessment & Plan:  Preventative health care Assessment & Plan: Declines all immunizations Colonoscopy overdue, referral placed to GI PSA due and pending.  Discussed the importance of a healthy diet and  regular exercise in order for weight loss, and to reduce the risk of further co-morbidity.  Exam stable. Labs pending.  Follow up in 1 year for repeat physical.    Type 2 diabetes mellitus with hyperglycemia, with long-term current use of insulin  (HCC) Assessment & Plan: Deteriorated.  Again, stressed the importance of taking glucose levels regularly, especially while on insulin . Start freestyle libre 3+ CGM.  Prescription sent to pharmacy.  Discussed instructions for use.  Continue metformin  ER 1000 mg daily, glipizide  XL 5 mg daily.  Add Jardiance  10 mg daily for renal protection and diabetes control.  Follow-up in 3 months.  Orders: -     Microalbumin / creatinine urine ratio -     FreeStyle Libre 3 Plus Sensor; Use to check blood sugar continuously. Change sensor every 15 days.  Dispense: 6 each; Refill: 1 -     Basic metabolic panel -     Hemoglobin A1c -     Empagliflozin ; Take 1 tablet (10 mg total) by mouth daily. for diabetes.  Dispense: 90 tablet; Refill: 0  Screening for colon cancer -     Ambulatory referral to Gastroenterology  Screening for prostate cancer -     PSA  Essential hypertension Assessment & Plan: Stable.  Continue losartan  100 mg daily. BMP pending.  Orders: -     Basic metabolic panel  Pure hypercholesterolemia Assessment & Plan: Repeat lipid panel pending.  Continue atorvastatin  40 mg daily, fenofibrate  160 mg daily.  Orders: -     Lipid panel  Benign prostatic hyperplasia without lower urinary tract  symptoms Assessment & Plan: Historically controlled, but more frequent symptoms given uncontrolled diabetes.  Continue tamsulosin  0.4 mg daily for now.         Zaniya Mcaulay K Rich Paprocki, NP

## 2023-08-26 NOTE — Assessment & Plan Note (Addendum)
 Repeat lipid panel pending.  Continue atorvastatin  40 mg daily, fenofibrate  160 mg daily.

## 2023-08-26 NOTE — Telephone Encounter (Deleted)
 I do not know anything about the coupon card.  He can ask the pharmacy.  He can also call his insurance company to see if they will cover Farxiga.  This is similar to Jardiance .  In the meantime, I would like to increase his glipizide  pill for diabetes to 10 mg daily.  I will send a new prescription to his pharmacy.

## 2023-08-26 NOTE — Telephone Encounter (Signed)
 Copied from CRM [id number]. Topic: Clinical - Medication Question >> Aug 26, 2023  3:02 PM Franky GRADE wrote: Reason for CRM: Patient is calling regarding his empagliflozin  (JARDIANCE ) 10 MG TABS tablet prescription. Per patient the coupon that was given to him the pharmacy states it's only good for 90 days. He states he cannot afford it. He would like to speak with Dr.Clark's nurse. Call back number [phone].

## 2023-08-26 NOTE — Addendum Note (Signed)
 Addended by: Cade Olberding K on: 08/26/2023 04:37 PM   Modules accepted: Orders

## 2023-08-26 NOTE — Assessment & Plan Note (Signed)
 Declines all immunizations  Colonoscopy overdue, referral placed to GI PSA due and pending.  Discussed the importance of a healthy diet and regular exercise in order for weight loss, and to reduce the risk of further co-morbidity.  Exam stable. Labs pending.  Follow up in 1 year for repeat physical.

## 2023-08-26 NOTE — Telephone Encounter (Signed)
 Will wait for his A1c test to return.  See result note.

## 2023-08-26 NOTE — Patient Instructions (Addendum)
 Stop by the lab prior to leaving today. I will notify you of your results once received.   Pick up the freestyle libre 3+ sensors from the pharmacy to take your blood sugars.  Change the sensor every 15 days.  You will receive a phone call from GI regarding the colonoscopy.  Start Jardiance  10 mg daily for diabetes and kidney protection.  Take 1 tablet by mouth every morning.  Please schedule a follow up visit for 3 months.  It was a pleasure to see you today!

## 2023-08-26 NOTE — Assessment & Plan Note (Signed)
 Historically controlled, but more frequent symptoms given uncontrolled diabetes.  Continue tamsulosin  0.4 mg daily for now.

## 2023-08-29 LAB — MICROALBUMIN / CREATININE URINE RATIO
Creatinine,U: 103.3 mg/dL
Microalb Creat Ratio: 53.5 mg/g — ABNORMAL HIGH (ref 0.0–30.0)
Microalb, Ur: 5.5 mg/dL — ABNORMAL HIGH (ref 0.0–1.9)

## 2023-08-30 ENCOUNTER — Telehealth: Payer: Self-pay

## 2023-08-30 NOTE — Telephone Encounter (Signed)
Copied from CRM [id number]. Topic: Clinical - Prescription Issue >> Aug 30, 2023  2:28 PM Fredrich Romans wrote: Reason for CRM: patient called in stating that he's not going to be able to purchase medication  empagliflozin (JARDIANCE) 10 MG TABS tablet  he stated that the coupon is not  to  going to help due to him being on medicare. He said that he is okay for now since he just purchased 3  months worth of the medication,however he will need an alternative plan after these 3 months.

## 2023-08-30 NOTE — Telephone Encounter (Signed)
Noted.  Please have him call me blood sugar readings 2 weeks after he has been taking the Jardiance.

## 2023-08-31 NOTE — Telephone Encounter (Signed)
Called patient and reviewed all information. Patient verbalized understanding. Will call if any further questions.

## 2023-09-09 ENCOUNTER — Ambulatory Visit: Payer: Medicare Other | Admitting: Primary Care

## 2023-09-14 ENCOUNTER — Other Ambulatory Visit: Payer: Self-pay

## 2023-09-14 DIAGNOSIS — R079 Chest pain, unspecified: Secondary | ICD-10-CM

## 2023-09-15 MED ORDER — FAMOTIDINE 20 MG PO TABS: 20.0000 mg | ORAL_TABLET | Freq: Every day | ORAL | 2 refills | Status: DC

## 2023-09-28 ENCOUNTER — Telehealth: Payer: Self-pay

## 2023-09-28 DIAGNOSIS — H53001 Unspecified amblyopia, right eye: Secondary | ICD-10-CM | POA: Diagnosis not present

## 2023-09-28 DIAGNOSIS — H52203 Unspecified astigmatism, bilateral: Secondary | ICD-10-CM | POA: Diagnosis not present

## 2023-09-28 DIAGNOSIS — H25813 Combined forms of age-related cataract, bilateral: Secondary | ICD-10-CM | POA: Diagnosis not present

## 2023-09-28 LAB — HM DIABETES EYE EXAM

## 2023-09-28 NOTE — Telephone Encounter (Signed)
 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 States he no longer needs letter and we will recheck at his May appt. Nothing further needed at this time.

## 2023-09-28 NOTE — Telephone Encounter (Signed)
 Copied from CRM [id number]. Topic: General - Other >> Sep 28, 2023  3:28 PM Adele Barthel wrote: Reason for CRM:   Patient is calling in regards to letter he needs completed by Friday for his DOT physical. He did not specify the name of the form. Reported the CMA would know what he was talking about, no further info with probing questions  CB# [phone]

## 2023-09-29 ENCOUNTER — Encounter: Payer: Self-pay | Admitting: Primary Care

## 2023-10-01 ENCOUNTER — Other Ambulatory Visit: Payer: Self-pay

## 2023-10-01 ENCOUNTER — Emergency Department (HOSPITAL_COMMUNITY)
Admission: EM | Admit: 2023-10-01 | Discharge: 2023-10-01 | Disposition: A | Discharge status: Home / Self Care | Attending: Emergency Medicine | Admitting: Emergency Medicine

## 2023-10-01 ENCOUNTER — Encounter (HOSPITAL_COMMUNITY): Payer: Self-pay | Admitting: *Deleted

## 2023-10-01 DIAGNOSIS — E1165 Type 2 diabetes mellitus with hyperglycemia: Secondary | ICD-10-CM | POA: Diagnosis not present

## 2023-10-01 DIAGNOSIS — I10 Essential (primary) hypertension: Secondary | ICD-10-CM | POA: Diagnosis not present

## 2023-10-01 DIAGNOSIS — Z794 Long term (current) use of insulin: Secondary | ICD-10-CM | POA: Insufficient documentation

## 2023-10-01 DIAGNOSIS — E119 Type 2 diabetes mellitus without complications: Secondary | ICD-10-CM | POA: Insufficient documentation

## 2023-10-01 DIAGNOSIS — Z79899 Other long term (current) drug therapy: Secondary | ICD-10-CM | POA: Insufficient documentation

## 2023-10-01 DIAGNOSIS — R739 Hyperglycemia, unspecified: Secondary | ICD-10-CM

## 2023-10-01 LAB — URINALYSIS, ROUTINE W REFLEX MICROSCOPIC
Bilirubin Urine: NEGATIVE
Glucose, UA: >=500 mg/dL — AB
Hgb urine dipstick: NEGATIVE
Ketones, ur: NEGATIVE mg/dL
Leukocytes,Ua: NEGATIVE
Nitrite: NEGATIVE
Protein, ur: NEGATIVE mg/dL
Specific Gravity, Urine: 1.024 (ref 1.005–1.030)
pH: 6 (ref 5.0–8.0)

## 2023-10-01 LAB — CBG MONITORING, ED
Glucose-Capillary: 511 mg/dL (ref 70–99)
Glucose-Capillary: 288 mg/dL — ABNORMAL HIGH (ref 70–99)

## 2023-10-01 LAB — COMPREHENSIVE METABOLIC PANEL
ALT: 25 U/L (ref 0–44)
AST: 17 U/L (ref 15–41)
Albumin: 3.7 g/dL (ref 3.5–5.0)
Alkaline Phosphatase: 44 U/L (ref 38–126)
Anion gap: 11 (ref 5–15)
BUN: 23 mg/dL (ref 8–23)
CO2: 22 mmol/L (ref 22–32)
Calcium: 9.6 mg/dL (ref 8.9–10.3)
Chloride: 99 mmol/L (ref 98–111)
Creatinine, Ser: 1.41 mg/dL — ABNORMAL HIGH (ref 0.61–1.24)
GFR, Estimated: 54 mL/min — ABNORMAL LOW (ref >60)
Glucose, Bld: 519 mg/dL (ref 70–99)
Potassium: 5.2 mmol/L — ABNORMAL HIGH (ref 3.5–5.1)
Sodium: 132 mmol/L — ABNORMAL LOW (ref 135–145)
Total Bilirubin: 0.4 mg/dL (ref 0.0–1.2)
Total Protein: 7.1 g/dL (ref 6.5–8.1)

## 2023-10-01 LAB — CBC WITH DIFFERENTIAL/PLATELET
Abs Immature Granulocytes: 0.04 10*3/uL (ref 0.00–0.07)
Basophils Absolute: 0.1 10*3/uL (ref 0.0–0.1)
Basophils Relative: 1 %
Eosinophils Absolute: 0.3 10*3/uL (ref 0.0–0.5)
Eosinophils Relative: 4 %
HCT: 42.3 % (ref 39.0–52.0)
Hemoglobin: 13.7 g/dL (ref 13.0–17.0)
Immature Granulocytes: 1 %
Lymphocytes Relative: 19 %
Lymphs Abs: 1.2 10*3/uL (ref 0.7–4.0)
MCH: 27.5 pg (ref 26.0–34.0)
MCHC: 32.4 g/dL (ref 30.0–36.0)
MCV: 84.9 fL (ref 80.0–100.0)
Monocytes Absolute: 0.5 10*3/uL (ref 0.1–1.0)
Monocytes Relative: 8 %
Neutro Abs: 4.3 10*3/uL (ref 1.7–7.7)
Neutrophils Relative %: 67 %
Platelets: 293 10*3/uL (ref 150–400)
RBC: 4.98 MIL/uL (ref 4.22–5.81)
RDW: 14.3 % (ref 11.5–15.5)
WBC: 6.3 10*3/uL (ref 4.0–10.5)
nRBC: 0 % (ref 0.0–0.2)

## 2023-10-01 LAB — I-STAT VENOUS BLOOD GAS, ED
Acid-base deficit: 3 mmol/L — ABNORMAL HIGH (ref 0.0–2.0)
Bicarbonate: 22.5 mmol/L (ref 20.0–28.0)
Calcium, Ion: 1.27 mmol/L (ref 1.15–1.40)
HCT: 42 % (ref 39.0–52.0)
Hemoglobin: 14.3 g/dL (ref 13.0–17.0)
O2 Saturation: 30 %
Potassium: 5.4 mmol/L — ABNORMAL HIGH (ref 3.5–5.1)
Sodium: 133 mmol/L — ABNORMAL LOW (ref 135–145)
TCO2: 24 mmol/L (ref 22–32)
pCO2, Ven: 41.2 mmHg — ABNORMAL LOW (ref 44–60)
pH, Ven: 7.344 (ref 7.25–7.43)
pO2, Ven: 20 mmHg — CL (ref 32–45)

## 2023-10-01 LAB — BETA-HYDROXYBUTYRIC ACID: Beta-Hydroxybutyric Acid: 0.15 mmol/L (ref 0.05–0.27)

## 2023-10-01 MED ORDER — INSULIN ASPART 100 UNIT/ML IJ SOLN
10.0000 [IU] | Freq: Once | INTRAMUSCULAR | Status: AC
  Administered 2023-10-01: 10 [IU] via SUBCUTANEOUS

## 2023-10-01 MED ORDER — LACTATED RINGERS IV BOLUS
1000.0000 mL | Freq: Once | INTRAVENOUS | Status: AC
  Administered 2023-10-01: 1000 mL via INTRAVENOUS

## 2023-10-01 NOTE — ED Triage Notes (Signed)
 BIB family from home for hyperglycemia, ongoing for last 2-3d. Takes jiardiance, glipizide and metformin. Denies recent infection or steroid use. 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 No known sick contacts. Denies pain, sob, NVD, fever, or cough. Alert, NAD, calm, interactive, steady gait. BS 511 in triage.

## 2023-10-01 NOTE — Discharge Instructions (Addendum)
 It was a pleasure taking part in your care.  As discussed, workup is reassuring.  We have brought your blood sugar down to 288.  Please continue taking all prescribed medciations at home for your diabetes.  Please follow-up with your primary care provider at your earliest convenience.  Please return to the ED with any new or worsening symptoms.  Please review the attached guide concerning diabetic eating choices.

## 2023-10-01 NOTE — ED Provider Notes (Signed)
 [place] EMERGENCY DEPARTMENT AT [facility] Provider Note   CSN: [id number] Arrival date & time: 10/01/23  1705     History  Chief Complaint  Patient presents with   Hyperglycemia    Christian Petty is a 68 y.o. male with medical history PPH, chronic back pain, hyperlipidemia, hypertension, type 2 diabetes.  Patient presents to ED for evaluation of hyperglycemia.  Apparently the patient has had issues controlling his sugar ever since discontinuing Ozempic due to heartburn.  Reports that he takes glipizide, Jardiance, metformin for his diabetes as well as Lantus.  States that over the last 3 days that sugars have been out of control.  He is endorsing excessive urination, excessive hunger, excessive thirst.  Denies abdominal pain, nausea, vomiting, chest pain, shortness of breath, confusion.  Reports last A1c was 10.3.   Hyperglycemia Associated symptoms: increased thirst and polyuria        Home Medications Prior to Admission medications   Medication Sig Start Date End Date Taking? Authorizing Provider  atorvastatin (LIPITOR) 40 MG tablet Take 1 tablet (40 mg total) by mouth daily. 04/18/23   Doreene Nest, NP  Continuous Glucose Sensor (FREESTYLE LIBRE 3 PLUS SENSOR) MISC Use to check blood sugar continuously. Change sensor every 15 days. 08/26/23   Doreene Nest, NP  CONTOUR NEXT TEST test strip Use as instructed 06/05/20   Doreene Nest, NP  empagliflozin (JARDIANCE) 10 MG TABS tablet Take 1 tablet (10 mg total) by mouth daily. for diabetes. 08/26/23   Doreene Nest, NP  famotidine (PEPCID) 20 MG tablet Take 1 tablet (20 mg total) by mouth daily. for heartburn. 09/15/23   Doreene Nest, NP  fenofibrate 160 MG tablet TAKE 1 TABLET(160 MG) BY MOUTH DAILY FOR CHOLESTEROL 08/29/22   Doreene Nest, NP  insulin glargine (LANTUS SOLOSTAR) 100 UNIT/ML Solostar Pen Inject 70 Units into the skin at bedtime. 08/02/23   Karie Schwalbe, MD  Insulin Pen Needle  (CAREFINE PEN NEEDLES) 31G X 8 MM MISC Use with insulin once nightly at bedtime. 08/19/22   Doreene Nest, NP  Lancets MISC Use as instructed to test blood sugar daily 06/05/20   Doreene Nest, NP  losartan (COZAAR) 100 MG tablet TAKE 1 TABLET BY MOUTH EVERY DAY FOR BLOOD PRESSURE 08/03/23   Doreene Nest, NP  metFORMIN (GLUCOPHAGE-XR) 500 MG 24 hr tablet Take 2 tablets (1,000 mg total) by mouth daily with breakfast. for diabetes. 06/14/23   Doreene Nest, NP  tamsulosin (FLOMAX) 0.4 MG CAPS capsule TAKE 1 CAPSULE BY MOUTH ONCE DAILY AFTER BREAKFAST FOR URINE FLOW 08/03/23   Doreene Nest, NP      Allergies    Patient has no known allergies.    Review of Systems   Review of Systems  Endocrine: Positive for polydipsia, polyphagia and polyuria.  All other systems reviewed and are negative.   Physical Exam Updated Vital Signs BP (!) 150/73 (BP Location: Right Arm)   Pulse 95   Temp 98.1 F (36.7 C)   Resp 18   Wt 90.7 kg   SpO2 99%   BMI 27.89 kg/m  Physical Exam Vitals and nursing note reviewed.  Constitutional:      General: He is not in acute distress.    Appearance: He is well-developed.  HENT:     Head: Normocephalic and atraumatic.  Eyes:     Conjunctiva/sclera: Conjunctivae normal.  Cardiovascular:     Rate and Rhythm: Normal rate  and regular rhythm.     Heart sounds: No murmur heard. Pulmonary:     Effort: Pulmonary effort is normal. No respiratory distress.     Breath sounds: Normal breath sounds.  Abdominal:     Palpations: Abdomen is soft.     Tenderness: There is no abdominal tenderness.  Musculoskeletal:        General: No swelling.     Cervical back: Neck supple.  Skin:    General: Skin is warm and dry.     Capillary Refill: Capillary refill takes less than 2 seconds.  Neurological:     General: No focal deficit present.     Mental Status: He is alert.     GCS: GCS eye subscore is 4. GCS verbal subscore is 5. GCS motor subscore is  6.     Cranial Nerves: Cranial nerves 2-12 are intact. No cranial nerve deficit.     Sensory: Sensation is intact. No sensory deficit.     Motor: Motor function is intact. No weakness.     Coordination: Coordination is intact. Heel to [facility] Test normal.     Comments: Alert and oriented x 3  Psychiatric:        Mood and Affect: Mood normal.     ED Results / Procedures / Treatments   Labs (all labs ordered are listed, but only abnormal results are displayed) Labs Reviewed  URINALYSIS, ROUTINE W REFLEX MICROSCOPIC - Abnormal; Notable for the following components:      Result Value   Color, Urine STRAW (*)    Glucose, UA >=500 (*)    Bacteria, UA RARE (*)    All other components within normal limits  COMPREHENSIVE METABOLIC PANEL - Abnormal; Notable for the following components:   Sodium 132 (*)    Potassium 5.2 (*)    Glucose, Bld 519 (*)    Creatinine, Ser 1.41 (*)    GFR, Estimated 54 (*)    All other components within normal limits  CBG MONITORING, ED - Abnormal; Notable for the following components:   Glucose-Capillary 511 (*)    All other components within normal limits  CBG MONITORING, ED - Abnormal; Notable for the following components:   Glucose-Capillary 288 (*)    All other components within normal limits  I-STAT VENOUS BLOOD GAS, ED - Abnormal; Notable for the following components:   pCO2, Ven 41.2 (*)    pO2, Ven 20 (*)    Acid-base deficit 3.0 (*)    Sodium 133 (*)    Potassium 5.4 (*)    All other components within normal limits  CBC WITH DIFFERENTIAL/PLATELET  BETA-HYDROXYBUTYRIC ACID  CBG MONITORING, ED    EKG None  Radiology No results found.  Procedures Procedures   Medications Ordered in ED Medications  lactated ringers bolus 1,000 mL (1,000 mLs Intravenous New Bag/Given 10/01/23 1909)  insulin aspart (novoLOG) injection 10 Units (10 Units Subcutaneous Given 10/01/23 1911)    ED Course/ Medical Decision Making/ A&P  Medical Decision  Making Amount and/or Complexity of Data Reviewed Labs: ordered.  Risk Prescription drug management.   68 year old who presents for evaluation of hyperglycemia.  Please see HPI for further details.  On examination patient is afebrile and nontachycardic.  His lung sounds are clear bilaterally, he is not hypoxic.  His abdomen is soft and compressible throughout.  His neurological examinations at baseline.  He is alert and oriented x 4.  Will collect CBC, CMP, i-STAT VBG, urinalysis, beta-hydroxybutyrate acid.  Patient CBC without leukocytosis  or anemia.  Metabolic panel shows sodium 132, potassium 5.2, glucose 519, creatinine 1.4.  Creatinine is at baseline.  Anion gap 11.  I-STAT ABG shows pCO2 41.2, venous pH 7.34.  Beta-hydroxybutyrate acid 0.15.  Urinalysis unremarkable does show glucose.  Patient given 10 units insulin, 1 L LR.  On reassessment of blood glucose, CBG has decreased to 288.  Patient requesting discharge.  At this time will discharge patient home.  Have advised him to continue checking sugars at home.  He states he does not check his sugars often.  I have voiced the importance of the patient checking his sugars and keeping up with CBG log.  He voices understanding.  I have advised the patient to continue taking all medications as prescribed and he voiced understanding.  He had all his questions answered his satisfaction.  He stable to discharge home.   Final Clinical Impression(s) / ED Diagnoses Final diagnoses:  Hyperglycemia    Rx / DC Orders ED Discharge Orders     None         Clent Ridges 10/01/23 2012    Benjiman Core, MD 10/01/23 2150

## 2023-10-04 ENCOUNTER — Telehealth: Payer: Self-pay | Admitting: Primary Care

## 2023-10-04 DIAGNOSIS — E1165 Type 2 diabetes mellitus with hyperglycemia: Secondary | ICD-10-CM

## 2023-10-04 MED ORDER — GLIPIZIDE ER 10 MG PO TB24: 10.0000 mg | ORAL_TABLET | Freq: Every day | ORAL | 0 refills | Status: DC

## 2023-10-04 MED ORDER — METFORMIN HCL ER 500 MG PO TB24: 1000.0000 mg | ORAL_TABLET | Freq: Every day | ORAL | 0 refills | Status: DC

## 2023-10-04 NOTE — Telephone Encounter (Signed)
 Called patient and reviewed all information. Patient verbalized understanding. Will call if any further questions.

## 2023-10-04 NOTE — Telephone Encounter (Signed)
 Please call patient:  Will send Rx for Glipizide XL 10 mg for him to take once daily.  Will send refill for metformin ER to his pharmacy now.

## 2023-10-04 NOTE — Telephone Encounter (Signed)
 Patient was in the ED at [facility] on 10/01/2023, agent tried to populate dates for a hospital follow-up and the soonest available with PCP was not until April 10th which is outside of the 14 day timeframe. Callback number for patient is [phone] to schedule.                Please advise if we may add this patient on or they could be seen by another provider for HFU

## 2023-10-04 NOTE — Telephone Encounter (Signed)
 Called and spoke with patient, he has appt scheduled 10/13/23 with Jae Dire, will use this as Hosp f/u.  Patient states he is completely out of Glipizide XL 5mg  and Metformin 500mg , requesting refill. Advised patient Glipizide is not listed on patients active medication list. He says he has been continuously taking that the entire time.

## 2023-10-04 NOTE — Addendum Note (Signed)
 Addended by: Doreene Nest on: 10/04/2023 02:20 PM   Modules accepted: Orders

## 2023-10-12 ENCOUNTER — Ambulatory Visit (INDEPENDENT_AMBULATORY_CARE_PROVIDER_SITE_OTHER)

## 2023-10-12 VITALS — Ht 71.0 in | Wt 200.0 lb

## 2023-10-12 DIAGNOSIS — Z0000 Encounter for general adult medical examination without abnormal findings: Secondary | ICD-10-CM | POA: Diagnosis not present

## 2023-10-12 NOTE — Patient Instructions (Addendum)
 Christian Petty , Thank you for taking time to come for your Medicare Wellness Visit. I appreciate your ongoing commitment to your health goals. Please review the following plan we discussed and let me know if I can assist you in the future.   Referrals/Orders/Follow-Ups/Clinician Recommendations:   Please call your eye provider Emily Filbert) and schedule your annual eye exam.   This is a list of the screening recommended for you and due dates:  Health Maintenance  Topic Date Due   Pneumonia Vaccine (1 of 2 - PCV) 12/28/1960   Colon Cancer Screening  09/29/2019   DTaP/Tdap/Td vaccine (2 - Td or Tdap) 09/08/2023   Flu Shot  10/17/2023*   Zoster (Shingles) Vaccine (1 of 2) 11/23/2023*   Hemoglobin A1C  02/23/2024   Complete foot exam   04/14/2024   Yearly kidney health urinalysis for diabetes  08/25/2024   Eye exam for diabetics  09/27/2024   Yearly kidney function blood test for diabetes  09/30/2024   Medicare Annual Wellness Visit  10/11/2024   Hepatitis C Screening  Completed   HPV Vaccine  Aged Out   COVID-19 Vaccine  Discontinued  *Topic was postponed. The date shown is not the original due date.    Advanced directives: (Declined) Advance directive discussed with you today. Even though you declined this today, please call our office should you change your mind, and we can give you the proper paperwork for you to fill out.  Next Medicare Annual Wellness Visit scheduled for next year: Yes 10/12/24 @ 1:40pm televisit

## 2023-10-12 NOTE — Progress Notes (Addendum)
 Subjective:   Christian Petty is a 68 y.o. who presents for a Medicare Wellness preventive visit.  Visit Complete: Virtual I connected with  Christian Petty on 10/12/23 by a audio enabled telemedicine application and verified that I am speaking with the correct person using two identifiers.  Patient Location: Home  Provider Location: Office/Clinic  I discussed the limitations of evaluation and management by telemedicine. The patient expressed understanding and agreed to proceed.  Vital Signs: Because this visit was a virtual/telehealth visit, some criteria may be missing or patient reported. Any vitals not documented were not able to be obtained and vitals that have been documented are patient reported.  VideoDeclined- This patient declined Librarian, academic. Therefore the visit was completed with audio only.  Persons Participating in Visit: Patient.  AWV Questionnaire: No: Patient Medicare AWV questionnaire was not completed prior to this visit.  Cardiac Risk Factors include: advanced age (>13men, >61 women);diabetes mellitus;dyslipidemia;male gender;hypertension;sedentary lifestyle     Objective:    Today's Vitals   10/12/23 0812 10/12/23 0813  Weight: 200 lb (90.7 kg)   Height: 5\' 11"  (1.803 m)   PainSc:  0-No pain   Body mass index is 27.89 kg/m.     10/12/2023    8:24 AM 10/01/2023    5:43 PM 09/09/2022   10:36 AM 10/28/2017   12:01 PM 09/28/2017    4:43 PM 01/18/2017    9:18 AM 09/20/2016    2:38 PM  Advanced Directives  Does Patient Have a Medical Advance Directive? No No No No No Yes Yes  Type of Building surveyor of Healthcare Power of Attorney in Chart?     No - copy requested    Would patient like information on creating a medical advance directive?   No - Patient declined No - Patient declined Yes (MAU/Ambulatory/Procedural Areas - Information given)      Current Medications (verified) Outpatient  Encounter Medications as of 10/12/2023  Medication Sig   atorvastatin (LIPITOR) 40 MG tablet Take 1 tablet (40 mg total) by mouth daily.   Continuous Glucose Sensor (FREESTYLE LIBRE 3 PLUS SENSOR) MISC Use to check blood sugar continuously. Change sensor every 15 days.   CONTOUR NEXT TEST test strip Use as instructed   empagliflozin (JARDIANCE) 10 MG TABS tablet Take 1 tablet (10 mg total) by mouth daily. for diabetes.   famotidine (PEPCID) 20 MG tablet Take 1 tablet (20 mg total) by mouth daily. for heartburn.   fenofibrate 160 MG tablet TAKE 1 TABLET(160 MG) BY MOUTH DAILY FOR CHOLESTEROL   glipiZIDE (GLUCOTROL XL) 10 MG 24 hr tablet Take 1 tablet (10 mg total) by mouth daily with breakfast. for diabetes.   insulin glargine (LANTUS SOLOSTAR) 100 UNIT/ML Solostar Pen Inject 70 Units into the skin at bedtime.   Insulin Pen Needle (CAREFINE PEN NEEDLES) 31G X 8 MM MISC Use with insulin once nightly at bedtime.   Lancets MISC Use as instructed to test blood sugar daily   losartan (COZAAR) 100 MG tablet TAKE 1 TABLET BY MOUTH EVERY DAY FOR BLOOD PRESSURE   metFORMIN (GLUCOPHAGE-XR) 500 MG 24 hr tablet Take 2 tablets (1,000 mg total) by mouth daily with breakfast. for diabetes.   tamsulosin (FLOMAX) 0.4 MG CAPS capsule TAKE 1 CAPSULE BY MOUTH ONCE DAILY AFTER BREAKFAST FOR URINE FLOW   No facility-administered encounter medications on file as of 10/12/2023.    Allergies (verified) Patient has no  known allergies.   History: Past Medical History:  Diagnosis Date   Allergy    Arthritis    BPH (benign prostatic hyperplasia)    Chronic back pain    Dizziness 01/28/2021   HOMOCYSTINEMIA 11/21/2007   Qualifier: Diagnosis of   By: Drue Novel MD, Nolon Rod.     Replacing diagnoses that were inactivated after the 10/18/22 regulatory import     HTN (hypertension)    states under control with med., has been on med. x 2-3 yr.   Hyperlipidemia, mild    Insulin dependent diabetes mellitus    Skin abrasion  04/15/2023   Trigger thumb of left hand 10/2017   Trigger thumb of right hand 10/04/2017   Past Surgical History:  Procedure Laterality Date   COLONOSCOPY  06/19/2006   COLONOSCOPY WITH PROPOFOL  09/28/2016   INGUINAL HERNIA REPAIR     LUMBAR LAMINECTOMY     x 2   POLYPECTOMY     TRIGGER FINGER RELEASE Right 10/04/2017   Procedure: RELEASE TRIGGER THUMB, RIGHT;  Surgeon: Teryl Lucy, MD;  Location: [facility];  Service: Orthopedics;  Laterality: Right;   Family History  Problem Relation Age of Onset   Diabetes Father    Breast cancer Mother    Stroke Other 69       F   Colon cancer Neg Hx    Colon polyps Neg Hx    Esophageal cancer Neg Hx    Rectal cancer Neg Hx    Stomach cancer Neg Hx    Social History   Socioeconomic History   Marital status: Married    Spouse name: Not on file   Number of children: 2   Years of education: Not on file   Highest education level: Not on file  Occupational History   Occupation: truck driver   Tobacco Use   Smoking status: Former    Current packs/day: 0.00    Types: Cigarettes    Quit date: 11/16/2010    Years since quitting: 12.9   Smokeless tobacco: Never  Vaping Use   Vaping status: Never Used  Substance and Sexual Activity   Alcohol use: Yes    Comment: occasionally   Drug use: No   Sexual activity: Not on file  Other Topics Concern   Not on file  Social History Narrative   Married.   2 children, 2 grandchildren.   Works for himself as a Naval architect.   Social Drivers of Corporate investment banker Strain: Low Risk  (10/12/2023)   Overall Financial Resource Strain (CARDIA)    Difficulty of Paying Living Expenses: Not hard at all  Food Insecurity: No Food Insecurity (10/12/2023)   Hunger Vital Sign    Worried About Running Out of Food in the Last Year: Never true    Ran Out of Food in the Last Year: Never true  Transportation Needs: No Transportation Needs (10/12/2023)   PRAPARE - Therapist, art (Medical): No    Lack of Transportation (Non-Medical): No  Physical Activity: Inactive (10/12/2023)   Exercise Vital Sign    Days of Exercise per Week: 0 days    Minutes of Exercise per Session: 0 min  Stress: No Stress Concern Present (10/12/2023)   [name] of Occupational Health - Occupational Stress Questionnaire    Feeling of Stress : Not at all  Social Connections: Moderately Isolated (10/12/2023)   Social Connection and Isolation Panel [NHANES]    Frequency of Communication  with Friends and Family: More than three times a week    Frequency of Social Gatherings with Friends and Family: Never    Attends Religious Services: Never    Database administrator or Organizations: No    Attends Engineer, structural: Never    Marital Status: Married    Tobacco Counseling Counseling given: Not Answered    Clinical Intake:  Pre-visit preparation completed: Yes  Pain : No/denies pain Pain Score: 0-No pain     BMI - recorded: 27.89 Nutritional Status: BMI 25 -29 Overweight Nutritional Risks: None Diabetes: Yes CBG done?: No Did pt. bring in CBG monitor from home?: No  Lab Results  Component Value Date   HGBA1C 10.9 (H) 08/26/2023   HGBA1C 7.7 (A) 12/28/2022   HGBA1C 6.2 (A) 09/07/2022     How often do you need to have someone help you when you read instructions, pamphlets, or other written materials from your doctor or pharmacy?: 1 - Never  Interpreter Needed?: No  Comments: liveswith wife Information entered by :: B.Cj Edgell,LPN   Activities of Daily Living     10/12/2023    8:24 AM  In your present state of health, do you have any difficulty performing the following activities:  Hearing? 1  Vision? 1  Difficulty concentrating or making decisions? 0  Walking or climbing stairs? 0  Dressing or bathing? 0  Doing errands, shopping? 0  Preparing Food and eating ? N  Using the Toilet? N  In the past six months, have you  accidently leaked urine? N  Do you have problems with loss of bowel control? N  Managing your Medications? N  Managing your Finances? N  Housekeeping or managing your Housekeeping? N    Patient Care Team: Doreene Nest, NP as PCP - General (Internal Medicine) Jake Bathe, MD as PCP - Cardiology (Cardiology) Kathyrn Sheriff, [facility] (Inactive) as Pharmacist (Pharmacist) Manning Charity, OD as Referring Physician (Optometry)  Indicate any recent Medical Services you may have received from other than [facility] providers in the past year (date may be approximate).     Assessment:   This is a routine wellness examination for Christian Petty.  Hearing/Vision screen Hearing Screening - Comments:: Pt says his hearing is worsening: declines hearing test Vision Screening - Comments:: Pt says his vision is good in lft eye;has lazy eye on rt side   Goals Addressed             This Visit's Progress    Patient Stated   Not on track    10/12/23-Keep DM in control.       Depression Screen     10/12/2023    8:20 AM 09/09/2022   10:35 AM 09/07/2022   12:23 PM 12/16/2021   12:54 PM 12/09/2020    9:03 AM 01/18/2017    9:19 AM 01/12/2017   11:46 AM  PHQ 2/9 Scores  PHQ - 2 Score 0 0 0 0 0 0 0  PHQ- 9 Score    2 3      Fall Risk     10/12/2023    8:17 AM 08/26/2023    8:39 AM 03/25/2023    2:45 PM 12/28/2022    3:20 PM 09/09/2022   10:30 AM  Fall Risk   Falls in the past year? 0 0 0 0 0  Number falls in past yr: 0 0 0 0 0  Injury with Fall? 0 0 0 0 0  Risk for fall due to :  No Fall Risks No Fall Risks No Fall Risks No Fall Risks No Fall Risks  Follow up Education provided;Falls prevention discussed Falls evaluation completed Falls evaluation completed Falls evaluation completed Falls prevention discussed;Falls evaluation completed    MEDICARE RISK AT HOME:  Medicare Risk at Home Any stairs in or around the home?: No If so, are there any without handrails?: No Home free of loose throw rugs in  walkways, pet beds, electrical cords, etc?: Yes Adequate lighting in your home to reduce risk of falls?: Yes Life alert?: No Use of a cane, walker or w/c?: No Grab bars in the bathroom?: No Shower chair or bench in shower?: No Elevated toilet seat or a handicapped toilet?: Yes  TIMED UP AND GO:  Was the test performed?  No  Cognitive Function: 6CIT completed        10/12/2023    8:27 AM 09/09/2022   10:37 AM  6CIT Screen  What Year? 0 points 0 points  What month? 0 points 0 points  What time? 0 points 0 points  Count back from 20 0 points 0 points  Months in reverse 0 points 0 points  Repeat phrase 2 points 4 points  Total Score 2 points 4 points    Immunizations Immunization History  Administered Date(s) Administered   Hepatitis A, Adult 03/25/2004   Influenza Whole 05/13/2010   PFIZER(Purple Top)SARS-COV-2 Vaccination 09/09/2019, 10/02/2019, 07/24/2020   Pneumococcal-Unspecified 10/18/2014   Td (Adult),5 Lf Tetanus Toxid, Preservative Free 03/25/2004   Tdap 09/07/2013    Screening Tests Health Maintenance  Topic Date Due   Pneumonia Vaccine 81+ Years old (1 of 2 - PCV) 12/28/1960   Colonoscopy  09/29/2019   DTaP/Tdap/Td (2 - Td or Tdap) 09/08/2023   INFLUENZA VACCINE  10/17/2023 (Originally 02/17/2023)   Zoster Vaccines- Shingrix (1 of 2) 11/23/2023 (Originally 12/28/2004)   HEMOGLOBIN A1C  02/23/2024   FOOT EXAM  04/14/2024   Diabetic kidney evaluation - Urine ACR  08/25/2024   OPHTHALMOLOGY EXAM  09/27/2024   Diabetic kidney evaluation - eGFR measurement  09/30/2024   Medicare Annual Wellness (AWV)  10/11/2024   Hepatitis C Screening  Completed   HPV VACCINES  Aged Out   COVID-19 Vaccine  Discontinued    Health Maintenance  Health Maintenance Due  Topic Date Due   Pneumonia Vaccine 70+ Years old (1 of 2 - PCV) 12/28/1960   Colonoscopy  09/29/2019   DTaP/Tdap/Td (2 - Td or Tdap) 09/08/2023   Health Maintenance Items Addressed: Flu Vaccine: Patient  declined flu vaccine Due, Education has been provided regarding the importance of this vaccine. Advised may receive this vaccine at local pharmacy or Health Dept. Aware to provide a copy of the vaccination record if obtained from local pharmacy or Health Dept. Verbalized acceptance and understanding.   Colorectal Cancer Screening: Patient declined colorectal cancer screening right now  Additional Screening:  Vision Screening: Recommended annual ophthalmology exams for early detection of glaucoma and other disorders of the eye.  Dental Screening: Recommended annual dental exams for proper oral hygiene  Community Resource Referral / Chronic Care Management: CRR required this visit?  No   CCM required this visit?  No     Plan:     I have personally reviewed and noted the following in the patient's chart:   Medical and social history Use of alcohol, tobacco or illicit drugs  Current medications and supplements including opioid prescriptions. Patient is not currently taking opioid prescriptions. Functional ability and status Nutritional status Physical  activity Advanced directives List of other physicians Hospitalizations, surgeries, and ER visits in previous 12 months Vitals Screenings to include cognitive, depression, and falls Referrals and appointments  In addition, I have reviewed and discussed with patient certain preventive protocols, quality metrics, and best practice recommendations. A written personalized care plan for preventive services as well as general preventive health recommendations were provided to patient.     Sue Lush, LPN   1/61/0960   After Visit Summary: (Declined) Due to this being a telephonic visit, with patients personalized plan was offered to patient but patient Declined AVS at this time   Notes: Please refer to Routing Comments.

## 2023-10-13 ENCOUNTER — Encounter: Payer: Self-pay | Admitting: Primary Care

## 2023-10-13 ENCOUNTER — Ambulatory Visit (INDEPENDENT_AMBULATORY_CARE_PROVIDER_SITE_OTHER): Admitting: Primary Care

## 2023-10-13 VITALS — BP 158/82 | HR 105 | Temp 97.7°F | Ht 71.0 in | Wt 196.0 lb

## 2023-10-13 DIAGNOSIS — Z794 Long term (current) use of insulin: Secondary | ICD-10-CM | POA: Diagnosis not present

## 2023-10-13 DIAGNOSIS — E1165 Type 2 diabetes mellitus with hyperglycemia: Secondary | ICD-10-CM

## 2023-10-13 DIAGNOSIS — E119 Type 2 diabetes mellitus without complications: Secondary | ICD-10-CM

## 2023-10-13 MED ORDER — OZEMPIC (0.25 OR 0.5 MG/DOSE) 2 MG/3ML ~~LOC~~ SOPN: PEN_INJECTOR | SUBCUTANEOUS | 0 refills | Status: DC

## 2023-10-13 MED ORDER — LANTUS SOLOSTAR 100 UNIT/ML ~~LOC~~ SOPN: 50.0000 [IU] | PEN_INJECTOR | Freq: Every day | SUBCUTANEOUS | Status: DC

## 2023-10-13 NOTE — Progress Notes (Signed)
 Subjective:    Patient ID: Christian Petty, male    DOB: 09-04-1955, 68 y.o.   MRN: [id number]  HPI  Christian Petty is a very pleasant 68 y.o. male with a history of type 2 diabetes, hypertrophic obstructive cardiomyopathy, BPH, tobacco use, hyperlipidemia, chronic back pain, hypertension who presents today for ED follow-up and evaluation of diabetes.  He presented to [facility] ED on 10/01/2023 for evaluation of hyperglycemia despite compliance to glipizide, Jardiance, metformin, Lantus.  Urinalysis revealed glucose urea, glucose reading was 511 initially.  He was treated with 10 units of insulin and 1 L of lactated Ringer's solution.  Glucose reduced to 88 so he was discharged home with recommendation for PCP follow-up.  Previously managed on Ozempic, but this caused GERD.  A1c was 10.9 in February 2025, increased from 7.7 in June 2024. He is currently injecting 46 units of Lantus. He would like to resume Ozempic as he did well on this regimen previously. He is compliant to metformin ER 1000 mg daily, Glipizide Xl 10 mg daily, and Jardiance 10 mg daily.   He is checking his glucose continuously and is getting readings of:  AM fasting: 250 Late afternoon: 250-300 Bedtime: high 100s    Review of Systems  Respiratory:  Negative for shortness of breath.   Cardiovascular:  Negative for chest pain.  Gastrointestinal:  Negative for constipation and diarrhea.  Neurological:  Positive for numbness.         Past Medical History:  Diagnosis Date   Allergy    Arthritis    BPH (benign prostatic hyperplasia)    Chronic back pain    Dizziness 01/28/2021   HOMOCYSTINEMIA 11/21/2007   Qualifier: Diagnosis of   By: Drue Novel MD, Nolon Rod.     Replacing diagnoses that were inactivated after the 10/18/22 regulatory import     HTN (hypertension)    states under control with med., has been on med. x 2-3 yr.   Hyperlipidemia, mild    Insulin dependent diabetes mellitus    Skin abrasion 04/15/2023   Trigger thumb of left  hand 10/2017   Trigger thumb of right hand 10/04/2017    Social History   Socioeconomic History   Marital status: Married    Spouse name: Not on file   Number of children: 2   Years of education: Not on file   Highest education level: Not on file  Occupational History   Occupation: truck driver   Tobacco Use   Smoking status: Former    Current packs/day: 0.00    Types: Cigarettes    Quit date: 11/16/2010    Years since quitting: 12.9   Smokeless tobacco: Never  Vaping Use   Vaping status: Never Used  Substance and Sexual Activity   Alcohol use: Yes    Comment: occasionally   Drug use: No   Sexual activity: Not on file  Other Topics Concern   Not on file  Social History Narrative   Married.   2 children, 2 grandchildren.   Works for himself as a Naval architect.   Social Drivers of Corporate investment banker Strain: Low Risk  (10/12/2023)   Overall Financial Resource Strain (CARDIA)    Difficulty of Paying Living Expenses: Not hard at all  Food Insecurity: No Food Insecurity (10/12/2023)   Hunger Vital Sign    Worried About Running Out of Food in the Last Year: Never true    Ran Out of Food in the Last Year: Never true  Transportation  Needs: No Transportation Needs (10/12/2023)   PRAPARE - Administrator, Civil Service (Medical): No    Lack of Transportation (Non-Medical): No  Physical Activity: Inactive (10/12/2023)   Exercise Vital Sign    Days of Exercise per Week: 0 days    Minutes of Exercise per Session: 0 min  Stress: No Stress Concern Present (10/12/2023)   [name] of Occupational Health - Occupational Stress Questionnaire    Feeling of Stress : Not at all  Social Connections: Moderately Isolated (10/12/2023)   Social Connection and Isolation Panel [NHANES]    Frequency of Communication with Friends and Family: More than three times a week    Frequency of Social Gatherings with Friends and Family: Never    Attends Religious Services: Never     Database administrator or Organizations: No    Attends Banker Meetings: Never    Marital Status: Married  Catering manager Violence: Not At Risk (10/12/2023)   Humiliation, Afraid, Rape, and Kick questionnaire    Fear of Current or Ex-Partner: No    Emotionally Abused: No    Physically Abused: No    Sexually Abused: No    Past Surgical History:  Procedure Laterality Date   COLONOSCOPY  06/19/2006   COLONOSCOPY WITH PROPOFOL  09/28/2016   INGUINAL HERNIA REPAIR     LUMBAR LAMINECTOMY     x 2   POLYPECTOMY     TRIGGER FINGER RELEASE Right 10/04/2017   Procedure: RELEASE TRIGGER THUMB, RIGHT;  Surgeon: Teryl Lucy, MD;  Location: [facility];  Service: Orthopedics;  Laterality: Right;    Family History  Problem Relation Age of Onset   Diabetes Father    Breast cancer Mother    Stroke Other 1       F   Colon cancer Neg Hx    Colon polyps Neg Hx    Esophageal cancer Neg Hx    Rectal cancer Neg Hx    Stomach cancer Neg Hx     No Known Allergies  Current Outpatient Medications on File Prior to Visit  Medication Sig Dispense Refill   atorvastatin (LIPITOR) 40 MG tablet Take 1 tablet (40 mg total) by mouth daily. 90 tablet 1   Continuous Glucose Sensor (FREESTYLE LIBRE 3 PLUS SENSOR) MISC Use to check blood sugar continuously. Change sensor every 15 days. 6 each 1   CONTOUR NEXT TEST test strip Use as instructed 100 strip 2   empagliflozin (JARDIANCE) 10 MG TABS tablet Take 1 tablet (10 mg total) by mouth daily. for diabetes. 90 tablet 0   famotidine (PEPCID) 20 MG tablet Take 1 tablet (20 mg total) by mouth daily. for heartburn. 90 tablet 2   fenofibrate 160 MG tablet TAKE 1 TABLET(160 MG) BY MOUTH DAILY FOR CHOLESTEROL 90 tablet 2   glipiZIDE (GLUCOTROL XL) 10 MG 24 hr tablet Take 1 tablet (10 mg total) by mouth daily with breakfast. for diabetes. 90 tablet 0   Insulin Pen Needle (CAREFINE PEN NEEDLES) 31G X 8 MM MISC Use with insulin once  nightly at bedtime. 100 each 3   Lancets MISC Use as instructed to test blood sugar daily 100 each 0   losartan (COZAAR) 100 MG tablet TAKE 1 TABLET BY MOUTH EVERY DAY FOR BLOOD PRESSURE 90 tablet 0   metFORMIN (GLUCOPHAGE-XR) 500 MG 24 hr tablet Take 2 tablets (1,000 mg total) by mouth daily with breakfast. for diabetes. 180 tablet 0   tamsulosin (FLOMAX) 0.4 MG  CAPS capsule TAKE 1 CAPSULE BY MOUTH ONCE DAILY AFTER BREAKFAST FOR URINE FLOW 90 capsule 0   No current facility-administered medications on file prior to visit.    BP (!) 158/82   Pulse (!) 105   Temp 97.7 F (36.5 C) (Temporal)   Ht 5\' 11"  (1.803 m)   Wt 196 lb (88.9 kg)   SpO2 97%   BMI 27.34 kg/m  Objective:   Physical Exam Cardiovascular:     Rate and Rhythm: Normal rate and regular rhythm.  Pulmonary:     Effort: Pulmonary effort is normal.     Breath sounds: Normal breath sounds.  Musculoskeletal:     Cervical back: Neck supple.  Skin:    General: Skin is warm and dry.  Neurological:     Mental Status: He is alert and oriented to person, place, and time.  Psychiatric:        Mood and Affect: Mood normal.           Assessment & Plan:  Type 2 diabetes mellitus with hyperglycemia, with long-term current use of insulin (HCC) Assessment & Plan: Deteriorated. Reviewed ED notes, labs.  Increase Lantus to 50 units daily. Resume Ozempic at 0.25 mg x 4 weeks, then increase to 0.5 mg weekly thereafter. We discussed common triggers for GERD and to avoid triggers if possible  Continue Jardiance 10 mg daily, glipizide XL 10 mg daily, metformin ER 1000 mg daily.  He will call with an update in 2 weeks regarding blood sugars.  Close follow-up in 6 weeks.  Orders: -     Ozempic (0.25 or 0.5 MG/DOSE); Inject 0.25 mg into the skin once weekly for 4 weeks, then increase to 0.5 mg once weekly thereafter for diabetes.  Dispense: 9 mL; Refill: 0  Type 2 diabetes mellitus without complication, without long-term  current use of insulin (HCC) -     Lantus SoloStar; Inject 50 Units into the skin at bedtime.        Doreene Nest, NP

## 2023-10-13 NOTE — Assessment & Plan Note (Addendum)
 Deteriorated. Reviewed ED notes, labs.  Increase Lantus to 50 units daily. Resume Ozempic at 0.25 mg x 4 weeks, then increase to 0.5 mg weekly thereafter. We discussed common triggers for GERD and to avoid triggers if possible  Continue Jardiance 10 mg daily, glipizide XL 10 mg daily, metformin ER 1000 mg daily.  He will call with an update in 2 weeks regarding blood sugars.  Close follow-up in 6 weeks.

## 2023-10-13 NOTE — Patient Instructions (Signed)
 Start semaglutide (Ozempic) for diabetes. Start by injecting 0.25 mg into the skin once weekly for 4 weeks, then increase to 0.5 mg once weekly thereafter.   Continue glipizide, metformin, Jardiance every day for diabetes.  Increase your Lantus insulin 50 units daily.  Schedule a follow-up visit for on or after Nov 23, 2023.  It was a pleasure to see you today!

## 2023-11-07 ENCOUNTER — Other Ambulatory Visit: Payer: Self-pay

## 2023-11-07 DIAGNOSIS — E782 Mixed hyperlipidemia: Secondary | ICD-10-CM

## 2023-11-07 MED ORDER — ATORVASTATIN CALCIUM 40 MG PO TABS: 40.0000 mg | ORAL_TABLET | Freq: Every day | ORAL | 2 refills | Status: AC

## 2023-11-18 ENCOUNTER — Other Ambulatory Visit: Payer: Self-pay | Admitting: Primary Care

## 2023-11-18 DIAGNOSIS — I10 Essential (primary) hypertension: Secondary | ICD-10-CM

## 2023-12-01 ENCOUNTER — Other Ambulatory Visit: Payer: Self-pay

## 2023-12-01 DIAGNOSIS — E1165 Type 2 diabetes mellitus with hyperglycemia: Secondary | ICD-10-CM

## 2023-12-05 ENCOUNTER — Telehealth: Payer: Self-pay

## 2023-12-05 MED ORDER — EMPAGLIFLOZIN 10 MG PO TABS: 10.0000 mg | ORAL_TABLET | Freq: Every day | ORAL | 0 refills | Status: DC

## 2023-12-05 NOTE — Telephone Encounter (Signed)
Patient is due for diabetes follow up, this will be required prior to any further refills.  Please schedule, thank you!   

## 2023-12-05 NOTE — Telephone Encounter (Signed)
 error

## 2023-12-06 NOTE — Telephone Encounter (Signed)
 Spoke to pt, scheduled ov for 12/09/23

## 2023-12-09 ENCOUNTER — Ambulatory Visit: Admitting: Primary Care

## 2023-12-15 ENCOUNTER — Ambulatory Visit: Payer: Self-pay | Admitting: Primary Care

## 2023-12-15 ENCOUNTER — Ambulatory Visit (INDEPENDENT_AMBULATORY_CARE_PROVIDER_SITE_OTHER): Admitting: Primary Care

## 2023-12-15 ENCOUNTER — Encounter: Payer: Self-pay | Admitting: Primary Care

## 2023-12-15 VITALS — BP 132/82 | HR 111 | Temp 97.8°F | Ht 71.0 in | Wt 180.0 lb

## 2023-12-15 DIAGNOSIS — Z794 Long term (current) use of insulin: Secondary | ICD-10-CM | POA: Diagnosis not present

## 2023-12-15 DIAGNOSIS — E1165 Type 2 diabetes mellitus with hyperglycemia: Secondary | ICD-10-CM

## 2023-12-15 LAB — POCT GLYCOSYLATED HEMOGLOBIN (HGB A1C): Hemoglobin A1C: 8.7 % — AB (ref 4.0–5.6)

## 2023-12-15 MED ORDER — METFORMIN HCL ER 500 MG PO TB24: 1000.0000 mg | ORAL_TABLET | Freq: Every day | ORAL | 1 refills | Status: DC

## 2023-12-15 MED ORDER — GLIPIZIDE ER 10 MG PO TB24: 10.0000 mg | ORAL_TABLET | Freq: Every day | ORAL | 1 refills | Status: DC

## 2023-12-15 NOTE — Assessment & Plan Note (Signed)
 Improved with A1C of 8.7, not at goal.  Remain off Lantus  due to hypoglycemic episodes. Discussed to STOP snacking during the night, especially on cookies and milk.  Continue Jardiance  10 mg daily, glipizide  XL 10 mg daily, metformin  ER 1000 mg daily, Ozempic  0.5 mg weekly. Consider reduction in glipizide  XL to 5 mg daily if he experiences more hypoglycemic episodes.  For now his glucose levels appear stable over the last 2 weeks per freestyle libre report which was reviewed today.  Follow-up in 3 months.

## 2023-12-15 NOTE — Patient Instructions (Signed)
 Snacking on cookies and milk during the night.  Remain off of Lantus  insulin .  Notify me if you notice more drops in your sugar, especially below 60.  Please schedule a follow up visit for 3 months.  It was a pleasure to see you today!

## 2023-12-15 NOTE — Progress Notes (Signed)
 Subjective:    Patient ID: Christian Petty, male    DOB: 06/18/1956, 68 y.o.   MRN: [id number]  HPI  Christian Petty is a very pleasant 68 y.o. male with a history of type 2 diabetes, cardiomyopathy, BPH, tobacco use, hyperlipidemia who presents today for follow-up of diabetes.  Current medications include: Jardiance  10 mg daily, glipizide  XL 10 mg daily, Lantus  50 units at bedtime, metformin  ER 1000 mg daily, Ozempic  0.5 mg weekly.   He stopped Lantus  insulin  about 30 days ago due to hypoglycemia with readings in the 50s. He gets up every night and eats a snack, cookies and milk, no reason really.   He has a freestyle libre CGM.  Time in range over the last 14 days equals 59% Very high 7% High 27% Low 7% Very low 0%  Last A1C: 10.9 in February 2025, 8.7 today Last Eye Exam: Up-to-date Last Foot Exam: Up-to-date Pneumonia Vaccination: 2016 Urine Microalbumin: Up-to-date Statin: Atorvastatin   Dietary changes since last visit: Reduced appetite due to Ozempic . Fast food. Cookies and milk during the night, every night.    Exercise: None.   Wt Readings from Last 3 Encounters:  12/15/23 180 lb (81.6 kg)  10/13/23 196 lb (88.9 kg)  10/12/23 200 lb (90.7 kg)       Review of Systems  Respiratory:  Negative for shortness of breath.   Cardiovascular:  Negative for chest pain.  Endocrine: Positive for polydipsia and polyuria.  Neurological:  Negative for numbness.         Past Medical History:  Diagnosis Date   Allergy    Arthritis    BPH (benign prostatic hyperplasia)    Chronic back pain    Dizziness 01/28/2021   HOMOCYSTINEMIA 11/21/2007   Qualifier: Diagnosis of   By: Christian Banks MD, Christian Petty.     Replacing diagnoses that were inactivated after the 10/18/22 regulatory import     HTN (hypertension)    states under control with med., has been on med. x 2-3 yr.   Hyperlipidemia, mild    Insulin  dependent diabetes mellitus    Skin abrasion 04/15/2023   Trigger thumb of left hand 10/2017    Trigger thumb of right hand 10/04/2017    Social History   Socioeconomic History   Marital status: Married    Spouse name: Not on file   Number of children: 2   Years of education: Not on file   Highest education level: Not on file  Occupational History   Occupation: truck driver   Tobacco Use   Smoking status: Former    Current packs/day: 0.00    Types: Cigarettes    Quit date: 11/16/2010    Years since quitting: 13.0   Smokeless tobacco: Never  Vaping Use   Vaping status: Never Used  Substance and Sexual Activity   Alcohol use: Yes    Comment: occasionally   Drug use: No   Sexual activity: Not on file  Other Topics Concern   Not on file  Social History Narrative   Married.   2 children, 2 grandchildren.   Works for himself as a Naval architect.   Social Drivers of Corporate investment banker Strain: Low Risk  (10/12/2023)   Overall Financial Resource Strain (CARDIA)    Difficulty of Paying Living Expenses: Not hard at all  Food Insecurity: No Food Insecurity (10/12/2023)   Hunger Vital Sign    Worried About Running Out of Food in the Last Year: Never true  Ran Out of Food in the Last Year: Never true  Transportation Needs: No Transportation Needs (10/12/2023)   PRAPARE - Administrator, Civil Service (Medical): No    Lack of Transportation (Non-Medical): No  Physical Activity: Inactive (10/12/2023)   Exercise Vital Sign    Days of Exercise per Week: 0 days    Minutes of Exercise per Session: 0 min  Stress: No Stress Concern Present (10/12/2023)   Christian Petty of Occupational Health - Occupational Stress Questionnaire    Feeling of Stress : Not at all  Social Connections: Moderately Isolated (10/12/2023)   Social Connection and Isolation Panel [NHANES]    Frequency of Communication with Friends and Family: More than three times a week    Frequency of Social Gatherings with Friends and Family: Never    Attends Religious Services: Never    Automotive engineer or Organizations: No    Attends Banker Meetings: Never    Marital Status: Married  Catering manager Violence: Not At Risk (10/12/2023)   Humiliation, Afraid, Rape, and Kick questionnaire    Fear of Current or Ex-Partner: No    Emotionally Abused: No    Physically Abused: No    Sexually Abused: No    Past Surgical History:  Procedure Laterality Date   COLONOSCOPY  06/19/2006   COLONOSCOPY WITH PROPOFOL   09/28/2016   INGUINAL HERNIA REPAIR     LUMBAR LAMINECTOMY     x 2   POLYPECTOMY     TRIGGER FINGER RELEASE Right 10/04/2017   Procedure: RELEASE TRIGGER THUMB, RIGHT;  Surgeon: Osa Blase, MD;  Location: [facility];  Service: Orthopedics;  Laterality: Right;    Family History  Problem Relation Age of Onset   Diabetes Father    Breast cancer Mother    Stroke Other 20       F   Colon cancer Neg Hx    Colon polyps Neg Hx    Esophageal cancer Neg Hx    Rectal cancer Neg Hx    Stomach cancer Neg Hx     No Known Allergies  Current Outpatient Medications on File Prior to Visit  Medication Sig Dispense Refill   atorvastatin  (LIPITOR) 40 MG tablet Take 1 tablet (40 mg total) by mouth daily. 90 tablet 2   Continuous Glucose Sensor (FREESTYLE LIBRE 3 PLUS SENSOR) MISC Use to check blood sugar continuously. Change sensor every 15 days. 6 each 1   CONTOUR NEXT TEST test strip Use as instructed 100 strip 2   empagliflozin  (JARDIANCE ) 10 MG TABS tablet Take 1 tablet (10 mg total) by mouth daily. for diabetes. 30 tablet 0   famotidine  (PEPCID ) 20 MG tablet Take 1 tablet (20 mg total) by mouth daily. for heartburn. 90 tablet 2   fenofibrate  160 MG tablet TAKE 1 TABLET(160 MG) BY MOUTH DAILY FOR CHOLESTEROL 90 tablet 2   Insulin  Pen Needle (CAREFINE PEN NEEDLES) 31G X 8 MM MISC Use with insulin  once nightly at bedtime. 100 each 3   Lancets MISC Use as instructed to test blood sugar daily 100 each 0   losartan  (COZAAR ) 100 MG tablet TAKE 1  TABLET BY MOUTH EVERY DAY FOR BLOOD PRESSURE 90 tablet 2   Semaglutide ,0.25 or 0.5MG /DOS, (OZEMPIC , 0.25 OR 0.5 MG/DOSE,) 2 MG/3ML SOPN Inject 0.25 mg into the skin once weekly for 4 weeks, then increase to 0.5 mg once weekly thereafter for diabetes. 9 mL 0   tamsulosin  (FLOMAX ) 0.4 MG CAPS capsule  TAKE 1 CAPSULE BY MOUTH ONCE DAILY AFTER BREAKFAST FOR URINE FLOW 90 capsule 0   No current facility-administered medications on file prior to visit.    BP 132/82   Pulse (!) 111   Temp 97.8 F (36.6 C) (Temporal)   Ht 5\' 11"  (1.803 m)   Wt 180 lb (81.6 kg)   SpO2 98%   BMI 25.10 kg/m  Objective:   Physical Exam Cardiovascular:     Rate and Rhythm: Normal rate and regular rhythm.  Pulmonary:     Effort: Pulmonary effort is normal.     Breath sounds: Normal breath sounds.  Musculoskeletal:     Cervical back: Neck supple.  Skin:    General: Skin is warm and dry.  Neurological:     Mental Status: He is alert and oriented to person, place, and time.  Psychiatric:        Mood and Affect: Mood normal.           Assessment & Plan:  Type 2 diabetes mellitus with hyperglycemia, with long-term current use of insulin  (HCC) Assessment & Plan: Improved with A1C of 8.7, not at goal.  Remain off Lantus  due to hypoglycemic episodes. Discussed to STOP snacking during the night, especially on cookies and milk.  Continue Jardiance  10 mg daily, glipizide  XL 10 mg daily, metformin  ER 1000 mg daily, Ozempic  0.5 mg weekly. Consider reduction in glipizide  XL to 5 mg daily if he experiences more hypoglycemic episodes.  For now his glucose levels appear stable over the last 2 weeks per freestyle libre report which was reviewed today.  Follow-up in 3 months.  Orders: -     POCT glycosylated hemoglobin (Hb A1C) -     glipiZIDE  ER; Take 1 tablet (10 mg total) by mouth daily with breakfast. for diabetes.  Dispense: 90 tablet; Refill: 1 -     metFORMIN  HCl ER; Take 2 tablets (1,000 mg total) by  mouth daily with breakfast. for diabetes.  Dispense: 180 tablet; Refill: 1        Gabriel John, NP

## 2024-01-09 ENCOUNTER — Other Ambulatory Visit: Payer: Self-pay

## 2024-01-09 DIAGNOSIS — E1165 Type 2 diabetes mellitus with hyperglycemia: Secondary | ICD-10-CM

## 2024-01-09 MED ORDER — EMPAGLIFLOZIN 10 MG PO TABS: 10.0000 mg | ORAL_TABLET | Freq: Every day | ORAL | 1 refills | Status: DC

## 2024-01-24 ENCOUNTER — Other Ambulatory Visit: Payer: Self-pay

## 2024-01-24 DIAGNOSIS — N400 Enlarged prostate without lower urinary tract symptoms: Secondary | ICD-10-CM

## 2024-01-24 MED ORDER — TAMSULOSIN HCL 0.4 MG PO CAPS: 0.4000 mg | ORAL_CAPSULE | Freq: Every day | ORAL | 1 refills | Status: AC

## 2024-03-07 ENCOUNTER — Other Ambulatory Visit: Payer: Self-pay

## 2024-03-07 DIAGNOSIS — E1165 Type 2 diabetes mellitus with hyperglycemia: Secondary | ICD-10-CM

## 2024-03-07 MED ORDER — FREESTYLE LIBRE 3 PLUS SENSOR MISC: 1 refills | Status: DC

## 2024-04-03 ENCOUNTER — Ambulatory Visit (INDEPENDENT_AMBULATORY_CARE_PROVIDER_SITE_OTHER): Admitting: Primary Care

## 2024-04-03 ENCOUNTER — Encounter: Payer: Self-pay | Admitting: Primary Care

## 2024-04-03 ENCOUNTER — Ambulatory Visit: Payer: Self-pay | Admitting: Primary Care

## 2024-04-03 VITALS — BP 134/66 | HR 106 | Temp 97.8°F | Ht 71.0 in | Wt 184.2 lb

## 2024-04-03 DIAGNOSIS — Z23 Encounter for immunization: Secondary | ICD-10-CM | POA: Diagnosis not present

## 2024-04-03 DIAGNOSIS — Z794 Long term (current) use of insulin: Secondary | ICD-10-CM | POA: Diagnosis not present

## 2024-04-03 DIAGNOSIS — E1165 Type 2 diabetes mellitus with hyperglycemia: Secondary | ICD-10-CM | POA: Diagnosis not present

## 2024-04-03 LAB — POCT GLYCOSYLATED HEMOGLOBIN (HGB A1C): Hemoglobin A1C: 8.5 % — AB (ref 4.0–5.6)

## 2024-04-03 MED ORDER — SEMAGLUTIDE (2 MG/DOSE) 8 MG/3ML ~~LOC~~ SOPN: 2.0000 mg | PEN_INJECTOR | SUBCUTANEOUS | 1 refills | Status: AC

## 2024-04-03 NOTE — Progress Notes (Signed)
 Subjective:    Patient ID: Christian Petty, male    DOB: 05-26-1956, 68 y.o.   MRN: [id number]  Christian Petty is a very pleasant 68 y.o. male with a history of type 2 diabetes, hypertension, cardiomyopathy, hyperlipidemia, tobacco use who presents today for follow up of diabetes.  Current medications include: Glipizide  XL 10 mg daily, metformin  ER 1000 mg daily, Jardiance  10 mg daily, Ozempic  1 mg weekly.  He is checking his blood glucose continuously and is getting readings of mid 200s consistently. He has CGM.   Last A1C: 8.7 in May 2025, 8.5 today  Last Eye Exam: UTD Last Foot Exam: UTD Pneumonia Vaccination: 2016 Urine Microalbumin: UTD Statin: atorvastatin    Dietary changes since last visit: Tries to increase protein intake. He most eats at home. No fried food. Cookies and snack cake at bedtime, milk everyday.    Exercise: None   BP Readings from Last 3 Encounters:  04/03/24 134/66  12/15/23 132/82  10/13/23 (!) 158/82       Review of Systems  Eyes:  Negative for visual disturbance.  Respiratory:  Negative for shortness of breath.   Cardiovascular:  Negative for chest pain.  Gastrointestinal:  Negative for abdominal pain and nausea.  Neurological:  Negative for numbness.         Past Medical History:  Diagnosis Date   Allergy    Arthritis    BPH (benign prostatic hyperplasia)    Chronic back pain    Dizziness 01/28/2021   HOMOCYSTINEMIA 11/21/2007   Qualifier: Diagnosis of   By: Amon MD, Aloysius BRAVO.     Replacing diagnoses that were inactivated after the 10/18/22 regulatory import     HTN (hypertension)    states under control with med., has been on med. x 2-3 yr.   Hyperlipidemia, mild    Insulin  dependent diabetes mellitus    Skin abrasion 04/15/2023   Trigger thumb of left hand 10/2017   Trigger thumb of right hand 10/04/2017    Social History   Socioeconomic History   Marital status: Married    Spouse name: Not on file   Number of children: 2   Years of  education: Not on file   Highest education level: Not on file  Occupational History   Occupation: truck driver   Tobacco Use   Smoking status: Former    Current packs/day: 0.00    Types: Cigarettes    Quit date: 11/16/2010    Years since quitting: 13.3   Smokeless tobacco: Never  Vaping Use   Vaping status: Never Used  Substance and Sexual Activity   Alcohol use: Yes    Comment: occasionally   Drug use: No   Sexual activity: Not on file  Other Topics Concern   Not on file  Social History Narrative   Married.   2 children, 2 grandchildren.   Works for himself as a Naval architect.   Social Drivers of Corporate investment banker Strain: Low Risk  (10/12/2023)   Overall Financial Resource Strain (CARDIA)    Difficulty of Paying Living Expenses: Not hard at all  Food Insecurity: No Food Insecurity (10/12/2023)   Hunger Vital Sign    Worried About Running Out of Food in the Last Year: Never true    Ran Out of Food in the Last Year: Never true  Transportation Needs: No Transportation Needs (10/12/2023)   PRAPARE - Administrator, Civil Service (Medical): No    Lack of Transportation (Non-Medical): No  Physical Activity: Inactive (10/12/2023)   Exercise Vital Sign    Days of Exercise per Week: 0 days    Minutes of Exercise per Session: 0 min  Stress: No Stress Concern Present (10/12/2023)   [name] of Occupational Health - Occupational Stress Questionnaire    Feeling of Stress : Not at all  Social Connections: Moderately Isolated (10/12/2023)   Social Connection and Isolation Panel    Frequency of Communication with Friends and Family: More than three times a week    Frequency of Social Gatherings with Friends and Family: Never    Attends Religious Services: Never    Database administrator or Organizations: No    Attends Banker Meetings: Never    Marital Status: Married  Catering manager Violence: Not At Risk (10/12/2023)   Humiliation, Afraid,  Rape, and Kick questionnaire    Fear of Current or Ex-Partner: No    Emotionally Abused: No    Physically Abused: No    Sexually Abused: No    Past Surgical History:  Procedure Laterality Date   COLONOSCOPY  06/19/2006   COLONOSCOPY WITH PROPOFOL   09/28/2016   INGUINAL HERNIA REPAIR     LUMBAR LAMINECTOMY     x 2   POLYPECTOMY     TRIGGER FINGER RELEASE Right 10/04/2017   Procedure: RELEASE TRIGGER THUMB, RIGHT;  Surgeon: Josefina Chew, MD;  Location: [facility];  Service: Orthopedics;  Laterality: Right;    Family History  Problem Relation Age of Onset   Diabetes Father    Breast cancer Mother    Stroke Other 69       F   Colon cancer Neg Hx    Colon polyps Neg Hx    Esophageal cancer Neg Hx    Rectal cancer Neg Hx    Stomach cancer Neg Hx     No Known Allergies  Current Outpatient Medications on File Prior to Visit  Medication Sig Dispense Refill   atorvastatin  (LIPITOR) 40 MG tablet Take 1 tablet (40 mg total) by mouth daily. 90 tablet 2   Continuous Glucose Sensor (FREESTYLE LIBRE 3 PLUS SENSOR) MISC Use to check blood sugar continuously. Change sensor every 15 days. 6 each 1   CONTOUR NEXT TEST test strip Use as instructed 100 strip 2   empagliflozin  (JARDIANCE ) 10 MG TABS tablet Take 1 tablet (10 mg total) by mouth daily. for diabetes. 90 tablet 1   famotidine  (PEPCID ) 20 MG tablet Take 1 tablet (20 mg total) by mouth daily. for heartburn. 90 tablet 2   fenofibrate  160 MG tablet TAKE 1 TABLET(160 MG) BY MOUTH DAILY FOR CHOLESTEROL 90 tablet 2   glipiZIDE  (GLUCOTROL  XL) 10 MG 24 hr tablet Take 1 tablet (10 mg total) by mouth daily with breakfast. for diabetes. 90 tablet 1   Insulin  Pen Needle (CAREFINE PEN NEEDLES) 31G X 8 MM MISC Use with insulin  once nightly at bedtime. 100 each 3   Lancets MISC Use as instructed to test blood sugar daily 100 each 0   losartan  (COZAAR ) 100 MG tablet TAKE 1 TABLET BY MOUTH EVERY DAY FOR BLOOD PRESSURE 90 tablet 2    metFORMIN  (GLUCOPHAGE -XR) 500 MG 24 hr tablet Take 2 tablets (1,000 mg total) by mouth daily with breakfast. for diabetes. 180 tablet 1   tamsulosin  (FLOMAX ) 0.4 MG CAPS capsule Take 1 capsule (0.4 mg total) by mouth daily after breakfast. For urine flow and frequency 90 capsule 1   No current facility-administered medications on  file prior to visit.    BP 134/66   Pulse (!) 106   Temp 97.8 F (36.6 C) (Oral)   Ht 5' 11 (1.803 m)   Wt 184 lb 4 oz (83.6 kg)   SpO2 98%   BMI 25.70 kg/m  Objective:   Physical Exam Cardiovascular:     Rate and Rhythm: Normal rate and regular rhythm.  Pulmonary:     Effort: Pulmonary effort is normal.     Breath sounds: Normal breath sounds.  Musculoskeletal:     Cervical back: Neck supple.  Skin:    General: Skin is warm and dry.  Neurological:     Mental Status: He is alert and oriented to person, place, and time.  Psychiatric:        Mood and Affect: Mood normal.     Physical Exam        Assessment & Plan:  Type 2 diabetes mellitus with hyperglycemia, with long-term current use of insulin  (HCC) Assessment & Plan: Slight improvement but still above goal with A1C of 8.5 today.  Discussed to limit snack cakes and milk.  Increase Ozempic  to 2 mg weekly.  He  is currently taking the 1 mg dose from the PAP program. Continue metformin  ER 1000 mg daily, glipizide  XL 10 mg daily.  Will update pneumonia vaccine today.  Close follow-up in 3 months.  I have also asked that he update us  with glucose readings a few weeks after he begins the 2 mg dose.  Orders: -     POCT glycosylated hemoglobin (Hb A1C) -     Semaglutide  (2 MG/DOSE); Inject 2 mg as directed once a week. for diabetes.  Dispense: 12 mL; Refill: 1 -     AMB Referral VBCI Care Management    Assessment and Plan Assessment & Plan         Comer MARLA Gaskins, NP   History of Present Illness

## 2024-04-03 NOTE — Addendum Note (Signed)
 Addended by: Alfard Cochrane on: 04/03/2024 09:03 AM   Modules accepted: Orders

## 2024-04-03 NOTE — Assessment & Plan Note (Signed)
 Slight improvement but still above goal with A1C of 8.5 today.  Discussed to limit snack cakes and milk.  Increase Ozempic  to 2 mg weekly.  He  is currently taking the 1 mg dose from the PAP program. Continue metformin  ER 1000 mg daily, glipizide  XL 10 mg daily.  Will update pneumonia vaccine today.  Close follow-up in 3 months.  I have also asked that he update us  with glucose readings a few weeks after he begins the 2 mg dose.

## 2024-04-03 NOTE — Patient Instructions (Signed)
 We increased your dose of Ozempic  to 2 mg weekly.  Try to limit snack cakes and milk.  Continue all other medications as prescribed.  Please update me with blood sugar readings 2 weeks after you start the 2 mg dose of Ozempic .  Please schedule a follow up visit for 3 months.  It was a pleasure to see you today!

## 2024-04-04 ENCOUNTER — Telehealth: Payer: Self-pay

## 2024-04-04 NOTE — Progress Notes (Signed)
 Complex Care Management Note Care Guide Note  04/04/2024 Name: Christian Petty MRN: [id number] DOB: March 17, 1956   Complex Care Management Outreach Attempts: An unsuccessful telephone outreach was attempted today to offer the patient information about available complex care management services.  Follow Up Plan:  Additional outreach attempts will be made to offer the patient complex care management information and services.   Encounter Outcome:  Patient Request to Call Back  Dreama [facility]  [facility], [facility] VBCI Assistant Direct Dial: [phone]  Fax: [phone]

## 2024-04-06 NOTE — Progress Notes (Signed)
 Complex Care Management Note Care Guide Note  04/06/2024 Name: Christian Petty MRN: [id number] DOB: [birth year]   Complex Care Management Outreach Attempts: A second unsuccessful outreach was attempted today to offer the patient with information about available complex care management services.  Follow Up Plan:  Additional outreach attempts will be made to offer the patient complex care management information and services.   Encounter Outcome:  No Answer  Dreama [facility]  [facility], [facility] VBCI Assistant Direct Dial: [phone]  Fax: [phone]

## 2024-04-09 NOTE — Progress Notes (Signed)
 Complex Care Management Note Care Guide Note  04/09/2024 Name: Christian Petty MRN: [id number] DOB: 09/11/1955   Complex Care Management Outreach Attempts: A third unsuccessful outreach was attempted today to offer the patient with information about available complex care management services.  Follow Up Plan:  No further outreach attempts will be made at this time. We have been unable to contact the patient to offer or enroll patient in complex care management services.  Encounter Outcome:  No Answer  Dreama [facility]  [facility], [facility] VBCI Assistant Direct Dial: [phone]  Fax: [phone]

## 2024-05-18 ENCOUNTER — Other Ambulatory Visit (HOSPITAL_COMMUNITY): Payer: Self-pay

## 2024-05-18 ENCOUNTER — Telehealth: Payer: Self-pay

## 2024-05-18 NOTE — Telephone Encounter (Signed)
 Pharmacy Patient Advocate Encounter   Received notification from Onbase that prior authorization for [facility] 3 plus sensors is required/requested.   Insurance verification completed.   The patient is insured through [facility].   Per test claim: PA required; PA submitted to above mentioned insurance via Latent Key/confirmation #/EOC [id number] Status is pending

## 2024-05-22 ENCOUNTER — Encounter: Payer: Self-pay | Admitting: Pharmacist

## 2024-05-22 ENCOUNTER — Other Ambulatory Visit (HOSPITAL_COMMUNITY): Payer: Self-pay

## 2024-05-22 NOTE — Progress Notes (Signed)
 Pharmacy Quality Measure Review  This patient is appearing on a report for being at risk of failing the adherence measure for hypertension (ACEi/ARB) medications this calendar year.   Medication: losartan  100 mg  Last fill date: 02/12/24 for 90 day supply 1 refill remaining. Medication has not been refilled at this time.   Contacted pharmacy to facilitate refills.  11/18: Confirmed, losartan  and atorvastatin  have been refilled as of 06/01/24 x90ds.

## 2024-05-22 NOTE — Telephone Encounter (Signed)
 Pharmacy Patient Advocate Encounter  Received notification from [employer] that Prior Authorization for [facility] 3 plus sensors has been DENIED.  Full denial letter will be uploaded to the media tab. See denial reason below.    PA #/Case ID/Reference #: # [id number]

## 2024-05-23 NOTE — Telephone Encounter (Signed)
 Noted. He does not meet all of the requirements.

## 2024-06-01 NOTE — Telephone Encounter (Signed)
 Spoke with pt relaying Kate's message. Pt verbalizes understanding and will let us  know what info he gets from insurance co.

## 2024-06-01 NOTE — Telephone Encounter (Signed)
 They may be denied as he is not on insulin . I recommend that he call his insurance company to find out if this is true and to see if they will cover Dexcom instead.

## 2024-06-01 NOTE — Telephone Encounter (Unsigned)
 Copied from CRM #[id number]. Topic: Clinical - Prescription Issue >> Jun 01, 2024  2:43 PM Christian Petty ORN wrote: Reason for CRM: Continuous Glucose Sensor (FREESTYLE LIBRE 3 PLUS SENSOR) MISC Pre-auth denied but pt stated he was told by pcp to call her if there were any issues and she could call someone and have a way for it to be approved.  Pt requesting pcp callback

## 2024-06-26 ENCOUNTER — Telehealth: Payer: Self-pay

## 2024-06-26 NOTE — Telephone Encounter (Signed)
 Copied from CRM [id number]. Topic: Clinical - Prescription Issue >> Jun 26, 2024  3:55 PM Mercedes MATSU wrote: Reason for CRM: Patient is requesting a call back from NP Orval in regards to free style libre. CB: [phone].

## 2024-06-28 NOTE — Telephone Encounter (Signed)
 Spoke with pt returning his call. States he has some information he wants to share with Mallie concerning [employer]. Says he will bring to his OV tomorrow.

## 2024-06-29 ENCOUNTER — Ambulatory Visit: Admitting: Primary Care

## 2024-06-29 ENCOUNTER — Encounter: Payer: Self-pay | Admitting: Primary Care

## 2024-06-29 VITALS — BP 138/62 | HR 102 | Temp 98.2°F | Ht 71.0 in | Wt 187.5 lb

## 2024-06-29 DIAGNOSIS — Z794 Long term (current) use of insulin: Secondary | ICD-10-CM

## 2024-06-29 DIAGNOSIS — E1165 Type 2 diabetes mellitus with hyperglycemia: Secondary | ICD-10-CM

## 2024-06-29 LAB — POCT GLYCOSYLATED HEMOGLOBIN (HGB A1C): Hemoglobin A1C: 8.4 % — AB (ref 4.0–5.6)

## 2024-06-29 MED ORDER — TIRZEPATIDE 2.5 MG/0.5ML ~~LOC~~ SOAJ: 2.5000 mg | SUBCUTANEOUS | 0 refills | Status: AC

## 2024-06-29 MED ORDER — DEXCOM G7 RECEIVER DEVI: 3 refills | Status: AC

## 2024-06-29 MED ORDER — DEXCOM G7 SENSOR MISC: 3 refills | Status: AC

## 2024-06-29 NOTE — Progress Notes (Addendum)
 "  Subjective:    Patient ID: Christian Petty, male    DOB: 06/24/1956, 68 y.o.   MRN: [id number]  Christian Petty is a very pleasant 68 y.o. male with a history of type 2 diabetes, hypertension, cardiomyopathy, tobacco use, hyperlipidemia who presents today for follow-up diabetes. He would also like to discuss hand pain.  1) Type 2 Diabetes:  Current medications include: Metformin  ER 1000 mg daily, Ozempic  2 mg weekly, glipizide  XL 10 mg daily, Jardiance  10 mg daily.  He is frustrated as his freestyle libre CGM is not covered by his insurance. He relies on insulin  to control blood sugar readings and has a history of hypoglycemia <54.  He is checking his blood glucose infrequently because of this.  He does not like to prick his fingers.    Last A1C: 8.5 in September 2025, 8.4 today Last Eye Exam: Up-to-date Last Foot Exam: Due Pneumonia Vaccination: 2025 Urine Microalbumin: Up-to-date Statin: Atorvastatin   Dietary changes since last visit: He is eating a lot of fish, baked chicken and salads. He continues to eat sweets 1-2 times daily, drinks milk daily.   Exercise: None  BP Readings from Last 3 Encounters:  06/29/24 138/62  04/03/24 134/66  12/15/23 132/82    Wt Readings from Last 3 Encounters:  06/29/24 187 lb 8 oz (85 kg)  04/03/24 184 lb 4 oz (83.6 kg)  12/15/23 180 lb (81.6 kg)      Review of Systems  Respiratory:  Negative for shortness of breath.   Cardiovascular:  Negative for chest pain.  Neurological:  Negative for dizziness and numbness.         Past Medical History:  Diagnosis Date   Allergy    Arthritis    BPH (benign prostatic hyperplasia)    Chronic back pain    Dizziness 01/28/2021   HOMOCYSTINEMIA 11/21/2007   Qualifier: Diagnosis of   By: Amon MD, Aloysius BRAVO.     Replacing diagnoses that were inactivated after the 10/18/22 regulatory import     HTN (hypertension)    states under control with med., has been on med. x 2-3 yr.   Hyperlipidemia, mild    Insulin   dependent diabetes mellitus    Skin abrasion 04/15/2023   Trigger thumb of left hand 10/2017   Trigger thumb of right hand 10/04/2017    Social History   Socioeconomic History   Marital status: Married    Spouse name: Not on file   Number of children: 2   Years of education: Not on file   Highest education level: Not on file  Occupational History   Occupation: truck driver   Tobacco Use   Smoking status: Former    Current packs/day: 0.00    Types: Cigarettes    Quit date: 11/16/2010    Years since quitting: 13.7   Smokeless tobacco: Never  Vaping Use   Vaping status: Never Used  Substance and Sexual Activity   Alcohol use: Yes    Comment: occasionally   Drug use: No   Sexual activity: Not on file  Other Topics Concern   Not on file  Social History Narrative   Married.   2 children, 2 grandchildren.   Works for himself as a naval architect.   Social Drivers of Health   Tobacco Use: Medium Risk (06/29/2024)   Patient History    Smoking Tobacco Use: Former    Smokeless Tobacco Use: Never    Passive Exposure: Not on file  Financial Resource Strain: Low Risk (  10/12/2023)   Overall Financial Resource Strain (CARDIA)    Difficulty of Paying Living Expenses: Not hard at all  Food Insecurity: No Food Insecurity (10/12/2023)   Hunger Vital Sign    Worried About Running Out of Food in the Last Year: Never true    Ran Out of Food in the Last Year: Never true  Transportation Needs: No Transportation Needs (10/12/2023)   PRAPARE - Administrator, Civil Service (Medical): No    Lack of Transportation (Non-Medical): No  Physical Activity: Inactive (10/12/2023)   Exercise Vital Sign    Days of Exercise per Week: 0 days    Minutes of Exercise per Session: 0 min  Stress: No Stress Concern Present (10/12/2023)   [name] of Occupational Health - Occupational Stress Questionnaire    Feeling of Stress : Not at all  Social Connections: Moderately Isolated (10/12/2023)    Social Connection and Isolation Panel    Frequency of Communication with Friends and Family: More than three times a week    Frequency of Social Gatherings with Friends and Family: Never    Attends Religious Services: Never    Database Administrator or Organizations: No    Attends Banker Meetings: Never    Marital Status: Married  Catering Manager Violence: Not At Risk (10/12/2023)   Humiliation, Afraid, Rape, and Kick questionnaire    Fear of Current or Ex-Partner: No    Emotionally Abused: No    Physically Abused: No    Sexually Abused: No  Depression (PHQ2-9): Low Risk (06/29/2024)   Depression (PHQ2-9)    PHQ-2 Score: 0  Alcohol Screen: Low Risk (10/12/2023)   Alcohol Screen    Last Alcohol Screening Score (AUDIT): 0  Housing: Unknown (10/12/2023)   Housing Stability Vital Sign    Unable to Pay for Housing in the Last Year: No    Number of Times Moved in the Last Year: Not on file    Homeless in the Last Year: No  Utilities: Not At Risk (10/12/2023)   AHC Utilities    Threatened with loss of utilities: No  Health Literacy: Adequate Health Literacy (10/12/2023)   B1300 Health Literacy    Frequency of need for help with medical instructions: Never    Past Surgical History:  Procedure Laterality Date   COLONOSCOPY  06/19/2006   COLONOSCOPY WITH PROPOFOL   09/28/2016   INGUINAL HERNIA REPAIR     LUMBAR LAMINECTOMY     x 2   POLYPECTOMY     TRIGGER FINGER RELEASE Right 10/04/2017   Procedure: RELEASE TRIGGER THUMB, RIGHT;  Surgeon: Josefina Chew, MD;  Location: [facility];  Service: Orthopedics;  Laterality: Right;    Family History  Problem Relation Age of Onset   Diabetes Father    Breast cancer Mother    Stroke Other 40       F   Colon cancer Neg Hx    Colon polyps Neg Hx    Esophageal cancer Neg Hx    Rectal cancer Neg Hx    Stomach cancer Neg Hx     Allergies[1]  Medications Ordered Prior to Encounter[2]  BP 138/62   Pulse  (!) 102   Temp 98.2 F (36.8 C) (Oral)   Ht 5' 11 (1.803 m)   Wt 187 lb 8 oz (85 kg)   SpO2 96%   BMI 26.15 kg/m  Objective:   Physical Exam Cardiovascular:     Rate and Rhythm: Normal rate and  regular rhythm.  Pulmonary:     Effort: Pulmonary effort is normal.     Breath sounds: Normal breath sounds.  Musculoskeletal:     Cervical back: Neck supple.  Skin:    General: Skin is warm and dry.  Neurological:     Mental Status: He is alert and oriented to person, place, and time.  Psychiatric:        Mood and Affect: Mood normal.     Physical Exam        Assessment & Plan:  Type 2 diabetes mellitus with hyperglycemia, with long-term current use of insulin  (HCC) Assessment & Plan: Ultimately no improvement compared to last visit with A1c of 8.4.  We discussed that his glucose levels are largely secondary to his diet as he drinks nearly a gallon of milk every 2 days and eats sweets twice daily.  There is also no regular exercise.  Will repeat renal function today. Will switch from Ozempic  to Mounjaro  as he did well on this previously.  Start tirzepitide (Mounjaro ) for diabetes/weight loss. Start by injecting 2.5 mg into the skin once weekly for 4 weeks, then increase to 5 mg once weekly thereafter.   Continue Jardiance  10 mg daily. Consider increasing to 25 mg daily.  Await renal function. Continue glipizide  XL 10 mg daily, metformin  ER 1000 mg daily.  He declines to increase metformin  dose.  We also discussed the importance of checking glucose levels.  We will attempt Dexcom CGM to see if this will be covered under his insurance as he relies on insulin  to control glucose levels.  Follow-up in 3 months.   Orders: -     POCT glycosylated hemoglobin (Hb A1C) -     Dexcom G7 Receiver; Use to check blood sugars.  Dispense: 1 each; Refill: 3 -     Dexcom G7 Sensor; Apply every 10 days to check blood sugars.  Dispense: 3 each; Refill: 3 -     Tirzepatide ; Inject 2.5 mg  into the skin once a week. for diabetes.  Dispense: 2 mL; Refill: 0 -     Basic metabolic panel with GFR    Assessment and Plan Assessment & Plan  I personally spent a total of 25 minutes in the care of the patient today including preparing to see the patient, getting/reviewing separately obtained history, performing a medically appropriate exam/evaluation, counseling and educating, placing orders, documenting clinical information in the EHR, independently interpreting results, and coordinating care.        Kristan Votta K Kaeleb Emond, NP       [1] No Known Allergies [2]  Current Outpatient Medications on File Prior to Visit  Medication Sig Dispense Refill   atorvastatin  (LIPITOR) 40 MG tablet Take 1 tablet (40 mg total) by mouth daily. 90 tablet 2   CONTOUR NEXT TEST test strip Use as instructed 100 strip 2   empagliflozin  (JARDIANCE ) 10 MG TABS tablet Take 1 tablet (10 mg total) by mouth daily. for diabetes. 90 tablet 1   fenofibrate  160 MG tablet TAKE 1 TABLET(160 MG) BY MOUTH DAILY FOR CHOLESTEROL 90 tablet 2   Insulin  Pen Needle (CAREFINE PEN NEEDLES) 31G X 8 MM MISC Use with insulin  once nightly at bedtime. 100 each 3   Lancets MISC Use as instructed to test blood sugar daily 100 each 0   losartan  (COZAAR ) 100 MG tablet TAKE 1 TABLET BY MOUTH EVERY DAY FOR BLOOD PRESSURE 90 tablet 2   Semaglutide , 2 MG/DOSE, 8 MG/3ML SOPN Inject 2 mg as  directed once a week. for diabetes. 12 mL 1   tamsulosin  (FLOMAX ) 0.4 MG CAPS capsule Take 1 capsule (0.4 mg total) by mouth daily after breakfast. For urine flow and frequency 90 capsule 1   No current facility-administered medications on file prior to visit.   "

## 2024-06-29 NOTE — Patient Instructions (Addendum)
 The freestyle libre monitor will not be covered as you are not on insulin .  We will try the Dexcom continuous glucose monitor.  I sent a prescription to your pharmacy.  I would like to stop Ozempic  and switch you back to Mounjaro  for diabetes.  You did much better when you were taking Mounjaro .  Start tirzepitide (Mounjaro ) for diabetes/weight loss. Start by injecting 2.5 mg into the skin once weekly for 4 weeks, then increase to 5 mg once weekly thereafter. Please notify me once you've used your last 2.5 mg pen so that I can prescribe the next dose.   Please work on m.d.c. holdings as discussed.  Please schedule a physical to meet with me in 3 months.   I will be in touch again soon  It was a pleasure to see you today!

## 2024-06-29 NOTE — Addendum Note (Signed)
 Addended by: ISADORA RAISIN on: 06/29/2024 02:29 PM   Modules accepted: Orders

## 2024-06-29 NOTE — Assessment & Plan Note (Addendum)
 Ultimately no improvement compared to last visit with A1c of 8.4.  We discussed that his glucose levels are largely secondary to his diet as he drinks nearly a gallon of milk every 2 days and eats sweets twice daily.  There is also no regular exercise.  Will repeat renal function today. Will switch from Ozempic  to Mounjaro  as he did well on this previously.  Start tirzepitide (Mounjaro ) for diabetes/weight loss. Start by injecting 2.5 mg into the skin once weekly for 4 weeks, then increase to 5 mg once weekly thereafter.   Continue Jardiance  10 mg daily. Consider increasing to 25 mg daily.  Await renal function. Continue glipizide  XL 10 mg daily, metformin  ER 1000 mg daily.  He declines to increase metformin  dose.  We also discussed the importance of checking glucose levels.  We will attempt Dexcom CGM to see if this will be covered under his insurance as he relies on insulin  to control glucose levels.  Follow-up in 3 months.

## 2024-06-30 LAB — BASIC METABOLIC PANEL WITH GFR
BUN: 25 mg/dL (ref 7–25)
CO2: 24 mmol/L (ref 20–32)
Calcium: 9.5 mg/dL (ref 8.6–10.3)
Chloride: 101 mmol/L (ref 98–110)
Creat: 1.17 mg/dL (ref 0.70–1.35)
Glucose, Bld: 381 mg/dL — ABNORMAL HIGH (ref 65–99)
Potassium: 4.1 mmol/L (ref 3.5–5.3)
Sodium: 136 mmol/L (ref 135–146)
eGFR: 67 mL/min/1.73m2 (ref >=60)

## 2024-07-02 ENCOUNTER — Ambulatory Visit: Payer: Self-pay | Admitting: Primary Care

## 2024-07-13 ENCOUNTER — Encounter: Payer: Medicare Other | Admitting: Primary Care

## 2024-07-16 ENCOUNTER — Other Ambulatory Visit: Payer: Self-pay | Admitting: *Deleted

## 2024-07-16 DIAGNOSIS — R079 Chest pain, unspecified: Secondary | ICD-10-CM

## 2024-07-18 MED ORDER — FAMOTIDINE 20 MG PO TABS: 20.0000 mg | ORAL_TABLET | Freq: Every day | ORAL | 0 refills | Status: AC

## 2024-07-25 ENCOUNTER — Other Ambulatory Visit: Payer: Self-pay | Admitting: Primary Care

## 2024-07-25 DIAGNOSIS — Z794 Long term (current) use of insulin: Secondary | ICD-10-CM

## 2024-07-25 NOTE — Telephone Encounter (Unsigned)
 Copied from CRM [id number]. Topic: Clinical - Medication Refill >> Jul 25, 2024  3:24 PM Eva FALCON wrote: Medication:  metFORMIN  (GLUCOPHAGE -XR) 500 MG 24 hr tablet glipiZIDE  (GLUCOTROL  XL) 10 MG 24 hr tablet   Has the patient contacted their pharmacy? Yes, state they've sent twice (Agent: If no, request that the patient contact the pharmacy for the refill. If patient does not wish to contact the pharmacy document the reason why and proceed with request.) (Agent: If yes, when and what did the pharmacy advise?)  This is the patient's preferred pharmacy:  [facility] [phone] - [place], [place] - [address] AT [facility] [address] [place] [address] Phone: [phone] Fax: [phone]  Is this the correct pharmacy for this prescription? Yes If no, delete pharmacy and type the correct one.   Has the prescription been filled recently? No  Is the patient out of the medication? Yes  Has the patient been seen for an appointment in the last year OR does the patient have an upcoming appointment? Yes  Can we respond through MyChart? No  Agent: Please be advised that Rx refills may take up to 3 business days. We ask that you follow-up with your pharmacy.

## 2024-07-26 ENCOUNTER — Telehealth: Payer: Self-pay

## 2024-07-26 MED ORDER — GLIPIZIDE ER 10 MG PO TB24: 10.0000 mg | ORAL_TABLET | Freq: Every day | ORAL | 1 refills | Status: AC

## 2024-07-26 MED ORDER — METFORMIN HCL ER 500 MG PO TB24: 1000.0000 mg | ORAL_TABLET | Freq: Every day | ORAL | 1 refills | Status: AC

## 2024-07-26 NOTE — Telephone Encounter (Signed)
 Copied from CRM [id number]. Topic: Clinical - Medication Question >> Jul 26, 2024  2:47 PM Aisha D wrote: Reason for CRM: Pt stated that his insurance no longer covers the Libre glucose sensor and needs to have another sensor prescribed that would be covered under his insurance. Pt stated that the insurance will cover the Dexcom G7 or G6. Pt would like a callback with an update.

## 2024-07-26 NOTE — Telephone Encounter (Signed)
 Patient is due for diabetes follow up in mid March, this will be required prior to any further refills.  Please schedule, thank you!

## 2024-07-26 NOTE — Telephone Encounter (Signed)
 According to chart review I already sent Dexcom 7 on 06/29/24. Please have him call his pharmacy.

## 2024-07-27 NOTE — Telephone Encounter (Signed)
 Spoke with pt relaying Kate's message. Pt verbalizes understanding and will contact [employer].

## 2024-08-08 ENCOUNTER — Other Ambulatory Visit (HOSPITAL_COMMUNITY): Payer: Self-pay

## 2024-08-08 ENCOUNTER — Telehealth: Payer: Self-pay

## 2024-08-08 NOTE — Telephone Encounter (Signed)
 To help increase the likelihood of successful prior authorization for a continuous glucose monitor, please review the documentation and include evidence of at least two hypoglycemic events (insurers often require glucose values <=54 mg/dL) and/or clear documentation that the patient relies on insulin to adequately control their diabetes. Thank you for your partnership.

## 2024-08-09 ENCOUNTER — Other Ambulatory Visit: Payer: Self-pay | Admitting: Primary Care

## 2024-08-09 DIAGNOSIS — E1165 Type 2 diabetes mellitus with hyperglycemia: Secondary | ICD-10-CM

## 2024-08-09 NOTE — Telephone Encounter (Signed)
 Great! I created an addendum to my office notes from 06/29/24. Hopefully this will help.

## 2024-08-10 NOTE — Telephone Encounter (Signed)
 Please call patient:  Did he ever start Mounjaro  medication for diabetes? If so, how many 2.5 mg pens does he have remaining? Looks like we need to increase the dose to 5 mg.  Also, schedule for CPE In late March.

## 2024-08-10 NOTE — Telephone Encounter (Signed)
 Spoke with pt asking about Mounjaro  use. States he did start it and has a pack of 4 pens left of 2.5 mg. Encouraged pt to call Mallie for refill when on last pen so she can send rx for 5 mg. Pt verbalizes understanding.   Also, schedule CPE on 10/16/24 at 2:00.

## 2024-08-10 NOTE — Telephone Encounter (Signed)
 Noted.

## 2024-08-14 ENCOUNTER — Telehealth: Payer: Self-pay

## 2024-08-14 ENCOUNTER — Other Ambulatory Visit: Payer: Self-pay | Admitting: Primary Care

## 2024-08-14 DIAGNOSIS — E1165 Type 2 diabetes mellitus with hyperglycemia: Secondary | ICD-10-CM

## 2024-08-14 NOTE — Telephone Encounter (Signed)
 Copied from CRM [id number]. Topic: Clinical - Prescription Issue >> Aug 14, 2024  2:34 PM Wess RAMAN wrote: Reason for CRM: Patient's insurance will not cover tirzepatide  (MOUNJARO ) 2.5 MG/0.5ML Pen, Continuous Glucose Receiver (DEXCOM G7 RECEIVER) DEVI, and Continuous Glucose Sensor (DEXCOM G7 SENSOR) MIS. He would like something else called in  Callback #: [id number]  Pharmacy: [facility] [phone] - [place], [place] - [address] AT [facility] OF [address] [place] [address] Phone: [phone] Fax: [phone] Hours: Not open 24 hours

## 2024-08-15 NOTE — Telephone Encounter (Signed)
 Do we know why his insurance will not cover Mounjaro  for diabetes or Dexcom for glucose monitoring? Do they prefer Ozempic  or Trulicity  instead of Mounjaro ? What about Freestyle Libre sensors?  What do we need to do go get these approved?

## 2024-08-16 ENCOUNTER — Other Ambulatory Visit (HOSPITAL_COMMUNITY): Payer: Self-pay

## 2024-10-12 ENCOUNTER — Ambulatory Visit

## 2024-10-16 ENCOUNTER — Encounter: Admitting: Primary Care
